# Patient Record
Sex: Male | Born: 1935 | Race: White | Hispanic: No | Smoking: Former smoker
Health system: Southern US, Community
[De-identification: ages and names within clinical notes are randomized; demographics above are authoritative.]

## PROBLEM LIST (undated history)

## (undated) DIAGNOSIS — Z9889 Other specified postprocedural states: Secondary | ICD-10-CM

## (undated) DIAGNOSIS — E7800 Pure hypercholesterolemia, unspecified: Secondary | ICD-10-CM

## (undated) DIAGNOSIS — I2510 Atherosclerotic heart disease of native coronary artery without angina pectoris: Secondary | ICD-10-CM

## (undated) DIAGNOSIS — G8929 Other chronic pain: Secondary | ICD-10-CM

## (undated) DIAGNOSIS — E039 Hypothyroidism, unspecified: Secondary | ICD-10-CM

## (undated) DIAGNOSIS — R0609 Other forms of dyspnea: Secondary | ICD-10-CM

## (undated) DIAGNOSIS — C61 Malignant neoplasm of prostate: Secondary | ICD-10-CM

## (undated) DIAGNOSIS — R296 Repeated falls: Secondary | ICD-10-CM

## (undated) DIAGNOSIS — M1990 Unspecified osteoarthritis, unspecified site: Secondary | ICD-10-CM

## (undated) DIAGNOSIS — M545 Low back pain, unspecified: Secondary | ICD-10-CM

## (undated) DIAGNOSIS — G4733 Obstructive sleep apnea (adult) (pediatric): Secondary | ICD-10-CM

## (undated) DIAGNOSIS — I10 Essential (primary) hypertension: Secondary | ICD-10-CM

## (undated) DIAGNOSIS — J449 Chronic obstructive pulmonary disease, unspecified: Secondary | ICD-10-CM

## (undated) DIAGNOSIS — F419 Anxiety disorder, unspecified: Secondary | ICD-10-CM

## (undated) DIAGNOSIS — M109 Gout, unspecified: Secondary | ICD-10-CM

## (undated) DIAGNOSIS — Z9289 Personal history of other medical treatment: Secondary | ICD-10-CM

## (undated) DIAGNOSIS — R32 Unspecified urinary incontinence: Secondary | ICD-10-CM

## (undated) DIAGNOSIS — F99 Mental disorder, not otherwise specified: Secondary | ICD-10-CM

## (undated) HISTORY — PX: PROSTATECTOMY: SHX69

## (undated) HISTORY — PX: APPENDECTOMY: SHX54

## (undated) HISTORY — PX: CATARACT EXTRACTION W/ INTRAOCULAR LENS  IMPLANT, BILATERAL: SHX1307

## (undated) HISTORY — PX: HERNIA REPAIR: SHX51

## (undated) HISTORY — PX: CORONARY ARTERY BYPASS GRAFT: SHX141

---

## 1986-10-29 DIAGNOSIS — C61 Malignant neoplasm of prostate: Secondary | ICD-10-CM

## 1986-10-29 HISTORY — DX: Malignant neoplasm of prostate: C61

## 1992-10-29 DIAGNOSIS — Z9889 Other specified postprocedural states: Secondary | ICD-10-CM

## 1992-10-29 HISTORY — DX: Other specified postprocedural states: Z98.890

## 1998-09-14 ENCOUNTER — Encounter: Payer: Self-pay | Admitting: Surgery

## 1998-09-16 ENCOUNTER — Ambulatory Visit (HOSPITAL_COMMUNITY): Admission: RE | Admit: 1998-09-16 | Discharge: 1998-09-17 | Payer: Self-pay | Admitting: Surgery

## 1999-01-06 ENCOUNTER — Ambulatory Visit (HOSPITAL_COMMUNITY): Admission: RE | Admit: 1999-01-06 | Discharge: 1999-01-06 | Payer: Self-pay | Admitting: Internal Medicine

## 1999-10-30 HISTORY — PX: ORCHIECTOMY: SHX2116

## 1999-10-30 HISTORY — PX: CIRCUMCISION: SUR203

## 1999-10-30 HISTORY — PX: PENILE PROSTHESIS  REMOVAL: SHX2202

## 1999-12-14 ENCOUNTER — Encounter: Admission: RE | Admit: 1999-12-14 | Discharge: 1999-12-14 | Payer: Self-pay | Admitting: Urology

## 1999-12-14 ENCOUNTER — Encounter: Payer: Self-pay | Admitting: Urology

## 2000-07-29 ENCOUNTER — Encounter (INDEPENDENT_AMBULATORY_CARE_PROVIDER_SITE_OTHER): Payer: Self-pay | Admitting: Specialist

## 2000-07-29 ENCOUNTER — Observation Stay (HOSPITAL_COMMUNITY): Admission: RE | Admit: 2000-07-29 | Discharge: 2000-07-30 | Payer: Self-pay | Admitting: Urology

## 2001-08-25 ENCOUNTER — Encounter (INDEPENDENT_AMBULATORY_CARE_PROVIDER_SITE_OTHER): Payer: Self-pay

## 2001-08-25 ENCOUNTER — Ambulatory Visit (HOSPITAL_COMMUNITY): Admission: RE | Admit: 2001-08-25 | Discharge: 2001-08-25 | Payer: Self-pay | Admitting: Gastroenterology

## 2002-10-19 ENCOUNTER — Encounter: Payer: Self-pay | Admitting: Urology

## 2002-10-19 ENCOUNTER — Encounter: Admission: RE | Admit: 2002-10-19 | Discharge: 2002-10-19 | Payer: Self-pay | Admitting: Urology

## 2002-10-29 HISTORY — PX: CARDIAC VALVE REPLACEMENT: SHX585

## 2003-05-01 ENCOUNTER — Inpatient Hospital Stay (HOSPITAL_COMMUNITY): Admission: AD | Admit: 2003-05-01 | Discharge: 2003-05-16 | Payer: Self-pay | Admitting: Interventional Cardiology

## 2003-05-05 ENCOUNTER — Encounter: Payer: Self-pay | Admitting: Surgery

## 2003-05-06 ENCOUNTER — Encounter: Payer: Self-pay | Admitting: Surgery

## 2003-05-06 ENCOUNTER — Encounter (INDEPENDENT_AMBULATORY_CARE_PROVIDER_SITE_OTHER): Payer: Self-pay | Admitting: Specialist

## 2003-05-07 ENCOUNTER — Encounter: Payer: Self-pay | Admitting: Surgery

## 2003-05-08 ENCOUNTER — Encounter: Payer: Self-pay | Admitting: Surgery

## 2003-05-25 ENCOUNTER — Encounter: Admission: RE | Admit: 2003-05-25 | Discharge: 2003-05-25 | Payer: Self-pay | Admitting: Surgery

## 2003-05-25 ENCOUNTER — Encounter: Payer: Self-pay | Admitting: Surgery

## 2003-06-07 ENCOUNTER — Encounter (HOSPITAL_COMMUNITY): Admission: RE | Admit: 2003-06-07 | Discharge: 2003-09-05 | Payer: Self-pay | Admitting: Interventional Cardiology

## 2003-09-06 ENCOUNTER — Encounter (HOSPITAL_COMMUNITY): Admission: RE | Admit: 2003-09-06 | Discharge: 2003-12-05 | Payer: Self-pay | Admitting: Interventional Cardiology

## 2003-09-14 ENCOUNTER — Emergency Department (HOSPITAL_COMMUNITY): Admission: EM | Admit: 2003-09-14 | Discharge: 2003-09-14 | Payer: Self-pay | Admitting: Emergency Medicine

## 2003-09-24 ENCOUNTER — Emergency Department (HOSPITAL_COMMUNITY): Admission: EM | Admit: 2003-09-24 | Discharge: 2003-09-24 | Payer: Self-pay | Admitting: Emergency Medicine

## 2003-09-25 ENCOUNTER — Ambulatory Visit (HOSPITAL_COMMUNITY): Admission: RE | Admit: 2003-09-25 | Discharge: 2003-09-25 | Payer: Self-pay | Admitting: Neurology

## 2003-11-23 ENCOUNTER — Ambulatory Visit (HOSPITAL_COMMUNITY): Admission: RE | Admit: 2003-11-23 | Discharge: 2003-11-23 | Payer: Self-pay | Admitting: Urology

## 2003-11-26 ENCOUNTER — Encounter: Admission: RE | Admit: 2003-11-26 | Discharge: 2003-11-26 | Payer: Self-pay | Admitting: Internal Medicine

## 2004-05-15 ENCOUNTER — Ambulatory Visit: Admission: RE | Admit: 2004-05-15 | Discharge: 2004-07-13 | Payer: Self-pay

## 2004-08-23 ENCOUNTER — Ambulatory Visit (HOSPITAL_BASED_OUTPATIENT_CLINIC_OR_DEPARTMENT_OTHER): Admission: RE | Admit: 2004-08-23 | Discharge: 2004-08-23 | Payer: Self-pay | Admitting: Neurology

## 2004-12-01 ENCOUNTER — Ambulatory Visit (HOSPITAL_COMMUNITY): Admission: RE | Admit: 2004-12-01 | Discharge: 2004-12-01 | Payer: Self-pay | Admitting: Urology

## 2005-04-08 ENCOUNTER — Encounter: Admission: RE | Admit: 2005-04-08 | Discharge: 2005-04-08 | Payer: Self-pay | Admitting: Internal Medicine

## 2005-06-26 ENCOUNTER — Inpatient Hospital Stay (HOSPITAL_COMMUNITY): Admission: EM | Admit: 2005-06-26 | Discharge: 2005-07-03 | Payer: Self-pay | Admitting: Emergency Medicine

## 2005-06-27 ENCOUNTER — Encounter (INDEPENDENT_AMBULATORY_CARE_PROVIDER_SITE_OTHER): Payer: Self-pay | Admitting: Cardiology

## 2005-07-11 ENCOUNTER — Encounter: Admission: RE | Admit: 2005-07-11 | Discharge: 2005-07-24 | Payer: Self-pay | Admitting: Emergency Medicine

## 2006-10-04 ENCOUNTER — Encounter: Admission: RE | Admit: 2006-10-04 | Discharge: 2006-10-04 | Payer: Self-pay | Admitting: Neurology

## 2006-11-28 ENCOUNTER — Encounter: Admission: RE | Admit: 2006-11-28 | Discharge: 2006-12-25 | Payer: Self-pay | Admitting: Neurology

## 2006-12-26 ENCOUNTER — Encounter: Admission: RE | Admit: 2006-12-26 | Discharge: 2007-01-22 | Payer: Self-pay | Admitting: Neurology

## 2007-01-23 ENCOUNTER — Encounter: Admission: RE | Admit: 2007-01-23 | Discharge: 2007-02-13 | Payer: Self-pay | Admitting: Neurology

## 2007-06-03 ENCOUNTER — Encounter: Admission: RE | Admit: 2007-06-03 | Discharge: 2007-09-01 | Payer: Self-pay | Admitting: Neurology

## 2008-01-13 ENCOUNTER — Encounter: Admission: RE | Admit: 2008-01-13 | Discharge: 2008-04-12 | Payer: Self-pay | Admitting: Internal Medicine

## 2008-02-20 ENCOUNTER — Encounter: Payer: Self-pay | Admitting: Internal Medicine

## 2008-02-24 ENCOUNTER — Encounter: Payer: Self-pay | Admitting: Internal Medicine

## 2008-02-24 ENCOUNTER — Ambulatory Visit (HOSPITAL_COMMUNITY): Admission: RE | Admit: 2008-02-24 | Discharge: 2008-02-24 | Payer: Self-pay | Admitting: Interventional Cardiology

## 2008-03-19 ENCOUNTER — Ambulatory Visit: Payer: Self-pay | Admitting: Internal Medicine

## 2008-03-19 DIAGNOSIS — I10 Essential (primary) hypertension: Secondary | ICD-10-CM

## 2008-03-19 DIAGNOSIS — I209 Angina pectoris, unspecified: Secondary | ICD-10-CM

## 2008-03-19 DIAGNOSIS — R0609 Other forms of dyspnea: Secondary | ICD-10-CM

## 2008-03-19 DIAGNOSIS — E785 Hyperlipidemia, unspecified: Secondary | ICD-10-CM | POA: Insufficient documentation

## 2008-03-19 DIAGNOSIS — R0989 Other specified symptoms and signs involving the circulatory and respiratory systems: Secondary | ICD-10-CM

## 2008-03-19 DIAGNOSIS — Z8679 Personal history of other diseases of the circulatory system: Secondary | ICD-10-CM | POA: Insufficient documentation

## 2008-03-26 ENCOUNTER — Ambulatory Visit: Payer: Self-pay | Admitting: Internal Medicine

## 2008-03-29 LAB — CONVERTED CEMR LAB
BUN: 18 mg/dL (ref 6–23)
Basophils Relative: 2 % — ABNORMAL HIGH (ref 0.0–1.0)
Calcium: 9 mg/dL (ref 8.4–10.5)
Creatinine, Ser: 1.1 mg/dL (ref 0.4–1.5)
Eosinophils Relative: 11 % — ABNORMAL HIGH (ref 0.0–5.0)
GFR calc Af Amer: 82 mL/min
Glucose, Bld: 97 mg/dL (ref 70–99)
Lymphocytes Relative: 21 % (ref 12.0–46.0)
Monocytes Relative: 6 % (ref 3.0–12.0)
Neutrophils Relative %: 60 % (ref 43.0–77.0)
Potassium: 5 meq/L (ref 3.5–5.1)
RBC: 4.02 M/uL — ABNORMAL LOW (ref 4.22–5.81)
WBC: 11 10*3/uL — ABNORMAL HIGH (ref 4.5–10.5)

## 2008-04-12 ENCOUNTER — Ambulatory Visit: Payer: Self-pay | Admitting: Internal Medicine

## 2008-04-12 DIAGNOSIS — R05 Cough: Secondary | ICD-10-CM

## 2008-04-15 ENCOUNTER — Ambulatory Visit: Payer: Self-pay | Admitting: Cardiology

## 2008-04-26 ENCOUNTER — Ambulatory Visit: Payer: Self-pay | Admitting: Internal Medicine

## 2008-05-19 ENCOUNTER — Encounter: Admission: RE | Admit: 2008-05-19 | Discharge: 2008-05-19 | Payer: Self-pay | Admitting: Geriatric Medicine

## 2008-08-18 ENCOUNTER — Encounter: Admission: RE | Admit: 2008-08-18 | Discharge: 2008-08-18 | Payer: Self-pay | Admitting: Interventional Cardiology

## 2008-08-26 ENCOUNTER — Ambulatory Visit: Payer: Self-pay | Admitting: Surgery

## 2008-09-21 ENCOUNTER — Ambulatory Visit: Payer: Self-pay | Admitting: Surgery

## 2008-09-21 ENCOUNTER — Ambulatory Visit (HOSPITAL_COMMUNITY): Admission: RE | Admit: 2008-09-21 | Discharge: 2008-09-21 | Payer: Self-pay | Admitting: Surgery

## 2008-09-29 ENCOUNTER — Inpatient Hospital Stay (HOSPITAL_COMMUNITY): Admission: RE | Admit: 2008-09-29 | Discharge: 2008-10-07 | Payer: Self-pay | Admitting: Surgery

## 2008-10-01 ENCOUNTER — Ambulatory Visit: Payer: Self-pay | Admitting: Physical Medicine & Rehabilitation

## 2008-11-01 ENCOUNTER — Ambulatory Visit: Payer: Self-pay | Admitting: Surgery

## 2008-11-01 ENCOUNTER — Encounter: Admission: RE | Admit: 2008-11-01 | Discharge: 2008-11-01 | Payer: Self-pay | Admitting: Surgery

## 2008-11-11 ENCOUNTER — Emergency Department (HOSPITAL_COMMUNITY): Admission: EM | Admit: 2008-11-11 | Discharge: 2008-11-11 | Payer: Self-pay | Admitting: Emergency Medicine

## 2009-02-07 ENCOUNTER — Encounter: Admission: RE | Admit: 2009-02-07 | Discharge: 2009-02-07 | Payer: Self-pay | Admitting: Surgery

## 2009-02-07 ENCOUNTER — Ambulatory Visit: Payer: Self-pay | Admitting: Surgery

## 2009-03-03 ENCOUNTER — Encounter: Admission: RE | Admit: 2009-03-03 | Discharge: 2009-03-03 | Payer: Self-pay | Admitting: Interventional Cardiology

## 2009-03-07 ENCOUNTER — Inpatient Hospital Stay (HOSPITAL_BASED_OUTPATIENT_CLINIC_OR_DEPARTMENT_OTHER): Admission: RE | Admit: 2009-03-07 | Discharge: 2009-03-07 | Payer: Self-pay | Admitting: Interventional Cardiology

## 2009-03-10 ENCOUNTER — Encounter: Admission: RE | Admit: 2009-03-10 | Discharge: 2009-03-10 | Payer: Self-pay | Admitting: Interventional Cardiology

## 2009-03-24 ENCOUNTER — Ambulatory Visit (HOSPITAL_COMMUNITY): Admission: RE | Admit: 2009-03-24 | Discharge: 2009-03-24 | Payer: Self-pay | Admitting: Interventional Cardiology

## 2009-05-05 ENCOUNTER — Inpatient Hospital Stay (HOSPITAL_COMMUNITY): Admission: AD | Admit: 2009-05-05 | Discharge: 2009-05-06 | Payer: Self-pay | Admitting: Internal Medicine

## 2009-05-05 ENCOUNTER — Encounter (INDEPENDENT_AMBULATORY_CARE_PROVIDER_SITE_OTHER): Payer: Self-pay | Admitting: Internal Medicine

## 2009-05-05 ENCOUNTER — Ambulatory Visit: Payer: Self-pay | Admitting: Surgery

## 2009-05-09 ENCOUNTER — Ambulatory Visit: Payer: Self-pay | Admitting: Surgery

## 2009-08-12 ENCOUNTER — Encounter: Admission: RE | Admit: 2009-08-12 | Discharge: 2009-08-12 | Payer: Self-pay | Admitting: Surgery

## 2009-08-15 ENCOUNTER — Ambulatory Visit: Payer: Self-pay | Admitting: Surgery

## 2009-12-23 ENCOUNTER — Inpatient Hospital Stay (HOSPITAL_COMMUNITY): Admission: AD | Admit: 2009-12-23 | Discharge: 2009-12-27 | Payer: Self-pay | Admitting: Internal Medicine

## 2010-05-17 ENCOUNTER — Ambulatory Visit: Payer: Self-pay | Admitting: Internal Medicine

## 2010-06-19 ENCOUNTER — Telehealth: Payer: Self-pay | Admitting: Internal Medicine

## 2010-06-27 ENCOUNTER — Ambulatory Visit: Payer: Self-pay | Admitting: Cardiovascular Disease

## 2010-06-27 ENCOUNTER — Inpatient Hospital Stay (HOSPITAL_COMMUNITY)
Admission: EM | Admit: 2010-06-27 | Discharge: 2010-07-07 | Disposition: A | Discharge status: Rehabilitation Facility | Payer: Self-pay | Source: Home / Self Care | Admitting: Emergency Medicine

## 2010-06-28 ENCOUNTER — Encounter (INDEPENDENT_AMBULATORY_CARE_PROVIDER_SITE_OTHER): Payer: Self-pay | Admitting: Internal Medicine

## 2010-07-06 ENCOUNTER — Ambulatory Visit: Payer: Self-pay | Admitting: Physical Medicine & Rehabilitation

## 2010-07-07 ENCOUNTER — Ambulatory Visit: Payer: Self-pay | Admitting: Physical Medicine & Rehabilitation

## 2010-07-07 ENCOUNTER — Inpatient Hospital Stay (HOSPITAL_COMMUNITY)
Admission: RE | Admit: 2010-07-07 | Discharge: 2010-07-17 | Payer: Self-pay | Admitting: Physical Medicine & Rehabilitation

## 2010-07-17 ENCOUNTER — Ambulatory Visit: Payer: Self-pay | Admitting: Critical Care Medicine

## 2010-07-17 ENCOUNTER — Inpatient Hospital Stay (HOSPITAL_COMMUNITY): Admission: AD | Admit: 2010-07-17 | Discharge: 2010-07-26 | Payer: Self-pay | Admitting: Critical Care Medicine

## 2010-07-18 ENCOUNTER — Encounter: Payer: Self-pay | Admitting: Critical Care Medicine

## 2010-11-01 ENCOUNTER — Ambulatory Visit
Admission: RE | Admit: 2010-11-01 | Discharge: 2010-11-01 | Payer: Self-pay | Source: Home / Self Care | Attending: Internal Medicine | Admitting: Internal Medicine

## 2010-11-17 ENCOUNTER — Ambulatory Visit
Admission: RE | Admit: 2010-11-17 | Discharge: 2010-11-17 | Payer: Self-pay | Source: Home / Self Care | Attending: Internal Medicine | Admitting: Internal Medicine

## 2010-11-18 ENCOUNTER — Encounter: Payer: Self-pay | Admitting: Surgery

## 2010-11-19 ENCOUNTER — Encounter: Payer: Self-pay | Admitting: Surgery

## 2010-11-28 NOTE — Assessment & Plan Note (Signed)
Summary: Pulmonary/ ext ov f/u sob/cough   Primary Provider/Referring Provider:  Kevan Ny  CC:  Acute visit.  Pt c/o cough x 2-3 wks- worse at bedtime when lies down.  Cough is mainly dry- sometimes prod with minimal clear to white sputum.  He also c/o SOB with exertion such as walking approx 50 ft.  He sleeps all day and has no energy.  Marland Kitchen  History of Present Illness: 85  yowm quit smoking 1990  history of HTN. Seen 03/19/08   for increased DOE over last 6 months. Pt complains that he has to  stops half way to mailbox to catch breath x years especially last 6 month plus dry cough ? better with advair sleeping ok except for leg cramps which also occur durng walk, = bilaterally. No activity desaturations last visit. PFT  wnl  - no evidence of airflow obst . Advair was stopped. and trial of Kapidex for possible occult LPR/Gerd for hoarseness and cough was rec   03/26/08  ov for med review. Confused last visit with meds. continues to have DOE, leg cramps and leg give out on him with activity.  rec short course of prednisone and as needed tramadol for cough    04/12/08  given short course of prednisone complete 6/23 plus  tramadol and it stopped and hasn't come back and no longer needing any cough suppressants on ppi daily. rec f/u as needed.  May 17, 2010 ov Acute visit.  Pt c/o cough x 2-3 wks- worse after supper at bedtime when lies down.  Cough is mainly dry- sometimes prod with minimal clear to white sputum.  He also c/o SOB with exertion going from house to MB which is about 100 ft and no different x sev years.   Eval by Dr Kevan Ny with cxr "ok" .  previously better transiently on prednisone     Current Medications (verified): 1)  Oscal 500/200 D-3 500-200 Mg-Unit  Tabs (Calcium-Vitamin D) .... Two Times A Day 2)  Allopurinol 100 Mg  Tabs (Allopurinol) .... Once Daily 3)  Adult Aspirin Low Strength 81 Mg  Tbdp (Aspirin) .... Once Daily 4)  Cymbalta 30 Mg  Cpep (Duloxetine Hcl) .... Two Times A  Day 5)  Namenda 10 Mg  Tabs (Memantine Hcl) .... Two Times A Day 6)  Zetia 10 Mg  Tabs (Ezetimibe) .... Take 1 Tablet By Mouth Once A Day 7)  Glycopyrrolate 2 Mg  Tabs (Glycopyrrolate) .... Take 1 Tab By Mouth At Bedtime 8)  Megestrol Acetate 40 Mg  Tabs (Megestrol Acetate) .... Take 1 Tablet By Mouth Once A Day As Needed 9)  Aricept 10 Mg  Tabs (Donepezil Hcl) .... Once Daily 10)  Qualaquin 324 Mg  Caps (Quinine Sulfate) .... Take 1 Tablet By Mouth Once A Day As Needed 11)  Valium 10 Mg Tabs (Diazepam) .Marland Kitchen.. 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 wt  189 > 211 May 17, 2010  HEENT: nl dentition, turbinates, and orophanx. Nl external ear canals without cough reflex Neck without JVD/Nodes/TM Lungs clear to A and P bilaterally without cough on insp or exp maneuvers RRR no s3 or murmur or increase in P2 Abd soft and benign with nl excursion in the supine position. No bruits or organomegaly Ext warm without calf tenderness, cyanosis clubbing or edema Skin warm and dry without lesions     Impression & Recommendations:  Problem # 1:  COUGH (ICD-786.2)  The most common causes of chronic cough in immunocompetent adults include: upper airway cough syndrome (UACS), previously referred to as postnasal drip syndrome,  caused by variety of  rhinosinus conditions; (2) asthma; (3) GERD; (4) chronic bronchitis from cigarette smoking or other inhaled environmental irritants; (5) nonasthmatic eosinophilic bronchitis; and (6) bronchiectasis. These conditions, singly or in combination, have accounted for up to 94% of the causes of chronic cough in prospective studies.   Based on previous eval this is likely  Classic Upper airway cough syndrome, so named because it's frequently impossible to sort out how much is  CR/sinusitis with freq throat clearing (which can be related to primary GERD)   vs  causing  secondary extra esophageal GERD from wide swings in gastric pressure that occur with throat clearing, promoting self use of mint and menthol lozenges that reduce the lower esophageal sphincter tone and exacerbate the problem further These are the same pts who not infrequently have failed to tolerate ace inhibitors,  dry powder inhalers or biphosphonates or report having reflux symptoms that don't respond to standard doses of PPI  Rec See instructions for specific recommendations   The standardized cough guidelines recently published in Chest are a 14 step process, not a single office visit,  and are intended  to address this problem logically,  with an alogrithm dependent on response to each progressive step  to determine a specific diagnosis with  minimal addtional testing needed. Therefore if compliance is an issue this empiric standardized approach simply won't work.   Orders: Est. Patient Level IV (14782) Prescription Created Electronically [id number])  Problem # 2:  DYSPNEA (ICD-786.09) Prev pft's nl and this was attributed to geriatric decline and note can now get to MB without stopping which is better than he was 2 years ago so work on the cough first then regroup with walking sat on next ov  Medications Added to Medication List This Visit: 1)  Megestrol Acetate 40 Mg Tabs (Megestrol acetate) .... Take 1 tablet by mouth once a day as  needed 2)  Valium 10 Mg Tabs (Diazepam) .Marland Kitchen.. 1 at bedtime 3)  Prednisone 10 Mg Tabs (Prednisone) .... 4 each am x 2days, 2x2days, 1x2days and stop  Patient Instructions: 1)  Prednisone 10 mg  4 each am x 2days, 2x2days, 1x2days and stop  2)  Add chortrimeton 4 mg at bedtime and pepcid 20 mg at bedtime 3)  Please schedule a follow-up appointment in 4 weeks, sooner if needed. 4)  GERD (REFLUX)  is a common cause of respiratory symptoms. It commonly presents without heartburn and can be treated with medication, but also with lifestyle changes including avoidance of late meals, excessive alcohol, smoking cessation, and avoid fatty  foods, chocolate, peppermint, colas, red wine, and acidic juices such as orange juice. NO MINT OR MENTHOL PRODUCTS SO NO COUGH DROPS  5)  USE SUGARLESS CANDY INSTEAD (jolley ranchers)  6)  NO OIL BASED VITAMINS  7)  Unlike when you get a prescription for eyeglasses, it's not possible to always walk out of this or any medical office with a perfect prescription that is immediately effective  based on any test that we offer here.  On the contrary, it may take several weeks for the full impact of changes recommened today - hopefully you will respond well.  If not, then we'll adjust your medication on your next visit accordingly, knowing more then than we can possibly know now.  8)  Please schedule a follow-up appointment in 4 weeks, sooner if needed    Prescriptions: PREDNISONE 10 MG  TABS (PREDNISONE) 4 each am x 2days, 2x2days, 1x2days and stop  #14 x 0   Entered and Authorized by:   Nyoka Cowden MD   Signed by:   Nyoka Cowden MD on 05/17/2010   Method used:   [facility]        [facility]  [address] [phone][facility] (retail)       [address]       [place], [place]  [id number]       Ph: [phone] or [phone]       Fax: [phone]   RxID:   [id number]

## 2010-11-28 NOTE — Progress Notes (Signed)
Summary: nos appt  Phone Note Call from Patient   Caller: Logan Allen  Call For: Logan Allen Summary of Call: In ref to nos from 8/19, pt states he will call to rsc. Initial call taken by: Darletta Moll,  June 19, 2010 9:58 AM     Appended Document: nos appt ok - chart review does not show recent cxr which I recommend be done this year so if not returning here make sure he sees Dr Kevan Ny for follow up, especially if still having symptoms  Appended Document: nos appt [name], not Shaune Pollack, is the referring physician  Appended Document: nos appt Spoke with pt.  He states that he is aware needs appt.  I advised that if not done here, needs cxr at Dr Kevan Ny office.  He states that he would like to come here for appt, but will have to call us back to get this sched for later this wk.  Pt states will call back tommorrow.

## 2010-11-30 NOTE — Assessment & Plan Note (Signed)
Summary: Pulmonary/ acute ext ov    Primary Provider/Referring Provider:  Kevan Ny  CC:  Dry cough .  History of Present Illness: 46  yowm quit smoking 1990  history of HTN. Seen 03/19/08  for increased DOE x  6 months to point where has  to  stop  half way to mailbox to catch breath x years  plus dry cough ? better with advair sleeping ok except for leg cramps which also occur durng walk, = bilaterally. No activity desaturations last visit. PFT  wnl  - no evidence of airflow obst . Advair was stopped. and trial of Kapidex for possible occult LPR/Gerd for hoarseness and cough was rec   03/26/08  ov for med review. Confused last visit with meds. continues to have DOE, leg cramps and leg give out on him with activity.  rec short course of prednisone and as needed tramadol for cough   04/12/08  given short course of prednisone complete 6/23 plus  tramadol and it stopped and hasn't come back and no longer needing any cough suppressants on ppi daily. rec f/u as needed.  May 17, 2010 ov Acute visit.  Pt c/o cough worse  x 2-3 wks- worse after supper at bedtime when lies down.  Cough is mainly dry- sometimes prod with minimal clear to white sputum.  He also c/o SOB with exertion going from house to MB which is about 100 ft and no different x sev years.   Eval by Dr Kevan Ny with cxr "ok" .  previously better transiently on prednisone. rec add h1   November 01, 2010 ov cc dry cough no better, using neb, only reprieve is around 7-9am daily, overall worse x one month, sinuses are fine and no trouble swallowing food, had G tube but now out. Pt denies any significant sore throat, dysphagia, itching, sneezing,  nasal congestion or excess secretions,  fever, chills, sweats, unintended wt loss, pleuritic or exertional cp, hempoptysis, change in activity tolerance  orthopnea pnd or leg swelling. Pt also denies any obvious fluctuation in symptoms with weather or environmental change or other alleviating or aggravating  factors.         Current Medications (verified): 1)  Allopurinol 100 Mg  Tabs (Allopurinol) .... Once Daily 2)  Adult Aspirin Low Strength 81 Mg  Tbdp (Aspirin) .... Once Daily 3)  Cymbalta 30 Mg  Cpep (Duloxetine Hcl) .... Two Times A Day 4)  Namenda 10 Mg  Tabs (Memantine Hcl) .... Two Times A Day 5)  Zetia 10 Mg  Tabs (Ezetimibe) .... Take 1 Tablet By Mouth Once A Day 6)  Aricept 10 Mg  Tabs (Donepezil Hcl) .... Once Daily 7)  Qualaquin 324 Mg  Caps (Quinine Sulfate) .... Take 1 Tablet By Mouth Once A Day As Needed 8)  Acetaminophen 325 Mg Tabs (Acetaminophen) .... 2 Every 4 Hrs As Needed 9)  Tramadol Hcl 50 Mg Tabs (Tramadol Hcl) .... 1/2 Every 6 Hrs As Needed For Pain 10)  Bicalutamide 50 Mg Tabs (Bicalutamide) .Marland Kitchen.. 1 Once Daily 11)  Calcium + D 600-200 Mg-Unit Tabs (Calcium Carbonate-Vitamin D) .Marland Kitchen.. 1 Two Times A Day 12)  Mucinex 600 Mg Xr12h-Tab (Guaifenesin) .Marland Kitchen.. 1  Two Times A Day 13)  Lumigan 0.01 % Soln (Bimatoprost) .Marland Kitchen.. 1 Drop Each Eye At Bedtime 14)  Oxybutynin Chloride 15 Mg Xr24h-Tab (Oxybutynin Chloride) .Marland Kitchen.. 1 Once Daily 15)  Klor-Con M20 20 Meq Cr-Tabs (Potassium Chloride Crys Cr) .Marland Kitchen.. 1 Once Daily 16)  Pramipexole Dihydrochloride 0.25 Mg  Tabs (Pramipexole Dihydrochloride) .Marland Kitchen.. 1 Two Times A Day 17)  Quinidine Gluconate Cr 324 Mg Cr-Tabs (Quinidine Gluconate) .Marland Kitchen.. 1 Once Daily 18)  Robitussin Dm 100-10 Mg/55ml Syrp (Dextromethorphan-Guaifenesin) .... 2 Tsp Every 6 Hrs As Needed 19)  Tamsulosin Hcl 0.4 Mg Caps (Tamsulosin Hcl) .Marland Kitchen.. 1 Once Daily 20)  Ipratropium-Albuterol 0.5-2.5 (3) Mg/66ml Soln (Ipratropium-Albuterol) .Marland Kitchen.. 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 no asthma or resp dz  Social History: Reviewed history from 03/19/2008 and no changes required. Patient states former smoker.  He smoked for 45 years and quit about 1988.  He smoked about 2 ppd. He is retired from [employer].  Vital Signs:  Patient profile:   77 year old male Weight:      194.13 pounds O2 Sat:      92 % on Room air Temp:     97.7 degrees F oral Pulse rate:   69 / minute  Vitals Entered By: Vernie Murders (November 01, 2010 11:18 AM)  O2 Flow:  Room air  Physical Exam  Additional Exam:  Elderly wm,  frail wc  bound wm with classic pseudowheeze resolves with purse lip maneuver  wt  189 > 211 May 17, 2010 > wt 194 November 01, 2010  HEENT: nl dentition, turbinates, and orophanx. Nl external ear canals without cough reflex Neck without JVD/Nodes/TM Lungs prominent pseudowheeze with dry crackles on insp RRR no s3 or murmur or increase in P2 Abd soft and benign with nl excursion in the supine position. No bruits or organomegaly Ext warm without calf tenderness, cyanosis clubbing or edema Skin warm and dry without lesions     Impression & Recommendations:  Problem # 1:  COUGH (ICD-786.2)  The most common causes of chronic cough in immunocompetent adults include: upper airway cough syndrome (UACS), previously referred to as postnasal drip syndrome,  caused by variety of rhinosinus conditions; (2) asthma; (3) GERD; (4) chronic bronchitis from cigarette smoking or other inhaled environmental irritants; (5) nonasthmatic eosinophilic bronchitis; and (6) bronchiectasis. These conditions, singly or in combination, have accounted for up to 94% of the causes of chronic cough in prospective studies.   The abn cxr suggests either ILD or bronchiectasis but both may be related to chronic GERD and Of the three most common causes of chronic cough, only one (GERD) can actually cause the other two (asthma and UACS) and perpetuate the cylce  of cough inducing airway trauma, inflammation, heightened sensitivity to reflux which is prompted by the cough itself via a cyclical mechanism.  This may partially respond to steroids and look like asthma and post nasal drainage but never erradicated completely unless the cough and the secondary reflux are eliminated, preferably both at the same time.     The standardized cough guidelines recently published in Chest are a 14 step process, not a single office visit,  and are intended  to address this problem logically,  with an alogrithm dependent on response to each progressive step  to determine a specific diagnosis with  minimal addtional testing needed. Therefore if compliance is an issue this empiric standardized approach simply won't work.   See instructions for specific recommendations  Medications Added to Medication List This Visit: 1)  Acetaminophen 325 Mg Tabs (Acetaminophen) .... 2 every 4 hrs as needed 2)  Tramadol Hcl 50 Mg Tabs (Tramadol hcl) .... 1/2 every 6 hrs as needed for pain 3)  Bicalutamide 50 Mg Tabs (Bicalutamide) .Marland Kitchen.. 1 once daily 4)  Calcium + D 600-200 Mg-unit Tabs (Calcium carbonate-vitamin d) .Marland Kitchen.. 1 two times a day 5)  Mucinex 600 Mg Xr12h-tab (Guaifenesin) .Marland Kitchen.. 1  two times a day 6)  Lumigan 0.01 % Soln (Bimatoprost) .Marland Kitchen.. 1 drop each eye at bedtime 7)  Oxybutynin Chloride 15 Mg Xr24h-tab (Oxybutynin chloride) .Marland Kitchen.. 1 once daily 8)  Klor-con M20 20 Meq Cr-tabs (Potassium chloride crys cr) .Marland Kitchen.. 1 once daily 9)  Pramipexole Dihydrochloride 0.25 Mg Tabs (Pramipexole dihydrochloride) .Marland Kitchen.. 1 two times a day 10)  Quinidine Gluconate Cr 324 Mg Cr-tabs (Quinidine gluconate) .Marland Kitchen.. 1 once daily 11)  Robitussin Dm 100-10 Mg/16ml Syrp (Dextromethorphan-guaifenesin) .... 2 tsp every 6 hrs as needed 12)  Tamsulosin Hcl 0.4 Mg Caps (Tamsulosin hcl) .Marland Kitchen.. 1 once daily 13)  Ipratropium-albuterol 0.5-2.5 (3) Mg/42ml Soln (Ipratropium-albuterol) .Marland Kitchen.. 1 vial in nebulizer every 6 hrs as  needed 14)  Pepcid 20 Mg Tabs (Famotidine) .... Take one by mouth at bedtime 15)  Prilosec Otc 20 Mg Tbec (Omeprazole magnesium) .... Take one 30-60 min before first and last meals of the day 16)  Chlor-trimeton 4 Mg Tabs (Chlorpheniramine maleate) .... One at bedtime 17)  Prednisone 10 Mg Tabs (Prednisone) .... 4 each am x 2 days,  3 x 2days, 2x2 days, and 1x2 days  Other Orders: T-2 View CXR (71020TC) Est. Patient Level IV (75643)  Patient Instructions: 1)  GERD (REFLUX)  is a common cause of respiratory symptoms. It commonly presents without heartburn and can be treated with medication, but also with lifestyle changes including avoidance of late meals, excessive alcohol, smoking cessation, and avoid fatty foods, chocolate, peppermint, colas, red wine, and acidic juices such as orange juice. NO MINT OR MENTHOL PRODUCTS SO NO COUGH DROPS  2)  USE SUGARLESS CANDY INSTEAD (jolley ranchers)  3)  NO OIL BASED VITAMINS  4)  Chlortrimeton 4mg  one at bedtime 5)  Prilosec 20 mg Take one 30-60 min before first and last meals of the day and Pepcid 20 mg one at bedtime along with chlortrimeton 4mg  at bedtime 6)  Prednisone 4 each am x 2 days,  3 x 2days, 2x2 days, and 1x2 days  7)  Please schedule a follow-up appointment in 2 weeks, sooner if needed  8)     Prescriptions: PREDNISONE 10 MG  TABS (PREDNISONE) 4 each am x 2 days,  3 x 2days, 2x2 days, and 1x2 days  #20 x 0   Entered and Authorized by:   Nyoka Cowden MD   Signed by:   Nyoka Cowden MD on 11/01/2010   Method used:   Print then Give to Patient   RxID:   [id number] PREDNISONE 10 MG  TABS (PREDNISONE) 4 each am x 2 days,  3 x 2days, 2x2 days, and 1x2 days  #20 x 0   Entered and Authorized by:   Nyoka Cowden MD   Signed by:   Nyoka Cowden MD on 11/01/2010   Method used:   [facility]        [facility]  [address] [phone][facility] (retail)       [address]       [place]  [place], [place]  [id number]        Ph: [phone] or [phone]       Fax: [phone]   RxID:   [id number]  [facility]ia.  She was informed of this and to pls disregard it.  Mia verbalized understanding. Gweneth Dimitri RN  November 01, 2010 12:04 PM

## 2010-11-30 NOTE — Assessment & Plan Note (Signed)
Summary: Pulmonary/ summary f/u ov   Primary Provider/Referring Provider:  Kevan Ny  CC:  Cough- the same.  History of Present Illness: 74  yowm quit smoking 1990  history of HTN. Seen 03/19/08  for increased DOE x  6 months to point where has  to  stop  half way to mailbox to catch breath x years  plus dry cough ? better with advair sleeping ok except for leg cramps which also occur durng walk, = bilaterally. No activity desaturations last visit. PFT  wnl  - no evidence of airflow obst . Advair was stopped. and trial of Kapidex for possible occult LPR/Gerd for hoarseness and cough was rec   03/26/08  ov for med review. Confused last visit with meds. continues to have DOE, leg cramps and leg give out on him with activity.  rec short course of prednisone and as needed tramadol for cough   04/12/08  given short course of prednisone complete 6/23 plus  tramadol and it stopped and hasn't come back and no longer needing any cough suppressants on ppi daily. rec f/u as needed.  May 17, 2010 ov Acute visit.  Pt c/o cough worse  x 2-3 wks- worse after supper at bedtime when lies down.  Cough is mainly dry- sometimes prod with minimal clear to white sputum.  He also c/o SOB with exertion going from house to [place] which is about 100 ft and no different x sev years.   Eval by Dr Kevan Ny with cxr "ok" .  previously better transiently on prednisone. rec add h1   November 01, 2010 ov cc dry cough no better, using neb, only reprieve is around 7-9am daily, overall worse x one month, sinuses are fine and no trouble swallowing food, had G tube but now out. rec GERD (REFLUX)  diet Chlortrimeton 4mg  one at bedtime Prilosec 20 mg Take one 30-60 min before first and last meals of the day and Pepcid 20 mg one at bedtime along with chlortrimeton 4mg  at bedtime Prednisone 4 each am x 2 days,  3 x 2days, 2x2 days, and 1x2 days    November 17, 2010 ov cc persistent cough rattling congested doesn't wake up at night some worse early  am/  does not feel nebulizer helps, no purulent sputum, no sob, sore throat, dysphagia, itching, sneezing,  nasal congestion or excess secretions,  fever, chills, sweats, cp, leg swelling.  Current Medications (verified): 1)  Allopurinol 100 Mg  Tabs (Allopurinol) .... Once Daily 2)  Adult Aspirin Low Strength 81 Mg  Tbdp (Aspirin) .... Once Daily 3)  Cymbalta 30 Mg  Cpep (Duloxetine Hcl) .... Two Times A Day 4)  Namenda 10 Mg  Tabs (Memantine Hcl) .... Two Times A Day 5)  Zetia 10 Mg  Tabs (Ezetimibe) .... Take 1 Tablet By Mouth Once A Day 6)  Aricept 10 Mg  Tabs (Donepezil Hcl) .... Once Daily 7)  Tylenol 8 Hour 650 Mg Cr-Tabs (Acetaminophen) .Marland Kitchen.. 1 Every 4 Hrs As Needed 8)  Tramadol Hcl 50 Mg Tabs (Tramadol Hcl) .... 1/2 Every 6 Hrs As Needed For Pain 9)  Bicalutamide 50 Mg Tabs (Bicalutamide) .Marland Kitchen.. 1 Once Daily 10)  Calcium + D 600-200 Mg-Unit Tabs (Calcium Carbonate-Vitamin D) .Marland Kitchen.. 1 Two Times A Day 11)  Mucinex Dm 30-600 Mg Xr12h-Tab (Dextromethorphan-Guaifenesin) .... Per Box Directions As Needed 12)  Lumigan 0.01 % Soln (Bimatoprost) .Marland Kitchen.. 1 Drop Each Eye At Bedtime 13)  Oxybutynin Chloride 15 Mg Xr24h-Tab (Oxybutynin Chloride) .Marland Kitchen.. 1 Once  Daily 14)  Klor-Con M20 20 Meq Cr-Tabs (Potassium Chloride Crys Cr) .Marland Kitchen.. 1 Once Daily 15)  Pramipexole Dihydrochloride 0.25 Mg Tabs (Pramipexole Dihydrochloride) .Marland Kitchen.. 1 Two Times A Day 16)  Quinidine Gluconate Cr 324 Mg Cr-Tabs (Quinidine Gluconate) .Marland Kitchen.. 1 Once Daily 17)  Tamsulosin Hcl 0.4 Mg Caps (Tamsulosin Hcl) .Marland Kitchen.. 1 Once Daily 18)  Ipratropium-Albuterol 0.5-2.5 (3) Mg/21ml Soln (Ipratropium-Albuterol) .Marland Kitchen.. 1 Vial in Nebulizer Every 6 Hrs As Needed 19)  Prilosec Otc 20 Mg Tbec (Omeprazole Magnesium) .... Take One 30-60 Min Before First and Last Meals of The Day 20)  Chlor-Trimeton 4 Mg Tabs (Chlorpheniramine Maleate) .... One At Bedtime 21)  Cipro 250 Mg Tabs (Ciprofloxacin Hcl) .Marland Kitchen.. 1 Two Times A Day X 7 Days Per Dr Kevan Ny 22)  Metoprolol Tartrate  50 Mg Tabs (Metoprolol Tartrate) .Marland Kitchen.. 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) HEART MURMUR, HX OF (ICD-V12.50) HYPERTENSION (ICD-401.9) HYPERLIPIDEMIA (ICD-272.4) CHRONIC COUGH     -  Sinus CT 04/05/08  wnl Interstitial lung dz    Vital Signs:  Patient profile:   77 year old male Weight:      194 pounds O2 Sat:      91 % on Room air Temp:     97.6 degrees F oral Pulse rate:   72 / minute BP sitting:   110 / 62  (left arm)  Vitals Entered By: Vernie Murders (November 17, 2010 12:19 PM)  O2 Flow:  Room air  Physical Exam  Additional Exam:  Elderly wm,  frail wc  bound wm with classic pseudowheeze resolves with purse lip maneuver  wt  189 > 211 May 17, 2010 > wt 194 November 01, 2010 > 194 November 17, 2010  HEENT: nl dentition, turbinates, and orophanx. Nl external ear canals without cough reflex Neck without JVD/Nodes/TM Lungs prominent pseudowheeze with dry crackles on insp RRR no s3 or murmur or increase in P2 Abd soft and benign with nl excursion in the supine position. No bruits or organomegaly Ext warm without calf tenderness, cyanosis clubbing or edema Skin warm and dry without lesions     Impression & Recommendations:  Problem # 1:  COUGH (ICD-786.2)    DDX of  difficult airways managment all start with A and  include Adherence, Ace Inhibitors, Acid Reflux, Active Sinus Disease, Alpha 1 Antitripsin deficiency, Anxiety masquerading as Airways dz,  ABPA,  allergy(esp in young), Aspiration (esp in elderly), Adverse effects of DPI,  Active smokers, plus one B  = Beta blocker use..    Continue to feel asp/ acid reflux the major issues as previous pft's are nl. See instructions for specific recommendations   Problem # 2:  HYPERTENSION (ICD-401.9) Beta blockers, esp in higher doses, problematic in airways disorders. strongly prefer Bystolic, the most beta -1  selective  Beta blocker available in sample form, with bisoprolol the most selective generic choice  on the market.   Medications Added to Medication List This Visit: 1)  Tylenol 8 Hour 650 Mg Cr-tabs (Acetaminophen) .Marland Kitchen.. 1 every 4 hrs as needed 2)  Mucinex Dm 30-600 Mg Xr12h-tab (Dextromethorphan-guaifenesin) .... Per box directions as needed 3)  Cipro 250 Mg Tabs (Ciprofloxacin hcl) .Marland Kitchen.. 1 two times a day x 7 days per dr gates 4)  Metoprolol Tartrate 50 Mg Tabs (Metoprolol tartrate) .Marland Kitchen.. 1 1/2 two times a day  Other Orders: Est. Patient Level III (87564)  Patient Instructions:  1)  Return  clinic if cough gets worse 2)  Only use the nebulizer as needed for cough / wheeze/ short of breath

## 2011-01-11 LAB — GLUCOSE, CAPILLARY
Glucose-Capillary: 100 mg/dL — ABNORMAL HIGH (ref 70–99)
Glucose-Capillary: 108 mg/dL — ABNORMAL HIGH (ref 70–99)
Glucose-Capillary: 112 mg/dL — ABNORMAL HIGH (ref 70–99)
Glucose-Capillary: 114 mg/dL — ABNORMAL HIGH (ref 70–99)
Glucose-Capillary: 123 mg/dL — ABNORMAL HIGH (ref 70–99)
Glucose-Capillary: 123 mg/dL — ABNORMAL HIGH (ref 70–99)
Glucose-Capillary: 124 mg/dL — ABNORMAL HIGH (ref 70–99)
Glucose-Capillary: 124 mg/dL — ABNORMAL HIGH (ref 70–99)
Glucose-Capillary: 127 mg/dL — ABNORMAL HIGH (ref 70–99)
Glucose-Capillary: 130 mg/dL — ABNORMAL HIGH (ref 70–99)
Glucose-Capillary: 131 mg/dL — ABNORMAL HIGH (ref 70–99)
Glucose-Capillary: 132 mg/dL — ABNORMAL HIGH (ref 70–99)
Glucose-Capillary: 143 mg/dL — ABNORMAL HIGH (ref 70–99)
Glucose-Capillary: 144 mg/dL — ABNORMAL HIGH (ref 70–99)
Glucose-Capillary: 145 mg/dL — ABNORMAL HIGH (ref 70–99)
Glucose-Capillary: 152 mg/dL — ABNORMAL HIGH (ref 70–99)
Glucose-Capillary: 86 mg/dL (ref 70–99)
Glucose-Capillary: 97 mg/dL (ref 70–99)

## 2011-01-11 LAB — URINALYSIS, ROUTINE W REFLEX MICROSCOPIC
Bilirubin Urine: NEGATIVE
Glucose, UA: NEGATIVE mg/dL
Glucose, UA: NEGATIVE mg/dL
Ketones, ur: NEGATIVE mg/dL
Nitrite: NEGATIVE
Protein, ur: NEGATIVE mg/dL
Specific Gravity, Urine: 1.015 (ref 1.005–1.030)
pH: 5 (ref 5.0–8.0)

## 2011-01-11 LAB — DIFFERENTIAL
Basophils Absolute: 0.1 10*3/uL (ref 0.0–0.1)
Basophils Relative: 0 % (ref 0–1)
Eosinophils Relative: 1 % (ref 0–5)
Lymphocytes Relative: 18 % (ref 12–46)
Lymphs Abs: 0.8 10*3/uL (ref 0.7–4.0)
Monocytes Absolute: 1.2 10*3/uL — ABNORMAL HIGH (ref 0.1–1.0)
Monocytes Absolute: 0.8 10*3/uL (ref 0.1–1.0)
Monocytes Relative: 6 % (ref 3–12)
Neutro Abs: 18.4 10*3/uL — ABNORMAL HIGH (ref 1.7–7.7)
Neutro Abs: 6.4 10*3/uL (ref 1.7–7.7)

## 2011-01-11 LAB — CBC
HCT: 24.6 % — ABNORMAL LOW (ref 39.0–52.0)
HCT: 25 % — ABNORMAL LOW (ref 39.0–52.0)
HCT: 26.8 % — ABNORMAL LOW (ref 39.0–52.0)
HCT: 27.5 % — ABNORMAL LOW (ref 39.0–52.0)
HCT: 31.7 % — ABNORMAL LOW (ref 39.0–52.0)
HCT: 31.6 % — ABNORMAL LOW (ref 39.0–52.0)
HCT: 32.4 % — ABNORMAL LOW (ref 39.0–52.0)
HCT: 36.5 % — ABNORMAL LOW (ref 39.0–52.0)
Hemoglobin: 10.2 g/dL — ABNORMAL LOW (ref 13.0–17.0)
Hemoglobin: 10.5 g/dL — ABNORMAL LOW (ref 13.0–17.0)
Hemoglobin: 8.1 g/dL — ABNORMAL LOW (ref 13.0–17.0)
Hemoglobin: 8.7 g/dL — ABNORMAL LOW (ref 13.0–17.0)
Hemoglobin: 8.8 g/dL — ABNORMAL LOW (ref 13.0–17.0)
Hemoglobin: 9.5 g/dL — ABNORMAL LOW (ref 13.0–17.0)
Hemoglobin: 9.7 g/dL — ABNORMAL LOW (ref 13.0–17.0)
Hemoglobin: 10.3 g/dL — ABNORMAL LOW (ref 13.0–17.0)
Hemoglobin: 12 g/dL — ABNORMAL LOW (ref 13.0–17.0)
Hemoglobin: 12.5 g/dL — ABNORMAL LOW (ref 13.0–17.0)
MCH: 27.4 pg (ref 26.0–34.0)
MCH: 27.8 pg (ref 26.0–34.0)
MCH: 28.5 pg (ref 26.0–34.0)
MCH: 28.8 pg (ref 26.0–34.0)
MCH: 28.1 pg (ref 26.0–34.0)
MCH: 28.3 pg (ref 26.0–34.0)
MCH: 28.6 pg (ref 26.0–34.0)
MCH: 28.9 pg (ref 26.0–34.0)
MCHC: 32 g/dL (ref 30.0–36.0)
MCHC: 32.3 g/dL (ref 30.0–36.0)
MCHC: 32.8 g/dL (ref 30.0–36.0)
MCHC: 32.8 g/dL (ref 30.0–36.0)
MCHC: 33.1 g/dL (ref 30.0–36.0)
MCHC: 33.1 g/dL (ref 30.0–36.0)
MCHC: 31.8 g/dL (ref 30.0–36.0)
MCHC: 33.2 g/dL (ref 30.0–36.0)
MCV: 85.4 fL (ref 78.0–100.0)
MCV: 87.7 fL (ref 78.0–100.0)
MCV: 86.8 fL (ref 78.0–100.0)
MCV: 86.8 fL (ref 78.0–100.0)
MCV: 88.4 fL (ref 78.0–100.0)
MCV: 89.2 fL (ref 78.0–100.0)
MCV: 90 fL (ref 78.0–100.0)
Platelets: 141 10*3/uL — ABNORMAL LOW (ref 150–400)
Platelets: 158 10*3/uL (ref 150–400)
Platelets: 250 10*3/uL (ref 150–400)
Platelets: 437 10*3/uL — ABNORMAL HIGH (ref 150–400)
Platelets: 123 10*3/uL — ABNORMAL LOW (ref 150–400)
RBC: 2.85 MIL/uL — ABNORMAL LOW (ref 4.22–5.81)
RBC: 3.05 MIL/uL — ABNORMAL LOW (ref 4.22–5.81)
RBC: 3.14 MIL/uL — ABNORMAL LOW (ref 4.22–5.81)
RBC: 3.54 MIL/uL — ABNORMAL LOW (ref 4.22–5.81)
RBC: 3.98 MIL/uL — ABNORMAL LOW (ref 4.22–5.81)
RBC: 4.33 MIL/uL (ref 4.22–5.81)
RDW: 15.4 % (ref 11.5–15.5)
RDW: 15.6 % — ABNORMAL HIGH (ref 11.5–15.5)
RDW: 15.6 % — ABNORMAL HIGH (ref 11.5–15.5)
RDW: 15.6 % — ABNORMAL HIGH (ref 11.5–15.5)
RDW: 16.4 % — ABNORMAL HIGH (ref 11.5–15.5)
RDW: 14.7 % (ref 11.5–15.5)
RDW: 14.9 % (ref 11.5–15.5)
WBC: 10.2 10*3/uL (ref 4.0–10.5)
WBC: 10.8 10*3/uL — ABNORMAL HIGH (ref 4.0–10.5)
WBC: 13.9 10*3/uL — ABNORMAL HIGH (ref 4.0–10.5)
WBC: 14.1 10*3/uL — ABNORMAL HIGH (ref 4.0–10.5)
WBC: 14.7 10*3/uL — ABNORMAL HIGH (ref 4.0–10.5)
WBC: 11.9 10*3/uL — ABNORMAL HIGH (ref 4.0–10.5)
WBC: 15.3 10*3/uL — ABNORMAL HIGH (ref 4.0–10.5)

## 2011-01-11 LAB — BLOOD GAS, ARTERIAL
Bicarbonate: 24.4 mEq/L — ABNORMAL HIGH (ref 20.0–24.0)
pCO2 arterial: 40.5 mmHg (ref 35.0–45.0)
pH, Arterial: 7.397 (ref 7.350–7.450)
pO2, Arterial: 73.5 mmHg — ABNORMAL LOW (ref 80.0–100.0)

## 2011-01-11 LAB — COMPREHENSIVE METABOLIC PANEL
ALT: 35 U/L (ref 0–53)
ALT: 49 U/L (ref 0–53)
AST: 44 U/L — ABNORMAL HIGH (ref 0–37)
Albumin: 2 g/dL — ABNORMAL LOW (ref 3.5–5.2)
Albumin: 2.4 g/dL — ABNORMAL LOW (ref 3.5–5.2)
BUN: 35 mg/dL — ABNORMAL HIGH (ref 6–23)
BUN: 14 mg/dL (ref 6–23)
CO2: 24 mEq/L (ref 19–32)
CO2: 24 mEq/L (ref 19–32)
Calcium: 7.5 mg/dL — ABNORMAL LOW (ref 8.4–10.5)
Calcium: 8.6 mg/dL (ref 8.4–10.5)
Chloride: 100 mEq/L (ref 96–112)
Creatinine, Ser: 2.07 mg/dL — ABNORMAL HIGH (ref 0.4–1.5)
Creatinine, Ser: 0.99 mg/dL (ref 0.4–1.5)
GFR calc Af Amer: 37 mL/min — ABNORMAL LOW (ref >60)
GFR calc non Af Amer: 31 mL/min — ABNORMAL LOW (ref >60)
GFR calc non Af Amer: 34 mL/min — ABNORMAL LOW (ref >60)
Glucose, Bld: 108 mg/dL — ABNORMAL HIGH (ref 70–99)
Sodium: 138 mEq/L (ref 135–145)
Sodium: 139 mEq/L (ref 135–145)
Total Bilirubin: 0.7 mg/dL (ref 0.3–1.2)
Total Protein: 5.8 g/dL — ABNORMAL LOW (ref 6.0–8.3)

## 2011-01-11 LAB — PROTIME-INR
INR: 1.24 (ref 0.00–1.49)
INR: 1.33 (ref 0.00–1.49)
Prothrombin Time: 15.8 seconds — ABNORMAL HIGH (ref 11.6–15.2)
Prothrombin Time: 16.7 seconds — ABNORMAL HIGH (ref 11.6–15.2)

## 2011-01-11 LAB — BASIC METABOLIC PANEL
BUN: 12 mg/dL (ref 6–23)
BUN: 12 mg/dL (ref 6–23)
BUN: 13 mg/dL (ref 6–23)
BUN: 15 mg/dL (ref 6–23)
BUN: 22 mg/dL (ref 6–23)
BUN: 23 mg/dL (ref 6–23)
BUN: 25 mg/dL — ABNORMAL HIGH (ref 6–23)
BUN: 26 mg/dL — ABNORMAL HIGH (ref 6–23)
BUN: 28 mg/dL — ABNORMAL HIGH (ref 6–23)
CO2: 24 mEq/L (ref 19–32)
CO2: 28 mEq/L (ref 19–32)
CO2: 30 mEq/L (ref 19–32)
CO2: 32 mEq/L (ref 19–32)
CO2: 32 mEq/L (ref 19–32)
Calcium: 7.8 mg/dL — ABNORMAL LOW (ref 8.4–10.5)
Calcium: 7.9 mg/dL — ABNORMAL LOW (ref 8.4–10.5)
Calcium: 8 mg/dL — ABNORMAL LOW (ref 8.4–10.5)
Calcium: 8.4 mg/dL (ref 8.4–10.5)
Calcium: 8.6 mg/dL (ref 8.4–10.5)
Calcium: 8.6 mg/dL (ref 8.4–10.5)
Calcium: 8.9 mg/dL (ref 8.4–10.5)
Chloride: 110 mEq/L (ref 96–112)
Chloride: 102 mEq/L (ref 96–112)
Chloride: 103 mEq/L (ref 96–112)
Chloride: 107 mEq/L (ref 96–112)
Chloride: 114 mEq/L — ABNORMAL HIGH (ref 96–112)
Creatinine, Ser: 1 mg/dL (ref 0.4–1.5)
Creatinine, Ser: 1.01 mg/dL (ref 0.4–1.5)
Creatinine, Ser: 1 mg/dL (ref 0.4–1.5)
Creatinine, Ser: 1.11 mg/dL (ref 0.4–1.5)
Creatinine, Ser: 1.12 mg/dL (ref 0.4–1.5)
Creatinine, Ser: 1.13 mg/dL (ref 0.4–1.5)
GFR calc Af Amer: >60 mL/min (ref >60)
GFR calc Af Amer: >60 mL/min (ref >60)
GFR calc Af Amer: >60 mL/min (ref >60)
GFR calc Af Amer: >60 mL/min (ref >60)
GFR calc Af Amer: 42 mL/min — ABNORMAL LOW (ref >60)
GFR calc Af Amer: >60 mL/min (ref >60)
GFR calc non Af Amer: >60 mL/min (ref >60)
GFR calc non Af Amer: >60 mL/min (ref >60)
GFR calc non Af Amer: >60 mL/min (ref >60)
GFR calc non Af Amer: >60 mL/min (ref >60)
GFR calc non Af Amer: >60 mL/min (ref >60)
GFR calc non Af Amer: 35 mL/min — ABNORMAL LOW (ref >60)
GFR calc non Af Amer: >60 mL/min (ref >60)
GFR calc non Af Amer: >60 mL/min (ref >60)
Glucose, Bld: 150 mg/dL — ABNORMAL HIGH (ref 70–99)
Glucose, Bld: 85 mg/dL (ref 70–99)
Glucose, Bld: 86 mg/dL (ref 70–99)
Glucose, Bld: 124 mg/dL — ABNORMAL HIGH (ref 70–99)
Glucose, Bld: 128 mg/dL — ABNORMAL HIGH (ref 70–99)
Glucose, Bld: 138 mg/dL — ABNORMAL HIGH (ref 70–99)
Glucose, Bld: 153 mg/dL — ABNORMAL HIGH (ref 70–99)
Potassium: 3.2 mEq/L — ABNORMAL LOW (ref 3.5–5.1)
Potassium: 3.4 mEq/L — ABNORMAL LOW (ref 3.5–5.1)
Potassium: 3.4 mEq/L — ABNORMAL LOW (ref 3.5–5.1)
Potassium: 4 mEq/L (ref 3.5–5.1)
Potassium: 4.7 mEq/L (ref 3.5–5.1)
Sodium: 139 mEq/L (ref 135–145)
Sodium: 141 mEq/L (ref 135–145)
Sodium: 142 mEq/L (ref 135–145)
Sodium: 137 mEq/L (ref 135–145)
Sodium: 141 mEq/L (ref 135–145)

## 2011-01-11 LAB — URINE MICROSCOPIC-ADD ON

## 2011-01-11 LAB — POCT I-STAT 3, ART BLOOD GAS (G3+)
O2 Saturation: 98 %
Patient temperature: 98.7
TCO2: 25 mmol/L (ref 0–100)
pCO2 arterial: 32.5 mmHg — ABNORMAL LOW (ref 35.0–45.0)
pCO2 arterial: 38 mmHg (ref 35.0–45.0)
pH, Arterial: 7.405 (ref 7.350–7.450)

## 2011-01-11 LAB — URINALYSIS, MICROSCOPIC ONLY
Glucose, UA: NEGATIVE mg/dL
Ketones, ur: NEGATIVE mg/dL
Protein, ur: 30 mg/dL — AB

## 2011-01-11 LAB — URINE CULTURE
Colony Count: >=100000
Culture  Setup Time: 201109120957
Special Requests: NEGATIVE

## 2011-01-11 LAB — CULTURE, BLOOD (ROUTINE X 2)

## 2011-01-11 LAB — TYPE AND SCREEN: ABO/RH(D): O POS

## 2011-01-11 LAB — MRSA PCR SCREENING
MRSA by PCR: NEGATIVE
MRSA by PCR: NEGATIVE

## 2011-01-11 LAB — AMYLASE: Amylase: 91 U/L (ref 0–105)

## 2011-01-11 LAB — PHOSPHORUS
Phosphorus: 1.7 mg/dL — ABNORMAL LOW (ref 2.3–4.6)
Phosphorus: 2.7 mg/dL (ref 2.3–4.6)

## 2011-01-11 LAB — CARBOXYHEMOGLOBIN: Total hemoglobin: 9.4 g/dL — ABNORMAL LOW (ref 13.5–18.0)

## 2011-01-11 LAB — PREALBUMIN
Prealbumin: 7.4 mg/dL — ABNORMAL LOW (ref 18.0–45.0)
Prealbumin: 7.8 mg/dL — ABNORMAL LOW (ref 18.0–45.0)

## 2011-01-11 LAB — CULTURE, BAL-QUANTITATIVE W GRAM STAIN: Colony Count: 50000

## 2011-01-11 LAB — MAGNESIUM: Magnesium: 2.3 mg/dL (ref 1.5–2.5)

## 2011-01-11 LAB — CARDIAC PANEL(CRET KIN+CKTOT+MB+TROPI)
CK, MB: 2.5 ng/mL (ref 0.3–4.0)
Relative Index: 2.3 (ref 0.0–2.5)
Total CK: 111 U/L (ref 7–232)

## 2011-01-11 LAB — PROCALCITONIN: Procalcitonin: 0.23 ng/mL

## 2011-01-11 LAB — BRAIN NATRIURETIC PEPTIDE: Pro B Natriuretic peptide (BNP): 171 pg/mL — ABNORMAL HIGH (ref 0.0–100.0)

## 2011-01-11 LAB — LACTIC ACID, PLASMA: Lactic Acid, Venous: 1.9 mmol/L (ref 0.5–2.2)

## 2011-01-11 LAB — APTT: aPTT: 37 seconds (ref 24–37)

## 2011-01-12 LAB — CBC
HCT: 33.7 % — ABNORMAL LOW (ref 39.0–52.0)
HCT: 37.9 % — ABNORMAL LOW (ref 39.0–52.0)
Hemoglobin: 11.1 g/dL — ABNORMAL LOW (ref 13.0–17.0)
Hemoglobin: 12.7 g/dL — ABNORMAL LOW (ref 13.0–17.0)
Hemoglobin: 12.9 g/dL — ABNORMAL LOW (ref 13.0–17.0)
MCH: 28.4 pg (ref 26.0–34.0)
MCHC: 32.9 g/dL (ref 30.0–36.0)
MCV: 87.1 fL (ref 78.0–100.0)
Platelets: 160 10*3/uL (ref 150–400)
RBC: 4.48 MIL/uL (ref 4.22–5.81)
RDW: 14.4 % (ref 11.5–15.5)
RDW: 14.8 % (ref 11.5–15.5)
WBC: 15.7 10*3/uL — ABNORMAL HIGH (ref 4.0–10.5)

## 2011-01-12 LAB — BASIC METABOLIC PANEL
BUN: 17 mg/dL (ref 6–23)
Calcium: 8.1 mg/dL — ABNORMAL LOW (ref 8.4–10.5)
GFR calc non Af Amer: 59 mL/min — ABNORMAL LOW (ref >60)
Glucose, Bld: 140 mg/dL — ABNORMAL HIGH (ref 70–99)
Potassium: 4.2 mEq/L (ref 3.5–5.1)
Sodium: 137 mEq/L (ref 135–145)

## 2011-01-12 LAB — CARDIAC PANEL(CRET KIN+CKTOT+MB+TROPI)
CK, MB: 16.4 ng/mL (ref 0.3–4.0)
CK, MB: 27.7 ng/mL (ref 0.3–4.0)
Relative Index: 1.8 (ref 0.0–2.5)
Relative Index: 2.6 — ABNORMAL HIGH (ref 0.0–2.5)
Total CK: 1061 U/L — ABNORMAL HIGH (ref 7–232)
Troponin I: 0.03 ng/mL (ref 0.00–0.06)

## 2011-01-12 LAB — URINALYSIS, ROUTINE W REFLEX MICROSCOPIC
Bilirubin Urine: NEGATIVE
Glucose, UA: NEGATIVE mg/dL
Ketones, ur: NEGATIVE mg/dL
Leukocytes, UA: NEGATIVE
Nitrite: NEGATIVE
Protein, ur: NEGATIVE mg/dL

## 2011-01-12 LAB — COMPREHENSIVE METABOLIC PANEL
ALT: 18 U/L (ref 0–53)
ALT: 25 U/L (ref 0–53)
AST: 35 U/L (ref 0–37)
AST: 62 U/L — ABNORMAL HIGH (ref 0–37)
Albumin: 3.4 g/dL — ABNORMAL LOW (ref 3.5–5.2)
Albumin: 3.5 g/dL (ref 3.5–5.2)
Alkaline Phosphatase: 95 U/L (ref 39–117)
BUN: 16 mg/dL (ref 6–23)
CO2: 28 mEq/L (ref 19–32)
Calcium: 8.7 mg/dL (ref 8.4–10.5)
Chloride: 104 mEq/L (ref 96–112)
Chloride: 105 mEq/L (ref 96–112)
GFR calc Af Amer: >60 mL/min (ref >60)
GFR calc non Af Amer: 54 mL/min — ABNORMAL LOW (ref >60)
Potassium: 4.1 mEq/L (ref 3.5–5.1)
Sodium: 137 mEq/L (ref 135–145)
Sodium: 138 mEq/L (ref 135–145)
Total Bilirubin: 0.8 mg/dL (ref 0.3–1.2)
Total Protein: 6.4 g/dL (ref 6.0–8.3)

## 2011-01-12 LAB — CK TOTAL AND CKMB (NOT AT ARMC)
CK, MB: 34.9 ng/mL (ref 0.3–4.0)
Relative Index: 4 — ABNORMAL HIGH (ref 0.0–2.5)
Total CK: 814 U/L — ABNORMAL HIGH (ref 7–232)

## 2011-01-12 LAB — CULTURE, BLOOD (ROUTINE X 2)

## 2011-01-12 LAB — TSH: TSH: 0.88 u[IU]/mL (ref 0.350–4.500)

## 2011-01-12 LAB — DIFFERENTIAL
Basophils Absolute: 0 10*3/uL (ref 0.0–0.1)
Basophils Relative: 0 % (ref 0–1)
Eosinophils Absolute: 0 10*3/uL (ref 0.0–0.7)
Eosinophils Relative: 0 % (ref 0–5)
Monocytes Absolute: 0.5 10*3/uL (ref 0.1–1.0)
Monocytes Relative: 3 % (ref 3–12)
Neutro Abs: 14 10*3/uL — ABNORMAL HIGH (ref 1.7–7.7)

## 2011-01-12 LAB — POCT I-STAT, CHEM 8
BUN: 21 mg/dL (ref 6–23)
Calcium, Ion: 1.09 mmol/L — ABNORMAL LOW (ref 1.12–1.32)
Creatinine, Ser: 1.2 mg/dL (ref 0.4–1.5)
TCO2: 28 mmol/L (ref 0–100)

## 2011-01-12 LAB — PROTIME-INR: INR: 0.98 (ref 0.00–1.49)

## 2011-01-12 LAB — APTT: aPTT: 22 seconds — ABNORMAL LOW (ref 24–37)

## 2011-01-12 LAB — TROPONIN I: Troponin I: 0.01 ng/mL (ref 0.00–0.06)

## 2011-01-17 LAB — COMPREHENSIVE METABOLIC PANEL
ALT: 16 U/L (ref 0–53)
AST: 24 U/L (ref 0–37)
Albumin: 3.4 g/dL — ABNORMAL LOW (ref 3.5–5.2)
Alkaline Phosphatase: 80 U/L (ref 39–117)
BUN: 20 mg/dL (ref 6–23)
Calcium: 9.1 mg/dL (ref 8.4–10.5)
Creatinine, Ser: 1.46 mg/dL (ref 0.4–1.5)
Creatinine, Ser: 1.52 mg/dL — ABNORMAL HIGH (ref 0.4–1.5)
GFR calc Af Amer: 53 mL/min — ABNORMAL LOW (ref >60)
Glucose, Bld: 112 mg/dL — ABNORMAL HIGH (ref 70–99)
Potassium: 3.6 mEq/L (ref 3.5–5.1)
Sodium: 134 mEq/L — ABNORMAL LOW (ref 135–145)
Total Protein: 6.1 g/dL (ref 6.0–8.3)
Total Protein: 6.1 g/dL (ref 6.0–8.3)

## 2011-01-17 LAB — BASIC METABOLIC PANEL
BUN: 17 mg/dL (ref 6–23)
CO2: 30 mEq/L (ref 19–32)
Calcium: 8.9 mg/dL (ref 8.4–10.5)
Chloride: 104 mEq/L (ref 96–112)
GFR calc Af Amer: >60 mL/min (ref >60)
GFR calc non Af Amer: 54 mL/min — ABNORMAL LOW (ref >60)
Glucose, Bld: 96 mg/dL (ref 70–99)
Sodium: 138 mEq/L (ref 135–145)
Sodium: 141 mEq/L (ref 135–145)

## 2011-01-17 LAB — CBC
HCT: 36.8 % — ABNORMAL LOW (ref 39.0–52.0)
Hemoglobin: 12.7 g/dL — ABNORMAL LOW (ref 13.0–17.0)
Hemoglobin: 12.8 g/dL — ABNORMAL LOW (ref 13.0–17.0)
MCHC: 34.5 g/dL (ref 30.0–36.0)
MCHC: 34.6 g/dL (ref 30.0–36.0)
MCHC: 34.9 g/dL (ref 30.0–36.0)
MCV: 88.3 fL (ref 78.0–100.0)
MCV: 89.3 fL (ref 78.0–100.0)
MCV: 89.7 fL (ref 78.0–100.0)
Platelets: 118 10*3/uL — ABNORMAL LOW (ref 150–400)
Platelets: DECREASED 10*3/uL (ref 150–400)
RBC: 4.1 MIL/uL — ABNORMAL LOW (ref 4.22–5.81)
RBC: 4.31 MIL/uL (ref 4.22–5.81)
RDW: 13.8 % (ref 11.5–15.5)

## 2011-01-17 LAB — T4, FREE: Free T4: 1.32 ng/dL (ref 0.80–1.80)

## 2011-01-17 LAB — GLUCOSE, CAPILLARY
Glucose-Capillary: 112 mg/dL — ABNORMAL HIGH (ref 70–99)
Glucose-Capillary: 113 mg/dL — ABNORMAL HIGH (ref 70–99)
Glucose-Capillary: 117 mg/dL — ABNORMAL HIGH (ref 70–99)
Glucose-Capillary: 129 mg/dL — ABNORMAL HIGH (ref 70–99)
Glucose-Capillary: 99 mg/dL (ref 70–99)

## 2011-01-17 LAB — PROTIME-INR
INR: 1.07 (ref 0.00–1.49)
Prothrombin Time: 13.8 seconds (ref 11.6–15.2)

## 2011-01-17 LAB — APTT: aPTT: 31 seconds (ref 24–37)

## 2011-01-21 LAB — GLUCOSE, CAPILLARY

## 2011-02-01 ENCOUNTER — Emergency Department (HOSPITAL_COMMUNITY)
Admission: EM | Admit: 2011-02-01 | Discharge: 2011-02-01 | Disposition: A | Discharge status: Home / Self Care | Payer: Medicare Other | Attending: Emergency Medicine | Admitting: Emergency Medicine

## 2011-02-01 ENCOUNTER — Emergency Department (HOSPITAL_COMMUNITY): Payer: Medicare Other

## 2011-02-01 DIAGNOSIS — Z79899 Other long term (current) drug therapy: Secondary | ICD-10-CM | POA: Insufficient documentation

## 2011-02-01 DIAGNOSIS — J4489 Other specified chronic obstructive pulmonary disease: Secondary | ICD-10-CM | POA: Insufficient documentation

## 2011-02-01 DIAGNOSIS — R4182 Altered mental status, unspecified: Secondary | ICD-10-CM | POA: Insufficient documentation

## 2011-02-01 DIAGNOSIS — J449 Chronic obstructive pulmonary disease, unspecified: Secondary | ICD-10-CM | POA: Insufficient documentation

## 2011-02-01 DIAGNOSIS — F29 Unspecified psychosis not due to a substance or known physiological condition: Secondary | ICD-10-CM | POA: Insufficient documentation

## 2011-02-01 DIAGNOSIS — N289 Disorder of kidney and ureter, unspecified: Secondary | ICD-10-CM | POA: Insufficient documentation

## 2011-02-01 DIAGNOSIS — M549 Dorsalgia, unspecified: Secondary | ICD-10-CM | POA: Insufficient documentation

## 2011-02-01 DIAGNOSIS — G8929 Other chronic pain: Secondary | ICD-10-CM | POA: Insufficient documentation

## 2011-02-01 DIAGNOSIS — I10 Essential (primary) hypertension: Secondary | ICD-10-CM | POA: Insufficient documentation

## 2011-02-01 LAB — POCT I-STAT, CHEM 8
BUN: 34 mg/dL — ABNORMAL HIGH (ref 6–23)
Calcium, Ion: 1.17 mmol/L (ref 1.12–1.32)
Chloride: 102 mEq/L (ref 96–112)

## 2011-02-01 LAB — URINALYSIS, ROUTINE W REFLEX MICROSCOPIC
Bilirubin Urine: NEGATIVE
Glucose, UA: NEGATIVE mg/dL
Hgb urine dipstick: NEGATIVE
Ketones, ur: NEGATIVE mg/dL
pH: 5.5 (ref 5.0–8.0)

## 2011-02-01 LAB — CBC
HCT: 35.5 % — ABNORMAL LOW (ref 39.0–52.0)
Hemoglobin: 11.7 g/dL — ABNORMAL LOW (ref 13.0–17.0)
MCH: 28.1 pg (ref 26.0–34.0)
MCV: 85.3 fL (ref 78.0–100.0)
RBC: 4.16 MIL/uL — ABNORMAL LOW (ref 4.22–5.81)

## 2011-02-01 LAB — DIFFERENTIAL
Eosinophils Absolute: 0.5 10*3/uL (ref 0.0–0.7)
Lymphocytes Relative: 30 % (ref 12–46)
Lymphs Abs: 2.5 10*3/uL (ref 0.7–4.0)
Monocytes Relative: 6 % (ref 3–12)
Neutrophils Relative %: 58 % (ref 43–77)

## 2011-02-04 LAB — D-DIMER, QUANTITATIVE: D-Dimer, Quant: 1.36 ug/mL-FEU — ABNORMAL HIGH (ref 0.00–0.48)

## 2011-02-04 LAB — CARDIAC PANEL(CRET KIN+CKTOT+MB+TROPI)
CK, MB: 2.6 ng/mL (ref 0.3–4.0)
Total CK: 184 U/L (ref 7–232)

## 2011-02-04 LAB — URINALYSIS, ROUTINE W REFLEX MICROSCOPIC
Glucose, UA: 100 mg/dL — AB
Ketones, ur: NEGATIVE mg/dL
Nitrite: NEGATIVE
Protein, ur: NEGATIVE mg/dL

## 2011-02-04 LAB — CBC
MCHC: 33 g/dL (ref 30.0–36.0)
MCV: 88.5 fL (ref 78.0–100.0)
RDW: 15 % (ref 11.5–15.5)

## 2011-02-04 LAB — COMPREHENSIVE METABOLIC PANEL
ALT: 14 U/L (ref 0–53)
AST: 26 U/L (ref 0–37)
Calcium: 9.1 mg/dL (ref 8.4–10.5)
Creatinine, Ser: 1.25 mg/dL (ref 0.4–1.5)
GFR calc Af Amer: >60 mL/min (ref >60)
Glucose, Bld: 91 mg/dL (ref 70–99)
Sodium: 139 mEq/L (ref 135–145)
Total Protein: 6.6 g/dL (ref 6.0–8.3)

## 2011-02-04 LAB — VITAMIN B12: Vitamin B-12: 311 pg/mL (ref 211–911)

## 2011-02-04 LAB — PROTIME-INR: INR: 1.1 (ref 0.00–1.49)

## 2011-02-04 LAB — TSH: TSH: 0.819 u[IU]/mL (ref 0.350–4.500)

## 2011-03-13 NOTE — Assessment & Plan Note (Signed)
OFFICE VISIT   Logan Allen, Logan Allen  DOB:  Feb 02, 1924                                       08/15/2009  ZHYQM#:[id number]   REASON FOR VISIT:  Is follow-up aneurysm.   HISTORY:  This is an 77 year old gentleman who is status post  endovascular repair of an abdominal aortic aneurysm.  This was done on  September 21, 2008 using a [employer] device.  Patient has done well  postoperatively and comes back in today with a CT scan.  Has not had any  abdominal pain.  His claudication is stable since his last visit.   He continues to be medically managed for hypertension, coronary artery  disease.   He continues to be nonsmoker.   PHYSICAL EXAMINATION:  Blood pressure 154/77, pulse 85, respirations 20,  he is well-appearing in no distress.  Normocephalic, atraumatic.  Cardiovascular:  Regular rate and rhythm, respirations nonlabored.  Abdomen:  Soft, nontender.  No pulsatile mass.  Extremities:  Warm, well-  perfused.  Psych:  He is alert and oriented x3.   DIAGNOSTICS:  CT angiogram done on October 15 was independently reviewed  by myself.  This shows a stent graft to be in good position.  His  aneurysm has decreased in size.   ASSESSMENT/PLAN:  Status post endovascular aneurysm repair.   PLAN:  The patient will continue on our stent graft protocol, plan on  seeing him back in a year.   Jorge Ny, MD  Electronically Signed   VWB/MEDQ  D:  08/15/2009  T:  08/16/2009  Job:  [id number]   cc:   Dr. Garnette Scheuermann  Corky Crafts, MD

## 2011-03-13 NOTE — Op Note (Signed)
NAME:  Logan Allen, ACHOR NO.:  [id number]   MEDICAL RECORD NO.:  [id number]          PATIENT TYPE:  INP   LOCATION:  3313                         FACILITY:  [facility]   PHYSICIAN:  Di Kindle. Edilia Bo, M.D.DATE OF BIRTH:  [birth year]   DATE OF PROCEDURE:  09/29/2008  DATE OF DISCHARGE:                               OPERATIVE REPORT   PREOPERATIVE DIAGNOSIS:  Abdominal aortic aneurysm.   POSTOPERATIVE DIAGNOSIS:  Abdominal aortic aneurysm.   PROCEDURE:  Exposure of right common femoral artery and primary closure  of right common femoral artery and assistance with Dr. Myra Gianotti who  performed endovascular stent graft repair which is dictated separately.   TECHNIQUE:  The patient was taken to the operating room and received a  general anesthetic.  The abdomen and groins were prepped and draped in  usual sterile fashion.  In the right groin, transverse incision was made  above the inguinal crease and through this incision, dissection was  carried down to the common femoral artery which was dissected free and  controlled with a blue vessel loop.  It was a significantly diseased  artery with significant calcific plaque throughout.  I had to dissect up  fairly high on the common femoral artery under the inguinal ligament to  find a soft spot where we could cannulate the artery.  The artery was  controlled proximally and distally.  There was a large lateral branch  which was controlled laterally.  The device was introduced  percutaneously on the right and at the end of the completion of the  procedure, the arteriotomy was closed with interrupted 5-0 Prolene  sutures.  At the completion, there was good Doppler flow in the artery.  Hemostasis was obtained in the wound.  The wound was closed with deep  layer of 2-0 Vicryl.  Subcutaneous layer with 2-0 Vicryl.  The skin  closed with subcutaneous layer of 3-0 Vicryl and the skin closed with a  4-0 subcuticular stitch.  A sterile  dressing was applied.  The patient  tolerated the procedure well was transferred to the recovery room in  satisfactory condition.  All needle and sponge counts were correct.      Di Kindle. Edilia Bo, M.D.  Electronically Signed     CSD/MEDQ  D:  09/29/2008  T:  09/29/2008  Job:  [id number]

## 2011-03-13 NOTE — Op Note (Signed)
NAME:  Logan Allen, Logan Allen                ACCOUNT NO.:  [id number]   MEDICAL RECORD NO.:  [id number]          PATIENT TYPE:  INP   LOCATION:  3313                         FACILITY:  [facility]   PHYSICIAN:  Juleen China IV, MDDATE OF BIRTH:  Oct 09, 1924   DATE OF PROCEDURE:  09/29/2008  DATE OF DISCHARGE:                               OPERATIVE REPORT   PREOPERATIVE DIAGNOSES:  1. Abdominal aortic aneurysm.  2. Left common iliac aneurysm.   POSTOPERATIVE DIAGNOSES:  1. Abdominal aortic aneurysm.  2. Left common iliac aneurysm.   PROCEDURES PERFORMED:  1. Endovascular repair of abdominal aortic aneurysm and common iliac      aneurysm on the left.  2. Bilateral common femoral artery exposure.  3. Catheter in aorta x2.  4. Abdominal aortogram.  5. Distal extension x1.   SURGEON:  1. Charlena Cross, MD   ASSISTANT:  Di Kindle. Edilia Bo, MD   ANESTHESIA:  General.   BLOOD LOSS:  500 mL.   COMPLICATIONS:  None.   FINDINGS:  Complete exclusion.   DEVICES USED:  1. Main body is a Gore Excluder 23 x 12 x 18, primary access was on      the left.  2. Contralateral leg is a Gore Excluder 16 x 13.5.  3. Distal left extension is a Biomedical scientist 10 x 7.   INDICATION:  This is an 77 year old gentleman who was being evaluated  for peripheral vascular disease and underwent a CT angiogram, which  identified a 5.3 cm infrarenal abdominal aortic aneurysm and a left  common iliac aneurysm measuring 3.9 cm.  He has been evaluated for  endovascular repair and found to be a good candidate.  He has an  occluded left hypogastric artery.  Risks and benefits were discussed  with the patient as detailed in the clinic note.  Informed consent was  signed.   PROCEDURE:  The patient was identified in the holding area and taken to  room 9.  He was placed supine on the table.  General endotracheal  anesthesia was administered.  The patient was prepped and draped in  standard sterile fashion.  A  time-out was called and antibiotics were  given.  Please see Dr. Adele Dan note for exposure of the right groin.  On the left, the inguinal ligament was identified by bony landmarks.  An  oblique incision was made below the inguinal ligament.  Cautery was used  to dissect the subcutaneous tissue.  The patient had a very deep groin.  Ultimately, the femoral artery was identified.  It was mobilized up to  the inguinal ligament and down to the profunda at superficial femoral  bifurcation.  The artery was found to be heavily calcified throughout  the extent of the exposed portion.  There was an area that was soft in  the lateral aspect of the artery, which was selected for access.  Once  we were set off for access, the patient was given systemic  heparinization.  18-gauge needles were used to access femoral artery  bilaterally.  Kumpe catheter was used on both sides to gain wire  access  into the abdominal aorta.  8-French sheaths were placed bilaterally.  A  marked Omni flush catheter was placed at the level L1 the left leg and  abdominal angiogram was obtained for sizing purposes.  Once this was  done, it was removed over an Amplatz superstiff wire.  Over the Amplatz  superstiff wire, a 2-French sheath was placed up to the level of L1.  A  marker to mark Omni flush catheter was then advanced up to the left side  and placed to the level of L1.  The device was prepped on the back  table.  The main body was a 23 x 12 x 18 device.  Again, primary access  was in the left.  The device was advanced through the sheath.  The  contralateral gate was placed on the patient's left side.  Arteriogram  was again performed, being sure that the gate was to open within the  aneurysm as the patient had a long neck.  The device was purposely  deployed below the renal arteries to give it a long neck and the desire  to have the gate open within the aneurysm.  The device was then  successfully deployed.  Next, the  contralateral gate was cannulated  using a Benson wire and a Kumpe catheter.  We were able to rotate the  Omni flush catheter within the main body of the graft confirming  successful cannulation.  An Amplatz superstiff wire was then placed to  the contralateral limb.  The image intensifier was pulled down to the  pelvis and obliqued in a left anterior oblique position.  A pelvic  angiogram was obtained delineating the location of the right hypogastric  artery.  The contralateral limb selected was a Gore Excluder 16 x 13.5.  It was successfully deployed landing short of the right hypogastric  artery.  Then, we set to perform a distal extension on the ipsilateral  side.  This was a Biomedical scientist 10 x 7.  It was successfully deployed  completely excluding the left iliac aneurysm.  Once this was done, the  top portion of the graft was molded to confirmation with the [facility]  balloon.  The junctions were also molded with a Coda balloon.  I was  unsatisfied with an area of stenosis in the right common iliac artery.  Therefore, this was angioplastied using a 10 x 4 and 12 x 4 balloon  taken to bursting pressures.  Final arteriogram was performed.  This  revealed continued patency of the renal arteries.  There was no evidence  of endoleak.  The aneurysm was successfully excluded.  The right  hypogastric artery remains patent.  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  With these findings, I was satisfied with the results  and set up to close.  Catheters and wires were removed.  The sheaths  were removed.  We had extreme difficulty getting vascular control given  the extent of calcification within the patient's femoral artery.  Multiple clamps and balloon catheters were used to obtain hemostasis.  Ultimately, we were able to visualize the artery enough to perform  primary closure in the left groin.  This was done with a running 5-0  Prolene.  There was an area on the profunda femoral  artery that was  injured from the clamp placement.  This had to be sutured close  primarily with interrupted 5-0 Prolene.  Ultimately, we were able to get  hemostasis.  Doppler was  used to evaluate the femoral artery, and there  was a good signal proximal and distal to our closure.  Heparin was  reversed with 50 mg of protamine.  Once I was satisfied with hemostasis,  we set up to close.  The femoral sheath was closed with a running 2-0  Vicryl.  An additional layer of 2-0 Vicryl was used to close deep  tissue.  Two layers of 3-0 Vicryl were used to close the subcutaneous  tissue and 4-0 Vicryl was used to close the skin.  A counterincision  which was made in the groin on the left was closed with 3-0 Vicryl.  Dermabond was placed in the incisions.  The patient was then  successfully awakened from anesthesia.  He was taken to recovery room in  stable condition.          ______________________________  V. Charlena Cross, MD  Electronically Signed    VWB/MEDQ  D:  09/29/2008  T:  09/30/2008  Job:  [id number]

## 2011-03-13 NOTE — Assessment & Plan Note (Signed)
OFFICE VISIT   Logan Allen, Logan Allen  DOB:  12-Jan-1924                                       11/01/2008  ZOXWR#:[id number]   REASON FOR VISIT:  Follow up.   HISTORY:  This is an 78 year old gentleman whom I saw at the request of  Dr. Eldridge Dace and Dr. Katrinka Blazing for evaluation of an abdominal aortic  aneurysm.  The patient was by CT scan found to have a 5.3 cm infrarenal  abdominal aortic aneurysm, as well as a left common iliac aneurysm  measuring 3.9 cm.  On September 29, 2008, the patient underwent  endovascular repair of the abdominal aortic aneurysm using a Gore  excluder device.  Left limb went into the left external iliac artery.  The patient's postoperative course was prolonged secondary to urinary  retention and deconditioning.  Ultimately, he was discharged to home and  comes back in today for his first postoperative visit.  Since he has  been discharged, he has fallen two times and suffered rib fractures.  He  reports being unsteady on his feet.  He also complains of some chest  pain and shortness of breath.   PHYSICAL EXAMINATION:  VITAL SIGNS:  Blood pressure is 184/75, pulse is  108, respirations 18.  GENERAL:  He is in no distress at this time.  ABDOMEN:  Soft.  No pulsatile mass is felt.  Bilateral inguinal  incisions are well-healed.  There is no evidence of infection.   DIAGNOSTIC STUDIES:  CT scan shows a well-situated endovascular stent  graft without evidence of endo leak.   ASSESSMENT/PLAN:  1. Abdominal aortic aneurysm:  The patient is doing well at this time.      His incisions are well healed.  His 63-month CT scan shows no      evidence of endo leak.  I am scheduling him for a repeat CT scan at      3 months.  I will see him following the CT scan.  2. Unsteadiness:  Prior to his operation, I obtained carotid Dopplers      which showed 1 to 39% stenosis bilaterally.  Would like to have his      primary care physician further evaluate this.  3. Claudication:  The patient's right leg bothers him greater than the      left at approximately 100 feet.  He is being followed for this by      Dr. Eldridge Dace and Dr. Katrinka Blazing.  I will let him make recommendations      for management of his claudication.  Certainly, the patient has      more  pressing issues including his shortness of breath, chest pain      and unsteadiness prior to even entertaining doing something for his      legs.  I plan on seeing the patient back in 3 months after a CT      scan.   Jorge Ny, MD  Electronically Signed   VWB/MEDQ  D:  11/01/2008  T:  11/02/2008  Job:  [id number]   cc:   Lynelle Smoke I. Patsi Sears, M.D.  Candyce Churn, M.D.  Lyn Records, M.D.  Corky Crafts, MD

## 2011-03-13 NOTE — Cardiovascular Report (Signed)
NAME:  [name], Logan Allen NO.:  [id number]   MEDICAL RECORD NO.:  [id number]          PATIENT TYPE:  OIB   LOCATION:  1963                         FACILITY:  [facility]   PHYSICIAN:  Lyn Records, M.D.   DATE OF BIRTH:  [birth year]   DATE OF PROCEDURE:  03/07/2009  DATE OF DISCHARGE:  03/07/2009                            CARDIAC CATHETERIZATION   INDICATIONS FOR PROCEDURE:  The patient has a history of prosthetic  aortic valve replacement along with coronary artery bypass grafting.  The initial bypass procedure was done in 1994.  Redo CABG of  bioprosthetic valve was performed in 2004.  The patient has been  complaining of dyspnea, no chest pain.  The study is being done to  assess pulmonary artery pressures and to document bypass graft patency.   PROCEDURE PERFORMED:  Right femoral arterial stick.   DESCRIPTION:  The patient had no palpable pulse in the left femoral.  We  anesthetized the right femoral region.  The initial draping was  relatively low. Though we entered the artery on four separate occasions  with good blood return, we were never able to advance the wire beyond  the needle tip for some mysterious reason.  There was always good  arterial flash of blood back through the arterial needle, but we were  unable ever to advance the J wire or a Wholey wire into the femoral and  iliac.  After approximately 45 minutes of attempts to enter the central  aorta, the case was terminated.  We prepped the left groin.  We were  unable to palpate a good pulse in the left groin and because of the  length of time we had spent trying to get up his right leg, we decided  to terminate the case.   IMPRESSION:  Failure to perform left heart catheterization due to  inability to retrogradely enter the descending aorta in this patient  with prior stent graft.  This occurred despite good arterial punctures  with adequate arterial pulsatile flow from the needle.   PLAN:  We  reviewed the patient's data.  We will speak with Dr. Myra Gianotti  IV to determine if there is some anatomic reason why we were unable to  retrogradely cath the patient.  I will consider performing this  procedure from the patient's right arm as brachial case but getting  grafts may be difficult.       Lyn Records, M.D.  Electronically Signed     HWS/MEDQ  D:  03/07/2009  T:  03/08/2009  Job:  [id number]   cc:   Candyce Churn, M.D.

## 2011-03-13 NOTE — Procedures (Signed)
ENDOVASCULAR STENT GRAFT EXAM   INDICATION:  Followup evaluation of endovascular stent.   HISTORY:                           DUPLEX EVALUATION   AAA Sac Size:                 3.77 CM AP            3.89 CM TRV  Previous Sac Size:            CM AP                 CM TRV  Evidence of an endoleak?      No__________          No   Velocity Criteria:  Proximal Aorta                89 cm/sec  Proximal Stent Graft          84 cm/sec  Main Body Stent Graft-Mid     114 cm/sec  Right Limb-Proximal           110 cm/sec  Right Limb-Distal             148 cm/sec  Left Limb-Proximal            91 cm/sec  Left Limb-Distal              124 cm/sec  Patent Renal Arteries?        yes                   yes   IMPRESSION:  Patent endovascular stent with no evidence of focal  stenosis or endoleak.   Right lower extremity ABI suggests moderate arterial disease with  biphasic and monophasic Doppler waveform.   Left lower extremity ABI suggests mild to moderate arterial disease with  biphasic Doppler waveform.   Biphasic duplex waveform noted within stent and native artery.       ___________________________________________  V. Charlena Cross, MD   AC/MEDQ  D:  05/09/2009  T:  05/10/2009  Job:  [id number]

## 2011-03-13 NOTE — Assessment & Plan Note (Signed)
OFFICE VISIT   Logan Allen, Logan Allen  DOB:  [birth year]                                       02/07/2009  ZOXWR#:[id number]   REASON FOR VISIT:  Followup.   HISTORY:  This is an 77 year old gentleman that I saw at the request Dr.  Eldridge Dace and  Dr. Katrinka Blazing for evaluation of abdominal aortic aneurysm.  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  He underwent repair on September 29, 2008.  He comes in  today for follow-up.  He has also been suffering from right-sided  claudication.  This was an issue that we decided to address after his  aneurysm repair.  The patient states that his pain in his right leg is  not lifestyle limiting as he is not very active.  Otherwise he is doing  well without complaints.   PHYSICAL EXAMINATION:  VITAL SIGNS:  Blood pressure 155/63, pulse 93.  GENERAL:  Well-appearing in no distress.  ABDOMEN:  Soft, nontender, he does have a hernia in the inferior aspect  of his left chest incision.  His abdomen is otherwise soft.  EXTREMITIES:  Warm and well-perfused.  Incisions well-healed.   DIAGNOSTIC STUDIES:  CT scan performed day.  No evidence of endoleak.   ASSESSMENT/PLAN:  1. Status post aneurysm repair, now 6 months out.  The patient be      scheduled for abdominal ultrasound in 3 months. I will see him back      in 6 months with a CT scan.  2. With regards to his right leg claudication,  I do not recommend      proceeding with repair at this time as I do not feel like his      symptoms are that severe.  I will defer to Dr. Eldridge Dace as far as      timing of right leg intervention.   Jorge Ny, MD  Electronically Signed   VWB/MEDQ  D:  02/07/2009  T:  02/08/2009  Job:  [id number]   cc:   Corky Crafts, MD  Lyn Records, M.D.  Sigmund I. Patsi Sears, M.D.  Candyce Churn, M.D.

## 2011-03-13 NOTE — Discharge Summary (Signed)
NAME:  Logan Allen, Logan Allen                ACCOUNT NO.:  [id number]   MEDICAL RECORD NO.:  [id number]          PATIENT TYPE:  INP   LOCATION:  2009                         FACILITY:  [facility]   PHYSICIAN:  Juleen China IV, MDDATE OF BIRTH:  05-Jul-1924   DATE OF ADMISSION:  09/29/2008  DATE OF DISCHARGE:  10/07/2008                               DISCHARGE SUMMARY   ADMITTING DIAGNOSIS:  Abdominal aortic aneurysm.   FINAL DISCHARGE DIAGNOSES:  1. Abdominal aortic aneurysm status post stent graft repair.  2. Postoperative tachycardia, controlled with metoprolol.  3. Postoperative urinary retention, started on Flomax.  4. Deconditioning.  5. Mild hypokalemia, supplemented.  6. Acute blood loss anemia requiring transfusion.   ADDITIONAL DISCHARGE DIAGNOSES:  1. History of hypertension.  2. Coronary artery disease.  3. Prostate cancer.  4. Depression and anxiety.  5. Mild dementia.  6. Obstructive sleep apnea.  7. Gout.  8. Chronic obstructive pulmonary disease.  9. Hypothyroidism.  10.Cerebrovascular disease.   ALLERGIES:  PENICILLIN.   PROCEDURES:  September 29, 2008, endovascular repair of the abdominal  aortic aneurysm and common iliac aneurysm on the left with bilateral  common femoral artery exposure with catheter in the aorta x2, abdominal  aortogram and distal extension x1 by Dr. Venida Jarvis (main body  is a Gore Excluder 23 x 12 x 18, primary access was on the left,  contralateral leg is a Biomedical scientist 16 x 13.5, distal left extension is  a Biomedical scientist 10 x 7).   BRIEF HISTORY:  Logan Allen is an 77 year old Caucasian male who was being  evaluated for peripheral vascular disease and underwent a CT angiogram  which identified a 5.3 cm infrarenal abdominal aortic aneurysm and a  left common iliac aneurysm, measuring 3.9 cm.  He was evaluated for  endovascular repair and felt to be a good candidate.  He has an occluded  left hypogastric artery.  Dr. Myra Gianotti discussed  risks and benefits with  the patient, and surgery was scheduled for early December.   HOSPITAL COURSE:  Logan Allen was electively admitted to [facility] on September 29, 2008.  He underwent the previously mentioned  procedure.  Postoperatively, he was transferred to the Step-Down Unit  where he remained for the first few days after surgery.  He did require  transfusion with 2 units of packed red blood cells intraoperatively.  In  addition, he was rather deconditioned postoperatively and required  Physical Therapy and Rehab Medicine consult.  Initially, it was felt he  might need short-term skilled nursing care placement but ultimately was  deemed appropriate for discharge with plans for outpatient physical  therapy.  Postoperatively, Logan Allen was also treated for postoperative  tachycardia.  Heart rate was high in the 120s.  He was started on IV  metoprolol and was transitioned to low-dose Lopressor by discharge.  Heart rate was in sinus rhythm, primarily in the 80s-90s by discharge.  He also had some difficulty with urinary retention and had a reinsertion  of his Foley catheter.  He was started on Flomax, and we were ultimately  able to discontinue the catheter although he used condom catheter for  several days due to some urinary incontinence/dribbling.  His  preoperative urinalysis was within normal limits.  By October 07, 2008,  Logan Allen had been on telemetry unit 2000 for several days.  He was  making progress with physical therapy.  He was ambulating in the hallway  with a rolling walker.  His feet were warm.  His incisions were healing  well without signs of infection.  His bowels were functioning.  He was  tolerating a regular food.  He was only requiring Tylenol for pain.  His  vitals were stable.  Most recent vitals before discharge showed a  temperature 97.3, heart rate of 82, blood pressure of 120/73, and oxygen  saturations 94% on room air.  He did still have some  scrotal edema but  this was felt to be improving.  He was ultimately deemed appropriate for  discharge on October 07, 2008, with plans for him to continue Flomax  and metoprolol at discharge and to follow up with his primary physician  and urologist within the next month or so.  Of note, the patient did  decline home health physical therapy and instead wanted outpatient  physical therapy, so he and his wife could go together.  He also  declined the need for any home health nurse.  Logan Allen was felt to be  stable and in improving condition at discharge.   DISCHARGE MEDICATIONS:  1. Aricept 10 mg p.o. daily.  2. Allopurinol 100 mg daily.  3. Cymbalta 30 mg daily.  4. Zetia 10 mg daily.  5. Robinul 1 mg daily.  6. Quinine sulfate 324 mg p.o. nightly.  7. Aspirin 81 mg daily.  8. Os-Cal 500 mg p.o. b.i.d.  9. Valium 5 mg p.o. nightly.  10.Namenda 10 mg p.o. b.i.d.  11.Pletal 50 mg p.o. b.i.d.  12.Flomax 0.4 mg p.o. nightly.  13.Metoprolol 25 mg one half tablet p.o. b.i.d.  14.Tylenol per packet instructions p.r.n. pain.   DISCHARGE INSTRUCTIONS:  He is to continue a heart-healthy diet.  Increase activity slowly.  May shower and clean incisions gently with  soap and water.  Avoid driving or heavy lifting for the next couple of  weeks.  See Dr. Myra Gianotti in 3-4 weeks with a CT angio.  He should call  sooner if he has a fever or signs of infection or increased pain.  He  was given a 29-month prescription of Flomax and instructed to follow up  with his urologist, Dr. Patsi Sears, to see if he felt this would need to  be a long-term medication.  He was also instructed to follow up with his  family physician, Dr. [name], to reevaluate his heart rate and  blood pressure to determine if it was felt he would require long-term  metoprolol.   LABORATORY DATA:  Labs at discharge showed a white count of 9.9,  hemoglobin 10.6, hematocrit 31.6, platelet count of 145.  Sodium 138,  potassium  3.3, glucose of 107, BUN of 12, creatinine of 1.02.  Preoperative liver function tests were normal.  His total protein was  decreased at 4.9, blood albumin 2.5.      Jerold Coombe, P.A.      Jorge Ny, MD  Electronically Signed    AWZ/MEDQ  D:  10/07/2008  T:  10/08/2008  Job:  [id number]   cc:   Candyce Churn, M.D.  Sigmund I. Patsi Sears, M.D.

## 2011-03-13 NOTE — Discharge Summary (Signed)
NAME:  Logan Allen, Logan Allen NO.:  [id number]   MEDICAL RECORD NO.:  [id number]          PATIENT TYPE:  INP   LOCATION:  3738                         FACILITY:  [facility]   PHYSICIAN:  Candyce Churn, M.D.DATE OF BIRTH:  01-Oct-1924   DATE OF ADMISSION:  05/05/2009  DATE OF DISCHARGE:                               DISCHARGE SUMMARY   DISCHARGE DIAGNOSES:  1. Junctional tachycardia converted to normal sinus rhythm, likely      secondary to medical noncompliance with discontinuation of beta-      blocker therapy, which has been reinstituted.  2. Bronchitis to be treated as an outpatient with Levaquin 250 mg      daily for 5 days and Mucinex  600 mg b.i.d. for 10 days.  3. Coronary artery disease status post coronary artery bypass graft in      1994 and redo in 2004 with aortic valve replacement with a      bioprosthetic valve.  4. Peripheral vascular disease with claudication.  5. Hyperlipidemia.  6. Chronic obstructive pulmonary disease.  7. History of prostate cancer.  8. Mild dementia.  9. Aortic stenosis status post Porcine aortic valve replacement.  10.Status post abdominal aortic aneurysm repair.  11.Cerebrovascular disease.  12.Hypothyroidism.  13.Anxiety/depression.  14.Gout.  15.Obstructive sleep apnea.  16.Tachycardia as above.  17.History of urinary incontinence and urgency followed by Dr. Jethro Bolus.   DISCHARGE MEDICATIONS:  1. Allopurinol 100 mg daily.  2. Aricept 10 mg at bedtime.  3. Cymbalta 30 mg b.i.d.  4. Quinine sulfate 324 mg p.r.n. leg cramping.  5. Os-Cal D 1 p.o. b.i.d.  6. Namenda 10 mg b.i.d.  7. Lopressor 50 mg b.i.d.  8. Aspirin 81 mg daily.  9. Zetia 10 mg daily.  10.Valium 10 mg daily.  11.Pletal 50 mg b.i.d.  12.Advair 250/50 one inhalation b.i.d.  13.Levaquin 250 mg daily for 5 days.  14.Mucinex 600 mg orally a.m. and p.m. x10 days.   HOSPITAL COURSE:  Logan Allen is a very pleasant 77 year old male who  presented to my office yesterday with tachycardia.  Heart rate was 130-  140 and EKG revealed a junctional tachycardia.  He later reported that  he had not been on his beta-blocker for several days, which he has been  on chronically for years.  The patient was also complaining of right  lower extremity pain and was tender in his right calf and he was also  admitted to rule out DVT.   Lower extremity Dopplers revealed no evidence of DVT, but he did have a  left lower extremity Baker cyst.   D-dimers were mildly elevated on admission and CT angio of the chest  revealed no pulmonary embolism.   The patient was started back on metoprolol with a 100 mg dose to start  then 50 mg b.i.d., is now in sinus rhythm at 70 and feels well except  for congested cough.  He is producing yellow-green phlegm and breath  sounds are coarse consistent with bronchitis, but no rales with no  symptoms of orthopnea to suggest the  CHF.   Laboratories while admitted reveal a white count of 11,700, hemoglobin  11.4.  Sodium 139, potassium 4.3, chloride 103, bicarb 26, BUN 15,  creatinine 1.25, glucose 91.  LFTs were normal.  Albumin 3.4.  Troponin  was 0.02.  CK-MB 2.6, CK 184 and 176.  D-dimers were slightly elevated  at 1.36.  B12 level was 311 and TSH normal at 0.819.  CT angiogram of  the chest revealed no evidence of pulmonary embolus, extensive  atherosclerotic changes with evidence for previous CABG.  There was a 6-  mm pleural based pulmonary nodule noted and this will be followed up as  an outpatient.   CONDITION ON DISCHARGE:  Improved with control of heart rate and  bronchitis, will be treated as an outpatient as above.      Candyce Churn, M.D.  Electronically Signed     Candyce Churn, M.D.  Electronically Signed    RNG/MEDQ  D:  05/06/2009  T:  05/06/2009  Job:  [id number]

## 2011-03-13 NOTE — Cardiovascular Report (Signed)
NAME:  Logan Allen, HAGGART NO.:  [id number]   MEDICAL RECORD NO.:  [id number]          PATIENT TYPE:  OIB   LOCATION:  2899                         FACILITY:  [facility]   PHYSICIAN:  Lyn Records, M.D.   DATE OF BIRTH:  06-Jun-1924   DATE OF PROCEDURE:  03/24/2009  DATE OF DISCHARGE:  03/24/2009                            CARDIAC CATHETERIZATION   INDICATIONS FOR THE STUDY:  Persistent exertional dyspnea present over  the past 2-3 years.  The study is being sent to document graft patency  and evaluate LV hemodynamics.   PROCEDURE PERFORMED:  1. Left heart cath.  2. Selective coronary angio.  3. Left ventriculography.  4. Bypass graft angiography.  5. LIMA graft angiography.   DESCRIPTION:  Prepping the patient higher than the inguinal crease on  the right was performed after visualization of the femoral head by  fluoroscopy.  The patient has a large panniculus that on previous  catheterizations have led to malposition of the procedure site.   Xylocaine was used, 1%, for local anesthesia.  A Smart needle was used  to identify the artery, and on 1 pass the artery was fit with the Smart  needle.  There was difficulty getting the tip of the wire to end of the  artery, but this was eventually accomplish by slightly retracting the  Smart needle.  We were then able to easily progress the J-wire above the  region of endovascular stent in the abdominal aorta.  We then used a 6-  [place] A2 multipurpose catheter for hemodynamic recordings of the aortic  pressure and LV pressure.  The catheter easily crossed the prosthetic  aortic valve (stent was tissue valve).  Left ventriculography was  performed by hand injection.  The catheter was pulled back across the  aortic valve without demonstration of a gradient.  We performed right  coronary angiography with the A2 multipurpose catheter and also bypass  graft angiography.  We then used a #4 6-French left Judkins catheter for  left  coronary angiography.  We used a 6-French internal mammary artery  catheter for internal mammary artery angiography.  The patient tolerated  the procedure without complications.   RESULTS:  1. Hemodynamic data:      a.     Aortic pressure 161/65 mmHg.      b.     Left ventricular pressure 165/11 mmHg prior to the left       ventriculography and 170/15 mmHg after contrast.  2. Left ventriculography:  LV cavity size is normal.  Heavy mitral      annular calcification is noted.  Hypercontractile LV function is      noted.  EF is greater than 65%.  No MR is noted.  3. Coronary angiography:      a.     Left main coronary:  Distal 50-60% narrowing with some       catheter wedging noted and pressure damping noted.      b.     Left anterior descending coronary artery:  This vessel was       widely patent with  an eccentric 50% ostial narrowing.  There is       competitive flow in the mid-LAD due to the patent LIMA graft.      c.     Circumflex artery:  Circumflex gives origin to a large       distal vascular bed which gives origin to 3 obtuse marginal       branches.  Proximal to these obtuse marginal branches, there is a       full long segmental 80-85% stenosis in the midvessel.  This is the       culprit vessel that was bypassed previously and has had 2       saphenous vein grafts to occlude.      d.     Right coronary:  Totally occluded in the midvessel after the       acute marginal branch.  4. Bypass graft angiography.      a.     Saphenous vein graft to the obtuse marginal:  Totally       occluded at the aorto-ostial junction.      b.     Saphenous vein graft to the right coronary:  Widely patent       with mild-to-moderate luminal irregularities noted in the       midvessel with up to 40% narrowing.  5. Left internal mammary graft angiography:  Widely patent with clear      reflux of contrast antegrade and retrograde in the LAD back to the      level of the left main.    CONCLUSION:  1. Bypass graft failure with occlusion of the graft to the circumflex.  2. Patent saphenous vein graft to the right coronary artery and patent      left internal mammary graft to the left anterior descending.  3. Severe native vessel coronary disease with total occlusion of the      right coronary artery, moderately severe distal left main, and high-      grade mid circumflex disease threatening a moderate territory in      the inferolateral wall with a segmental 75% stenosis.  The native      left anterior descending is patent and has a patent left internal      mammary artery to it.  4. Normal left ventricular function with ejection fraction greater      than 65% but with a normal left ventricular end-diastolic pressure      both pre and postcontrast administration.   COMMENTS AND PLAN:  With the patient's primary symptom being dyspnea on  exertion and in the absence of demonstrable ischemia in the circumflex  territory by a relatively recent nuclear scintigraphy, I do not feel  that further advancement to PCI in the circumflex territory is  necessarily indicated.  The patient's level of dyspnea is unchanged over  the last 3 years and is likely related to something other than his  native coronary circumflex territory that is unprotected at this time.  Should he follow up angina or an ischemic zone in this territory, then  consideration of circumflex PCI would be reasonable.      Lyn Records, M.D.  Electronically Signed     HWS/MEDQ  D:  03/24/2009  T:  03/25/2009  Job:  [id number]   cc:   Candyce Churn, M.D.

## 2011-03-13 NOTE — Op Note (Signed)
NAME:  Logan Allen, Logan Allen                ACCOUNT NO.:  [id number]   MEDICAL RECORD NO.:  [id number]          PATIENT TYPE:  AMB   LOCATION:  SDS                          FACILITY:  [facility]   PHYSICIAN:  Juleen China IV, MDDATE OF BIRTH:  [birth year]   DATE OF PROCEDURE:  09/21/2008  DATE OF DISCHARGE:                               OPERATIVE REPORT   PREOPERATIVE DIAGNOSIS:  Abdominal aortic aneurysm.   POSTOPERATIVE DIAGNOSIS:  Abdominal aortic aneurysm.   PROCEDURES PERFORMED:  1. Ultrasound access, right femoral artery.  2. Abdominal aortogram.  3. Catheter in aorta x1.   INDICATIONS:  This is an 77 year old gentleman with an infrarenal  abdominal aortic aneurysm.  By CT scan, his left iliac artery was  occluded.  He will require coverage of his left hypogastric artery with  a stent graft, therefore he comes in today to confirm left internal  iliac occlusion.  Risks and benefits were discussed.  Informed consent  was signed.   PROCEDURE:  The patient was identified in the holding and taken to room  8.  He was placed supine on the table.  Bilateral groins were prepped  and draped in standard sterile fashion.  Time-out was called.  Lidocaine  1% was used for local anesthesia.  The right femoral artery was  evaluated with ultrasound and found to be widely patent, minimally  calcified.  It was accessed under ultrasound guidance with an 18-gauge  needle.  An 0.35 Teena Dunk wire was advanced in retrograde fashion to the  abdominal aorta under fluoroscopic visualization.  A 5-French sheath was  placed.  Over the wire, a marked pigtail catheter was advanced at the  level of L1.  Abdominal aortogram was obtained.  Next, the catheter was  pushed up to T12 and a visceral angiogram was performed.  The catheter  was then pulled down to the aortic bifurcation and multiple obliques of  the pelvic vasculature were obtained.   FINDINGS:  Aortogram:  Visualized portions of suprarenal abdominal  aorta  showed minimal disease.  The penetrating ulcer visualized on CT scan is  not well seen.  Bilateral renal arteries were patent.  There is an  infrarenal abdominal aortic aneurysm.  There is aneurysmal degeneration  of the left common iliac artery.  The left external iliac artery is  patent.  The left hypogastric artery is occluded.  The right common  iliac artery is patent.  The right external iliac artery is patent.  The  right hypogastric artery is patent and cross fills to the left  hypogastric territory.   After the above images were obtained, decision made to terminate the  procedure.  Catheters and wires were removed.  The patient was taken to  holding area for sheath pull.   IMPRESSION:  1. Infrarenal abdominal aortic aneurysm.  2. Occluded left internal iliac artery.           ______________________________  V. Charlena Cross, MD  Electronically Signed     VWB/MEDQ  D:  09/21/2008  T:  09/21/2008  Job:  [id number]

## 2011-03-13 NOTE — Assessment & Plan Note (Signed)
OFFICE VISIT   Logan Allen, GRAZIANI  DOB:  [birth year]                                       08/26/2008  EAVWU#:[id number]   REASON FOR REFERRAL:  Aneurysm.   HISTORY:  This is an 77 year old gentleman I am seeing at the request of  Dr. Eldridge Dace for evaluation of abdominal aortic aneurysm.  The patient  has been seeing Dr. Eldridge Dace for peripheral vascular disease which has  been managed medically.  He recently underwent a CT angiogram which  identified his aneurysm.  His aneurysm measures 5.3 cm in infrarenal  location.  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  He has diffuse disease throughout the visualized portions of his aorta  including a possible penetrating ulcer in his suprarenal segment.  He is  asymptomatic at this time.  He does have a family history of his father  having had an abdominal aneurysm repaired.  The patient does have  significant coronary history.  He is followed by Dr. Katrinka Blazing for this.  He  does not have active angina.   REVIEW OF SYSTEMS:  GENERAL:  Negative for fevers, chills, weight gain  or weight loss.  CARDIAC:  Is positive for shortness of breath with exertion.  PULMONARY:  Positive for wheezing.  GI:  Negative.  GU:  Frequent urination and history of prostate cancer.  VASCULAR:  Pain in legs with walking.  NEURO:  Negative.  ORTHO:  Negative.  PSYCHIATRIC:  Positive for depression and nervousness.  HEENT:  Recent change in eyesight.   PAST MEDICAL HISTORY:  Hypertension, history of MI,  hypercholesterolemia, COPD, prostate cancer, peripheral vascular  disease.   FAMILY HISTORY:  Positive for coronary artery disease.   SOCIAL HISTORY:  He is married with two children.  He does not smoke.  He has a history of smoking but quit 15 years ago.  Does not drink  alcohol on a regular basis.   MEDICATIONS:  1. Include Aricept 10 mg per day.  2. Allopurinol 100 mg per day.  3. Cymbalta 30 mg per day.  4.  Namenda 10 mg b.i.d.  5. Zetia 10 mg per day.  6. Glycopyrrolate.  7. Quinine.  8. Aspirin 81 mg per day.  9. Os-Cal.  10.Valium.  11.Pletal 50 mg b.i.d.   ALLERGIES:  Are penicillin.   PHYSICAL EXAMINATION:  Vital signs:  Blood pressure is 151/79, pulse is  115.  General:  He is well-appearing and in no distress.  HEENT:  Normocephalic, atraumatic.  Pupils equal.  Sclerae anicteric.  Neck:  Is  supple.  No JVD.  Cardiovascular:  Regular rate and rhythm.  Pulmonary:  Lungs are clear.  Abdomen:  Is soft, nontender.  No pulsatile mass  palpable.  He has bilateral palpable femoral pulses.  Extremities:  Are  warm and well-perfused.  Mild edema, no ulceration.  Neurological:  Cranial nerves II-XII are grossly intact.  Psychiatric:  He is alert and  oriented x3.   DIAGNOSTIC STUDIES:  The patient's CT scan has been reviewed.  The  patient also has lower extremity duplex which reveals ankle brachial  index of 0.5 on the right and 0.6 on the left.  Cardiolite stress test  from May reveals negative for ischemia and normal LV function.   ASSESSMENT:  Abdominal aortic and common iliac aneurysm.   PLAN:  I had a long conversation today discussing this risks and  benefits of proceeding with aneurysm repair.  We discussed the risk of  death, kidney dysfunction, intestinal ischemia, worsening lower  extremity vascular disease and pulmonary complications as well as  cardiac problems.  The patient understands these risks and wishes to  proceed.   Based on CT scan the patient's left internal iliac artery is occluded.  The patient will require landing the graft into the left external iliac  artery and therefore I want to ensure that his internal iliac on the  left is occluded and for that reason I think we need to proceed with an  arteriogram to provide better imaging of his internal iliac artery on  the left.  If it does appear to be patent he will need to undergo coil  embolization.  The  patient has a cruise that has been scheduled and  therefore we will do his procedure following when he comes back.  He is  scheduled for diagnostic arteriogram with the possibility of  embolization of his left hypogastric artery on Tuesday, November 24.  He  will be scheduled for his aneurysm repair on Wednesday, December 2.   Jorge Ny, MD  Electronically Signed   VWB/MEDQ  D:  08/26/2008  T:  08/27/2008  Job:  [id number]   cc:   Vonzell Schlatter. Patsi Sears, M.D.  Candyce Churn, M.D.  Lyn Records, M.D.  Corky Crafts, MD

## 2011-03-13 NOTE — Procedures (Signed)
CAROTID DUPLEX EXAM   INDICATION:  Preop for aortic aneurysm repair and history of left common  carotid endarterectomy.   HISTORY:  Diabetes:  No.  Cardiac:  Yes.  Hypertension:  Yes.  Smoking:  Quit 50 years ago.  Previous Surgery:  Left carotid endarterectomy.  CV History:  Amaurosis Fugax Yes No, Paresthesias Yes No, Hemiparesis Yes No                                       RIGHT             LEFT  Brachial systolic pressure:  Brachial Doppler waveforms:  Vertebral direction of flow:        Antegrade         Antegrade  DUPLEX VELOCITIES (cm/sec)  CCA peak systolic                   88                71  ECA peak systolic                   90                110  ICA peak systolic                   53 (mid)          111 (mid)  ICA end diastolic                   50                33  PLAQUE MORPHOLOGY:                  Heterogenous      Heterogenous  PLAQUE AMOUNT:                      Mild              Mild  PLAQUE LOCATION:                    ICA and ECA       ICA, CCA and ECA   IMPRESSION:  1. 1-39% stenosis noted in bilateral ICAs.  2. Status post left carotid endarterectomy.  Antegrade bilateral      vertebral arteries.   ___________________________________________  V. Charlena Cross, MD   MG/MEDQ  D:  08/26/2008  T:  08/26/2008  Job:  [id number]

## 2011-03-16 NOTE — Cardiovascular Report (Signed)
NAME:  Logan Allen, Logan Allen [facility]                       ACCOUNT NO.:  [id number]   MEDICAL RECORD NO.:  [id number]                   PATIENT TYPE:  OIB   LOCATION:  2857                                 FACILITY:  [facility]   PHYSICIAN:  Lesleigh Noe, M.D.            DATE OF BIRTH:  November 10, 1923   DATE OF PROCEDURE:  04/30/2003  DATE OF DISCHARGE:                              CARDIAC CATHETERIZATION   INDICATIONS:  CHF, aortic stenosis documented by echocardiogram, history of  coronary artery disease with bypass surgery 1994.   PROCEDURE PERFORMED:  1. Left heart catheterization.  2. Right heart catheterization.  3. Selective coronary angiogram.  4. Saphenous vein graft angiography.  5. Internal mammary artery angiography.   DESCRIPTION OF PROCEDURE:  After informed consent, a 7-French sheath was  inserted into the right femoral artery and an 8-French sheath into the right  femoral vein.  A 6-French A2 multipurpose catheter was used for left heart  hemodynamic recordings including crossing the aortic valve.  A 300 cm long  straight wire was used to cross the aortic valve.  A 7-French Swan-Ganz  catheter was used for right heart hemodynamic recordings and thermodilution  cardiac output determination.   Following the hemodynamic recordings we performed bypass graft angiography  using the multipurpose catheter.  We used a left and right Judkins catheter  for left and right coronary angiography and a mammary catheter for left  internal mammary artery angiography.  The patient tolerated the procedure  without complications.   RESULTS:   I. HEMODYNAMIC DATA:  A.  Right atrial mean pressure 20 mmHg.  B.  Biventricular pressure 33/8 mmHg.  C.  Pulmonary artery pressure 32/15.  D.  Pulmonary capillary wedge mean 17 mmHg.  E.  Aortic pressure 171/75 mmHg.  F.  Left ventricular pressure 224/19 mmHg.  G.  Peak gradient 53 mmHg, mean gradient 44 mmHg.  H.  Cardiac output by thermodilution 6.6  mmHg.  A. Aortic valve area 0.9 sq cm.   II. LEFT VENTRICULOGRAPHY:  __________ opacification of the left ventricle  is noted.  EF greater than 70%.   III. CORONARY ANGIOGRAPHY:  A.  Left main coronary artery:  Heavily  calcified with 75% distal stenosis.  B.  Left anterior descending coronary artery:  Heavily calcified and  diffusely diseased with 70% mid vessel stenosis.  C.  Circumflex artery:  90% mid vessel stenosis.  Three small obtuse  marginal branches followed by a large fourth obtuse marginal that has  competitive flow with a saphenous vein graft that supplies it.  There is  also a fifth obtuse marginal branch that is large and free of any  significant obstruction.  D.  Right coronary:  Small in caliber.  80% mid vessel narrowing.  Competitive flow distally with the graft to the right coronary.   IV. BYPASS GRAFT ANGIOGRAPHY:  A.  Saphenous vein graft to the fourth obtuse  marginal branch:  Ostial 50% narrowing, otherwise patent.  B.  Saphenous vein graft to the right coronary:  This graft is degenerated  and contains a 95%+ stenosis in the mid vessel.   V. INTERNAL MAMMARY GRAFT TO THE LAD:  This graft is widely patent.   CONCLUSION:  1. Severe aortic stenosis with a calculated aortic valve of 0.9 sq cm and a     peak aortic valve gradient of 53 mmHg.  2. Normal left ventricular function.  3. Saphenous vein graft degenerative disease with 95% stenosis in the graft     to the right and 50% stenosis in the ostium of the graft to the obtuse     marginal.  4. Widely patent left internal mammary artery to the left anterior     descending.  5. Severe native vessel coronary disease with 75% left main, 70% left     anterior descending, 90% circumflex, and 80% mid right coronary artery.                                               Lesleigh Noe, M.D.    HWS/MEDQ  D:  04/30/2003  T:  04/30/2003  Job:  [id number]  Candyce Churn, M.D.  [address] [place]  [place] [address]  Fax: [phone]   CVTS   cc:   Candyce Churn, M.D.  [address] [place]  [place] [address]  Fax: [phone]   CVTS

## 2011-03-16 NOTE — Consult Note (Signed)
NAME:  Logan Allen, Logan Allen [facility]                       ACCOUNT NO.:  [id number]   MEDICAL RECORD NO.:  [id number]                   PATIENT TYPE:  OIB   LOCATION:  3704                                 FACILITY:  [facility]   PHYSICIAN:  Gwenith Daily. Tyrone Sage, M.D.            DATE OF BIRTH:  1923/12/28   DATE OF CONSULTATION:  05/02/2003  DATE OF DISCHARGE:                                   CONSULTATION   REQUESTING PHYSICIAN:  Lyn Records, M.D.   FOLLOW-UP CARDIOLOGIST:  Lyn Records, M.D.   FAMILY CARE PHYSICIAN:  Dr. [name].   REASON FOR CONSULTATION:  Critical aortic stenosis and recurrent coronary  artery disease.   HISTORY OF PRESENT ILLNESS:  The patient is a 77 year old male who had  undergone coronary artery bypass grafting in 1995.  At that time, left  internal mammary was placed to the LAD, vein graft was placed to the distal  right, vein graft to the distal circumflex, and a concomitant left carotid  endarterectomy was performed.  The patient had done well until at least the  last six months he has had progressive symptoms of shortness of breath with  exertion and over the past several weeks, increasing shortness of breath  with very minimal exertion just walking around in his house.  He has known  aortic stenosis, documented on previous echocardiograms.  Echocardiogram  done January 2004 showed an aortic valve area of 1.1 and a mean gradient of  43.  The patient denies any pedal edema.  He denies syncope but does have  orthostatic presyncopal episodes if he stands quickly.  He denies orthopnea  or PND.  He denies any previous myocardial infarction, denies previous  angioplasty.   Cardiac risk factors include hypertension, treated hyperlipidemia.  He  denies diabetes.  He has been a smoker in the past but quit more than 10  years ago.  He has had no previous stroke but as noted above had a carotid  endarterectomy done in 1995 concomitantly with CAD.  He denies  claudication,  denies renal insufficiency.   PAST MEDICAL HISTORY:  1. Prostate cancer.  2. Hyperthyroidism but under no current treatment.   PAST SURGICAL HISTORY:  Prostatectomy in 1988 with rising PSAs to 28 to 30.  He was started on Lupron and ultimately an orchiectomy.  Two weeks ago, he  notes his PSA was 0.1.   SOCIAL HISTORY:  The patient is retired from [employer].  He is married and  has two children.   MEDICATIONS:  1. Aspirin 81 mg a day.  2. Cardizem CD 180 mg a day.  3. Fosamax 10 mg a day.  4. Zocor 40 mg a day.  5. Casodex 50 mg q.h.s.  6. Valium 10 mg q.h.s.  7. Os-Cal 500 b.i.d.   ALLERGIES:  PENICILLIN.   REVIEW OF SYSTEMS:  GENERAL:  The patient denies chest pain, denies resting  shortness of breath.  Has very marked exertional shortness of breath with  minimal exertion.  Positive for presyncope.  He denies orthopnea, syncope,  palpitations or lower extremity edema.  CONSTITUTIONAL:  The patient denies  any constitutional symptoms, denies fever or chills.  He has an overall  significant decline in his ability of physical activity.  RESPIRATORY:  As  noted above.  GASTROINTESTINAL:  He denies any blood in his stool or change  in bowel habits.  NEUROLOGIC:  He denies TIAs or amaurosis.  MUSCULOSKELETAL:  Ambulates well without joint pain.  GU:  As noted above,  history of prostate cancer.  Currently notes that he is able to void without  difficulty.  INFECTIOUS:  Denies any recent infections.  HEMATOLOGIC:  He  has had no obvious infections and denies any fever or chills.  ENDOCRINE:  The patient's chart notes a history of hyperthyroidism but he has no  symptoms of this and is not currently being treated.  PSYCHIATRIC:  History  is positive for anxiety disorder on chronic Valium therapy.  CARDIOVASCULAR:  Denies claudication.  HEENT:  Denies changes in vision.  He does have a  partial lower plate and denies any difficulty with his teeth at this time.  He  last saw a dentist approximately nine months ago.   PHYSICAL EXAMINATION:  GENERAL:  The patient appears his stated age, in no  acute distress.  VITAL SIGNS:  Blood pressure 120/70, sinus rhythm at 85, O2 sat on room air  95, weight is 214 pounds.  HEENT:  Pupils are equal, round and reactive to light.  NECK:  With bilateral right bruits from the aortic stenosis.  He has no  jugular venous distention.  CHEST:  His lungs are clear bilaterally.  CARDIAC:  Reveals harsh, 3/6 holosystolic murmur heard throughout the  precordium consistent with aortic stenosis.  ABDOMEN:  Moderately obese without palpable masses or organomegaly.  EXTREMITIES:  He has superficial varicosities in both lower extremities.  Previously, vein was harvested from the right lower extremity, none from the  left.  NEUROLOGIC:  Grossly intact.  He has no palpable lymph nodes in the  cervical, supraclavicular or axillary areas.  SKIN:  Without ischemic changes.  VASCULAR:  Lower extremity vascular exam reveals +1 DP and PT pulses  bilaterally.   LABORATORY DATA:  Include a white count of 11.8, hemoglobin 13.6, hematocrit  38.6, platelet count 246, INR of 1.05, PTT 36.  Sodium 138, potassium 4.3,  BUN 18, creatinine 1.3 on April 23, 2003.  Cardiac catheterization findings  reveal PA pressures of 32/15, wedge pressure of 17, aortic valve area  calculated at 0.9 with a peak gradient of 53 and a mean of 44, ejection  fraction of 60%.  The patient has diffuse native coronary disease with at  least 70% distal left main involving the LAD, 90% mid circumflex lesion, a  small right coronary artery with 80% lesion and competitive flow from vein  graft.  He has a patent mammary to the LAD, a patent vein graft to the right  coronary artery with a mid 95% lesion.  There is a patent vein graft to the  circumflex system with ostial narrowing.  Preoperative Doppler studies show no significant stenosis in the carotid arteries.  He has  decreased ABIs in  both lower extremities, 0.82.   IMPRESSION AND RECOMMENDATIONS:  A 77 year old male with stable prostate  cancer who presents with very limiting symptoms of dyspnea on exertion with  critical aortic stenosis and progressive  coronary disease in vein grafts.  With the patient's symptoms, degree of stenosis, and vein graft disease, to  continue with active lifestyle, redo coronary artery bypass grafting and  aortic valve replacement probably with a tissue valve would be recommended.  I have discussed this with the patient, including the risks and options  involved.  He is aware of the increased risk of surgery related to his age  and redo status, but because of his progressive symptoms, is willing to  proceed with  operative intervention.  While in the hospital, the patient has had some  nonsustained VT and with his critical stenosis would recommend staying in  the hospital until arrangements with one of my partners to do to surgery  could be done, later in the coming week.  In the meantime, also we will  check the patient's thyroid status.                                               Gwenith Daily Tyrone Sage, M.D.    Tyson Babinski  D:  05/02/2003  T:  05/03/2003  Job:  [id number]

## 2011-03-16 NOTE — Procedures (Signed)
[facility]  Patient:    Logan Allen, Logan Allen Visit Number: [id number] MRN: [id number]          Service Type: Attending:  Verlin Grills, M.D. Proc. Date: 08/25/01   CC:         Pearla Dubonnet, M.D.                           Procedure Report  PROCEDURE PERFORMED:  Colonoscopy and polypectomy.  ENDOSCOPIST:  Verlin Grills, M.D.  INDICATIONS:  [name] (date of birth 06/26/1924) is a 77 year old male who is due for his first surveillance colonoscopy with polypectomy to prevent colon cancer.  I discussed with Logan Allen the complications associated with colonoscopy and polypectomy including a 15:1000 risk of bleeding and 4:1000 risk of colon perforation requiring emergency surgery.  Logan Allen has signed the operative permit.  Logan Allen has undergone surgery and radiation therapy for prostate cancer.  PREMEDICATIONS:  Demerol 50 mg and Versed 15 mg.  ENDOSCOPE:  Olympus pediatric colonoscope.  DESCRIPTION OF PROCEDURE:  After obtaining informed consent, Logan Allen was placed in the left lateral decubitus position.  I administered intravenous Versed and intravenous Demerol to achieve conscious sedation for the procedure.  The patients blood pressure, oxygen saturation, and cardiac rhythm were monitored throughout the procedure and documented in the medical record.  Anal inspection was normal.  Digital rectal exam revealed an absent prostate.  The Olympus pediatric video colonoscope was introduced into the rectum and advanced to the cecum.  Colonic preparation for the exam today was excellent.  Rectum normal.  Sigmoid colon normal.  Descending colon, splenic flexure, and transverse colon from the mid descending colon two 2 mm sessile polyps were removed with the electrocautery snare and submitted for pathologic interpretation.  From the mid transverse colon, a 2 mm sessile polyp was removed with the electrocautery and submitted for  pathological interpretation. All three of these small polyps were submitted in one bottle for evaluation.  Hepatic flexure normal.  Ascending colon normal.  Cecum and ileocecal valve normal.  ASSESSMENT: 1. Extensive left colonic diverticulosis. 2. From the descending colon two 2 mm sessile polyps removed with    the electrocautery snare. 3. From the mid-transverse colon, a 2 mm sessile polyp was    removed with the electrocautery snare. Attending:  Verlin Grills, M.D. DD:  08/25/01 TD:  08/26/01 Job: [id number]

## 2011-03-16 NOTE — Op Note (Signed)
NAME:  Logan Allen, Logan Allen                          ACCOUNT NO.:  [id number]   MEDICAL RECORD NO.:  [id number]                   PATIENT TYPE:  INP   LOCATION:  2304                                 FACILITY:  [facility]   PHYSICIAN:  Evelene Croon, M.D.                  DATE OF BIRTH:  04/17/1924   DATE OF PROCEDURE:  05/06/2003  DATE OF DISCHARGE:                                 OPERATIVE REPORT   PREOPERATIVE DIAGNOSIS:  Critical aortic stenosis and severe saphenous vein  graft disease, status post coronary artery bypass graft surgery in 1994.   POSTOPERATIVE DIAGNOSIS:  Critical aortic stenosis and severe saphenous vein  graft disease, status post coronary artery bypass graft surgery in 1994.   PROCEDURE:  Redo median sternotomy, extracorporeal circulation, redo  coronary artery bypass graft surgery x2 using saphenous vein grafts to the  obtuse marginal branch of the left circumflex coronary artery and the  posterior descending branch of the right coronary artery.  Aortic valve  replacement using a 21 mm [place] stent with porcine valve.  Endoscopic vein  harvesting from the left leg.   SURGEON:  Evelene Croon, M.D.   ASSISTANT:  Toribio Harbour, N.P.   ANESTHESIA:  General endotracheal.   INDICATIONS FOR PROCEDURE:  This patient is a 77 year old gentleman with a  known history of aortic stenosis and coronary artery disease who underwent  coronary artery bypass graft surgery by Ines Bloomer, M.D. in 1994.  He  had a left internal mammary artery graft to the LAD and saphenous vein  grafts to the distal left circumflex and the right coronary artery.  The  patient also had a left carotid endarterectomy at the time of his coronary  artery bypass graft surgery.  He recently presented with increasing symptoms  of congestive heart failure with exertional dyspnea with minimal levels of  exertion.  He underwent repeat cardiac catheterization which showed a patent  left internal mammary  artery graft to the LAD.  The saphenous vein graft to  the right coronary artery had a 99% new graft stenosis and was diffusely  diseased.  The saphenous vein graft to the obtuse marginal was patent with  about a 50% ostial stenosis.  His native left main had about 75% ostial  stenosis and was calcified.  The LAD had about 70% midstenosis.  There was a  first diagonal that had about 70% stenosis.  The left circumflex had 85%  midstenosis and the right coronary artery had 70% midstenosis.  The gradient  across the aortic valve was about 60 mmHg.  The aortic valve area was  calculated at 0.9 cmsq.  Cardiac output was 6.6.  The left ventricular  ejection fraction was greater than 70%.  After review of the angiogram and  examination of the patient it was felt that the best treatment would be redo  coronary artery bypass graft surgery and  aortic valve replacement.  I  discussed the operative procedure with the patient including alternatives,  benefits, and risks including bleeding, blood transfusion, infection,  stroke, myocardial infarction, and death.  I discussed the valve options  available for him and felt that a tissue valve would be the best choice  given his age of 77 years.  He was in agreement with that and agreed to  proceed.   DESCRIPTION OF PROCEDURE:  The patient was taken to the operating room and  placed on the table in the supine position. After induction of general  endotracheal anesthesia, a Foley catheter was placed in the bladder using  sterile technique.  Then the chest, abdomen, and both lower extremities were  prepped and draped in the usual sterile fashion.  The chest was entered  through the previous median sternotomy incision.  The sternum was opened  using the oscillating saw without difficulty.  The sternal edges were  retracted and the heart and mediastinal structures dissected from the back  of the sternum.   A transesophageal echocardiogram was performed and  showed critical aortic  stenosis.  Left ventricular function was well preserved.  There was trivial  mitral regurgitation.   Then the greater saphenous vein was harvested from the left thigh using  endoscopic vein harvest technique.  This was medium to a large caliber vein  and good quality.   Then the right atrium and ascending aorta were exposed.  The patient was  then heparinized and when an adequate activated clotting time was achieved,  the distal ascending aorta was cannulated using a 20 French aortic cannula  for arterial inflow. Venous outflow was achieved using a two-stage venous  cannula through the right atrium. An antegrade cardioplegia and vent cannula  was inserted into the aortic root.   The patient was placed on cardiopulmonary bypass and the remainder of the  heart dissected from the pericardial adhesions.  Then a left ventricular  vent was placed through the right superior pulmonary vein and the retrograde  cardioplegia cannula through the right atrium into the coronary sinus.  The  left internal mammary artery graft was identified as it entered the  pericardium and was encircled and carefully preserved.   Then an atraumatic vascular clamp was placed across the left internal  mammary artery pedicle. The aorta was then crossclamped and 800 mL of cold  blood antegrade cardioplegia was administered into the aortic root with  quick arrest of the heart.  Systemic hypothermia to 28 degrees cGy and  topical hypothermia with iced saline was used.  A temperature probe was  placed in the septum and insulating pad in the pericardium.  Additional  doses of retrograde cardioplegia were given in about 30 minute intervals  maintaining myocardial temperature around 10 degrees cGy.   The first distal anastomosis was performed to the posterior descending  coronary artery. The internal diameter was 1.6 mm.  The conduit used was a segment of greater saphenous vein and the anastomosis  performed in an end-to-  side manner using continuous 7-0 Prolene suture.  Flow was measured through  the graft and was excellent.   A second distal anastomosis was performed to the obtuse marginal branch. The  internal diameter was about 2 mm.  The conduit used was a second segment of  greater saphenous vein and the anastomosis performed in an end-to-side  manner using a continuous 7-0 Prolene suture.  The flow was measured through  the graft and was excellent.  Then attention was turned to aortic valve replacement.  The aortic root did  have significant calcified plaque particularly anteriorly.  The aortic root  was open at the level of the sinotubular junction.  Examination of the valve  showed it was a three-leaflet valve that was markedly calcified.  The  leaflets were excised.  The annulus was decalcified with rongeurs.  Care was  taken to remove all particulate debris.  The left ventricle was irrigated  with iced saline.  The right and left coronary ostia were identified.  Both  appeared significantly narrowed due to calcified plaque.  The annulus was  sized and a 21 mm pericardial valve sizer would not fit.  Therefore, I felt  that a stentless valve would be the best choice for this patient.  A 21 mm  stentless porcine valve was then chosen.  Then a series of 4-0 Ethibond  sutures were placed around the aortic annulus.  The sutures were placed  through the edge of the sew-in cuff on the valve.  The valve was then  lowered into place and the sutures were tied sequentially.  Then the three  commissural struts were then sutured to the aortic wall using 5-0 Prolene  sutures with pledgets on the outside of the aorta.  Then the distal suture  line was completed by running the 5-0 Prolene sutures from one commissural  post to the other.  The suture lines appeared intact.  The coronary ostia  were not narrowed by the sutures.  The valve appeared to seat nicely. There  were no injuries  to the leaflet.  Then the patient was rewarmed to 37  degrees cGy. The aorta was then closed in two layers using continuous 4-0  Prolene suture with felt strips.  Closure was reinforced with BioGlue since  the aorta was of poor quality and calcified.  Then the two proximal vein  graft anastomoses were performed.  The proximal anastomosis of the obtuse  marginal vein graft was performed to the old hood of the obtuse marginal  vein graft.  This appeared to be the least diseased area of the aortic root.  The proximal anastomosis of the right coronary artery vein graft was  performed to the hood of the old right coronary artery vein graft which was  really not diseased much at all.  Graft markers were placed around these  proximal anastomoses.  Then the left side of the heart was deaired.  The  patient was placed in Trendelenburg position.  The clamp was removed from  the mammary pedicle.  There was rapid warming of the ventricular septum and return of spontaneous ventricular fibrillation.  The crossclamp was then  removed with a time of 207 minutes. There was spontaneous return of sinus  rhythm.   The proximal and distal anastomoses appeared hemostatic and the line of the  grafts were satisfactory.  The aortic suture line was hemostatic.  Then two  temporary right ventricular and right atrial pacing wires were placed and  brought out through the skin. When the patient had rewarmed to 37 degrees  cGy, he was weaned from cardiopulmonary bypass on low dose Dopamine.  Total  bypass time was 253 minutes.  Cardiac function appeared excellent with a  normal functioning aortic valve prosthesis.  There was no significant mitral  regurgitation and left ventricular function appeared well preserved.  There  was no evidence of perivalvular leaflet regurgitation through the aortic  valve prosthesis.   Then Protamine was given  and hemostasis achieved.  The patient was given  Aprotinin for this case.   Three chest tubes were placed with two in the  posterior pericardium, one in the right pleural space and one in the  anterior mediastinum.  The sternum was then closed with #6 stainless steel  wires. The fascia was closed with a continuous #1 Vicryl suture.  The  subcutaneous tissue was closed with continuous 3-0 Vicryl and the skin with  3-0 Vicryl subcuticular closure. The lower extremity vein harvest site was  closed in layers in a similar manner.  The needle, sponge, and instrument  count correct according to the scrub nurse.  Dry sterile dressings were  applied over the incisions, around the chest tube which were hooked to  Pleurovac suction. The patient remained hemodynamically stable and was  transported to the SICU in guarded, but stable condition.                                               Evelene Croon, M.D.    BB/MEDQ  D:  05/06/2003  T:  05/07/2003  Job:  [id number]

## 2011-03-16 NOTE — Discharge Summary (Signed)
NAME:  Logan Allen, Logan Allen                          ACCOUNT NO.:  [id number]   MEDICAL RECORD NO.:  [id number]                   PATIENT TYPE:  INP   LOCATION:  2004                                 FACILITY:  [facility]   PHYSICIAN:  Evelene Croon, M.D.                  DATE OF BIRTH:  [birth year]   DATE OF ADMISSION:  04/30/2003  DATE OF DISCHARGE:  05/16/2003                                 DISCHARGE SUMMARY   ANTICIPATED DATE OF DISCHARGE:  May 16, 2003.   ADMISSION DIAGNOSIS:  Severe aortic valve stenosis and coronary artery  disease.   PAST MEDICAL HISTORY:  1. Coronary artery disease, status post CABG 1994.  A left carotid     endarterectomy was also performed at that time.  He is status post     exercise treadmill test January 2004, which was negative for ischemia and     positive for limited exertional tolerance, secondary to claudication and     exertional fatigue.  No induced arrhythmias.  2-D echocardiogram in     January 2004 revealed severe aortic stenosis with mean gradient of 44     mmHg, calculated valve area of 1 centimeter-squared.  Normal left     ventricular size.  Moderate left atrial enlargement, no MR.  2. COPD.  3. Hyperlipidemia.  4. History of prostate cancer, followed by Dr. Patsi Sears.  Status post     multiple procedures, stable at this point.  5. Hypertension.  6. Anxiety.  7. History of hypothyroidism, no current medications.   ALLERGIES:  PENICILLIN.   DISCHARGE DIAGNOSIS:  1. Severe aortic valve stenosis and recurrent and progressive coronary     artery disease, status post redo coronary artery bypass grafting and     aortic valve replacement.  2. Postoperative atrial fibrillation, resolved.   BRIEF HISTORY:  Logan Allen is a 77 year old Caucasian man.  He was doing well  in his usual state of health to approximately the last 6 months when he  began having progressive symptoms of shortness of breath with exertion.  He  presented to his cardiologist's  office, Dr. Katrinka Blazing, on April 23, 2003 with  complaints that he had shortness of breath with minimal exertion.  He was  not having any chest pain with this.  He also complained of orthostatic  light-headedness.  Dr. Katrinka Blazing recommended proceeding with a cardiac  catheterization.  Logan Allen was agreeable to this, and he was scheduled for  admission to the hospital on April 30, 2003.   HOSPITAL COURSE:  On April 30, 2003, Logan Allen underwent cardiac  catheterization by Dr. Katrinka Blazing.  The results of the catheterization included:  1. Severe aortic stenosis with calculated aortic valve of 0.9 centimeters-     squared.  Peak aortic valve gradient 53 mmHg.  2. Normal LA function.  3. Saphenous venous graft degenerative disease with 95% stenosis to the  right graft and 50% stenosis to the ostium of the OM graft.  The left     mammary artery graft was widely patent.  4. He seems to have severe 3-vessel native coronary disease.   He was referred to [facility] for consideration of surgical intervention.  He was  evaluated on May 02, 2003 by Dr. Sheliah Plane.  After examination and  review of the patient's records, Dr. Tyrone Sage agreed with redo coronary  artery bypass grafting in addition to aortic valve replacement was the  preferred treatment choice of this gentleman.  Dr. Dennie Maizes recommendation  was to keep Logan Allen until the hospital until surgery could be scheduled  (he experienced severe episodes of nonsustained ventricular tachycardia  while hospitalized).   Surgery was scheduled for May 06, 2003 with Dr. Evelene Croon.  On May 05, 2003, Dr. Laneta Simmers spoke with Logan Allen and again reviewed the procedure,  risks and benefits, answered all of his questions, and Logan Allen agreed to  proceed with surgery on the morning of May 06, 2003.   On May 06, 2003, Logan Allen underwent the following surgical procedures with  Dr. Evelene Croon.  1. Redo coronary artery bypass grafting x 2.  Grafts placed at the time  of     procedure:  Saphenous venous graft to the circumflex artery, saphenous     venous graft to the posterior descending coronary artery.  The vein was     harvested from the left side with EndoView harvesting technique.  2. Aortic valve replacement with a 22 mm stentless porcine prosthesis.   He tolerated these procedures well and was transferred in stable condition  to the SICU.  He remained hemodynamically stable in the immediate  postoperative period.  He was extubated several hours after arrival in the  unit.  He awoke from anesthesia neurologically intact.  Logan Allen underwent  routine care in the Intensive Care Unit and was transferred to the Unit 2000  on postoperative day 1.  Dr. Laneta Simmers did make a note that Coumadin therapy  will not be required because of use of the stentless porcine valve.   In the early morning hours of postoperative day 4, Logan Allen developed  atrial fibrillation with rapid ventricular rate (110-140).  Amiodarone  therapy was initiated by Cardiology.  He converted to normal sinus rhythm  later in the day and has remained in sinus rhythm ever since that time.  His  amiodarone dosage has been decreased, and he will go home on 200 mg q.d.   Also, on postoperative day 4, Logan Allen spiked a temp of 103 degrees  Fahrenheit.  The source was thought to be his central line.  This was  removed.  By the morning of postoperative day 5, his temp was 99 degrees  Fahrenheit.  He did, however, develop some erythema at his left thigh  incision.  He also had a significant hematoma in his thigh.  His white blood  cell count on May 12, 2003, was 18.6.  Antibiotic therapy was initiated  initially with Avelox, but that was changed to Zithromax because his being  on amiodarone.  Over the next severe days his leukocytosis resolved as did  his left leg erythema and hematoma.  Overall, Logan Allen is making good progress and recovering from his surgery.  The morning of May 15, 2003, postoperative day 9 his vital signs are  stable.  Blood pressure 100/56.  He is afebrile.  His room air saturation is  95%.  He is maintaining normal sinus rhythm at approximately 80 beats per  minute.  His lungs are clear.  His bowel or bladder function within normal  limits for him.  His chest incision is healing very well.  His left thigh  hematoma is improving.  There is much less erythema at the incision site.  He is ambulating independently in his room and with minimal assistance in  the hallway.  His pain is well-controlled.   If Logan Allen remains stable, it is anticipated he will be ready for  discharge home tomorrow, May 16, 2003.   He will go home on oral antibiotics and will be seen early in followup with  Dr. Laneta Simmers.   LABORATORY STUDIES:  May 15, 2003:  1. CBC:  White blood cell 16.6.  Hemoglobin 9.1.  Hematocrit 26.4.     Platelets 131,000.  CBC will be repeated the morning of July 18.  2. Chemistries:  Sodium 139.  Potassium 4.2.  BUN 13.  Creatinine 1.1.     Glucose 103.   CONDITION ON DISCHARGE:  Improved.   INSTRUCTIONS ON DISCHARGE:  Include:   MEDICATIONS:  1. Zithromax 250 mg p.o. q.d. x 10 days.  2. Amiodarone 200 mg p.o. q.d.  3. Lasix 20 mg p.o. q.d. next 5 days.  4. Potassium chloride 10 mEq p.o. q.d. for 5 days.  5. Lopressor 50 mg p.o. b.i.d.   RESUME FOLLOWING HOME MEDICATIONS:  1. Aspirin 325 mg daily, this is a new dose for him.  2. Zocor 40 mg daily.  3. Fosamax 10 mg daily.  4. Calcium 500 plus D daily.   PAIN MANAGEMENT:  He may have Ultram 50 mg 1-2 p.o. q.4-6h. for marked  severe pain or Tylenol 325 mg 1-2 p.o. q.4-6h. for mild pain.   ACTIVITY:  He is asked to refrain from any driving or any heaving lifting,  pushing, or pulling.  He is also instructed to continue his breathing  exercises and daily walking.   DIET:  Heart healthy diet.   WOUND CARE:  He may shower daily with mild soap and water.  If his incisions  show any  signs of infection or if he has a fever greater than 101 degrees  Fahrenheit, he is to call Dr. Sharee Pimple office.   FOLLOW UP:  1. Dr. Laneta Simmers at the [facility] Office on Tuesday, May 25, 2003, at 1:45.  He     will have a chest x-ray at 12:45 that day.  2. Dr. Katrinka Blazing would like to see the patient in his office in approximately 2     weeks.  He has been asked to call to arrange that appointment.      Toribio Harbour, N.P.                  Evelene Croon, M.D.    CTK/MEDQ  D:  05/15/2003  T:  05/17/2003  Job:  [id number]   cc:   Evelene Croon, M.D.  [address]  [place]  [place] [address]  Fax: [phone]   Lyn Records III, M.D.  [address]  [place]  [place] [address]  Fax: [phone]   Candyce Churn, M.D.  [address] [place]  [place] [address]  Fax: [phone]    cc:   Evelene Croon, M.D.  [address]  [place]  [place] [address] Fax: [phone]   Lyn Records III, M.D.  [address]  [address]  [place]  [place] [address]  Fax: [phone]   Candyce Churn, M.D.  [address] [place]  [place] [address]  Fax: [phone]

## 2011-03-16 NOTE — Discharge Summary (Signed)
NAME:  Logan Allen, NOMURA NO.:  [id number]   MEDICAL RECORD NO.:  [id number]          PATIENT TYPE:  INP   LOCATION:  4742                         FACILITY:  [facility]   PHYSICIAN:  Candyce Churn, M.D.DATE OF BIRTH:  [birth year]   DATE OF ADMISSION:  06/26/2005  DATE OF DISCHARGE:  07/03/2005                                 DISCHARGE SUMMARY   DISCHARGE DIAGNOSES:  1.  Positional vertigo, improved but persistent.  2.  Gait disorder secondary to #1.  3.  Hypertension.  4.  History of coronary artery disease.  5.  Prostate cancer.  6.  Depression/anxiety.  7.  Mild dementia.  8.  History of obstructive sleep apnea.  9.  Gout.  10. Chronic obstructive pulmonary disease.  11. Hypothyroidism.  12. Cerebrovascular disease.   DISCHARGE MEDICATIONS:  1.  Cymbalta 20 mg daily - decreasing from 60 mg daily.  2.  Casodex 50 mg daily.  3.  Allopurinol 100 mg daily.  4.  Aricept 10 mg at bedtime.  5.  Lopressor 25 mg one-half tablet p.o. b.i.d.  6.  Namenda 10 mg p.o. b.i.d.  7.  Aspirin 81 mg daily.  8.  Ditropan XL 10 mg at bedtime.  9.  Prednisone 30 mg daily on July 04, 2005, 20 mg on July 05, 2005      and 10 mg on July 06, 2005 then discontinue.   Patient is not to take Detrol LA.   DISCHARGE INSTRUCTIONS:  No driving until seen in the office.  Ambulate with  a quad cane at all times.   PROCEDURES:  1.  MRI/MRA of the brain revealed stable advanced age atrophy and chronic      white matter ischemic change.  No other acute intracranial abnormality.      Normal MRA of the circle of Willis.  No acute stroke.  2.  2-dimensional echocardiogram performed June 27, 2005:  Revealed      ejection fraction of 55 to 65%.  There is a bioprosthetic aortic valve.      Moderate mitral annular calcification.  Mild mitral valvular      regurgitation.  Left atrium moderately dilated.   LABORATORY DATA:  On July 02, 2005 the sodium revealed 137,  potassium  3.9, chloride 105, bicarb 25, glucose 128, BUN 20, creatinine 1.1, calcium  8.4.  White count 14,100 - elevated after prednisone taper started,  hemoglobin 11.5, platelets are clumped but counts appear adequate.  Liver  function tests on June 26, 2005 were within normal limits.  Homocysteine  level on June 27, 2005 was 13.73.  CK on June 27, 2005 was 39. CK-MB was  0.7, troponin 0.01.  TSH normal at 1.566 on June 27, 2005.  Iron studies  are within normal limits on June 27, 2005.  Folate greater than 20,  Ferritin 156, B12 level was 507- within normal limits and methylmalonic acid  was 0.15 micromoles per liter, normal being 0.00 to 0.40.   HOSPITAL COURSE:  Mr. Domanick Cuccia is a very pleasant 77 year old male with  history of coronary artery disease, aortic  valve replacement, prostate  cancer who presented around 2:10 P.M. with dizziness, after some falls.  He  had reached up to get a tool in his tool shed, felt quite dizzy and light  headed and collapsed to the floor and suffered a skin tear on his right  wrist.  He rolled over and attempted to get up on his knees to stand up  again and fell back again striking his posterior head on the cement floor.  No loss of consciousness particularly.  He complained of vertigo in the  emergency room.   Vertigo throughout the hospital tended to be positional.  It did improve  some with steroid taper and Meclizine but not entirely by the time of  discharge.   While admitted he had no further near syncopal events and had no tachy or  bradyarrhythmia's.   Carotid Doppler's were also performed on June 27, 2005 and though he had  bilateral mild to moderate  heterogeneous plaque in the common carotid  arteries, there was no significant internal carotid artery stenosis and  vertebral artery flow was antegrade.  He did have a right external carotid  artery stenosis.   Medication reviews were performed and it was decided that  perhaps Cymbalta  could be contributing to his dizziness and this has been reduced in dose at  the time of discharge to 20 mg daily and will continue to follow as an  outpatient.  Will also refer him for vestibular dysfunction physical therapy  as an outpatient through [facility].   He was seen by physical therapy on July 02, 2005 and felt like he could  ambulate safely with a quad cane, especially if he carefully moved from  position to position and concentrated on focal point with moving position to  position.   He should not drive until further instructed by me.      Candyce Churn, M.D.  Electronically Signed     RNG/MEDQ  D:  07/03/2005  T:  07/03/2005  Job:  [id number]

## 2011-03-16 NOTE — Procedures (Signed)
NAME:  Logan Allen, Logan Allen NO.:  [id number]   MEDICAL RECORD NO.:  [id number]          PATIENT TYPE:  OUT   LOCATION:  SLEEP CENTER                 FACILITY:  [facility]   PHYSICIAN:  Clinton D. Maple Hudson, M.D. DATE OF BIRTH:  1924/09/13   DATE OF STUDY:  08/23/2004                              NOCTURNAL POLYSOMNOGRAM   INDICATION FOR THE STUDY:  This is a 77 year old patient of Dr. Avie Echevaria  who has undergone a polysomnogram at the [facility].  The  referring physician is Dr. Avie Echevaria.  Mr. Grow is described as a  gentleman with a weight of 195 pounds at a height of 6 feet 1 inch.  The  study was dated August 23, 2004.   [name] polysomnogram showed appropriate sleep architecture for age and  gender.  The absence of delta sleep is expected in a male patient his age.  Sleep latency was slightly prolonged at 33.5 minutes.  REM latency was  slightly decreased in comparison to peer group average at 80 minutes.  The  patient had a sleep efficiency of 79% of the recorded time and showed  evidence of mild obstructive sleep apnea.  No central apneas were noticed.  Lowest oxygen desaturation during the night was 77%.  The longest  respiratory event was almost scored at 1 minute duration at 59.6 seconds.  This occurred during non-REM sleep.   CONCLUSION:  1.  Mild obstructive sleep apnea and hypopnea syndrome with associated      desaturation of oxygen to 77%.  2.  Infrequent periodic leg movement arousals; 5.9 was the arousal index.  3.  Apnea/hypopnea index was 11.6.   RECOMMENDATIONS:  CPAP titration is recommended.  Please schedule with Carlena Sax  at [facility] for Dr. Avie Echevaria.                                      Porfirio Mylar Dohmeier, M.D.Clinton D. Maple Hudson, M.D.  Dip   CD/MEDQ  D:  10/04/2004 15:15:38  T:  10/04/2004 16:49:47  Job:  [id number]

## 2011-03-16 NOTE — H&P (Signed)
NAME:  Logan Allen, Logan Allen NO.:  [id number]   MEDICAL RECORD NO.:  [id number]          PATIENT TYPE:  EMS   LOCATION:  MAJO                         FACILITY:  [facility]   PHYSICIAN:  Marcene Duos, M.D.DATE OF BIRTH:  June 08, 1924   DATE OF ADMISSION:  06/26/2005  DATE OF DISCHARGE:                                HISTORY & PHYSICAL   CHIEF COMPLAINT:  Dizziness with falls.   HISTORY OF PRESENT ILLNESS:  This is a 77 year old male with history of  coronary artery disease, aortic valve replacement, prostate cancer, and  hypertension who presents with an episode around 2:10 p.m. of dizziness  followed by some falls. The patient states that he had gone out to his shed,  was reaching up with his right hand and looking up to get a drill and became  quite dizzy or lightheaded and collapsed to the floor. He tore his skin on  the right wrist with this initial fall. He rolled over and attempted to get  up on his knees and stand again and fell back again striking his posterior  head on the cement floor. The patient states that he had no loss of  consciousness; however, he did eventually have some nausea. He denied any  associated chest pain, shortness of breath, focal numbness or weakness, or  palpitations. The patient does state that he has had tachypalpitations in  the past for which he underwent a 24-hour Holter monitor that was negative.  It sounds like that was in the past year.   The patient did have a fair amount of bleeding from his scalp contusion and  the abrasion on his arm according to his wife. He required actually being  lifted from a lying position to the gurney as he was too dizzy to get up  himself. Sugar in the field was 90s. He was placed on IV fluids and given  oxygen in the ED and states he started to feel better at that point. He was  noted to have a systolic blood pressure drop of almost of 20 mm with sitting  from a lying position. In the emergency  department, his rectal exam was heme  negative.   The patient gives a history of repeated stumbling and falls with dizziness  in the past six months. He apparently has been to neurology and cardiology  with this complaint. He has had MRI and MRA June 2006 with no acute changes.  He is uncertain of any other workup, though. Apparently he saw Dr. Lyn Records for his tachypalpitations and echocardiogram apparently was okay as  was the 24-hour Holter monitor. He also has been very sleepy and fatigue in  the past three months. During the history taking, it was not clear when his  symptomatology started versus when medications had been started with regards  to his symptomatology.   PAST MEDICAL HISTORY:  1.  Hypertension.  2.  Coronary artery disease.  3.  Prostate cancer.  4.  Depression/anxiety.  5.  Obstructive sleep apnea.  6.  Gout.  7.  COPD.  8.  History  of hypothyroidism.  9.  Postoperative atrial fibrillation.  10. Dementia.  11. Low back pain in the past week. No injury.   PAST SURGICAL HISTORY:  1.  CABG.  2.  Right carotid endarterectomy in 1994.  3.  CABG/AVR 2004.  4.  Radical prostatectomy with external radiation treatment in 1988.  5.  Bilateral orchectomy and removal of penile prosthesis with circumcision      October of 2001.  6.  Umbilical hernia repair.  7.  Leg cramps.   MEDICATIONS:  1.  Calcium with D 500 mg b.i.d.  2.  Cymbalta 60 mg daily. This has been started in the last several months.  3.  Casodex 50 mg daily.  4.  Valium 10 mg q.h.s., which he stopped four days ago.  5.  Allopurinol 100 mg daily.  6.  Quinine 325 mg q.h.s.  7.  Aricept 10 mg daily.  8.  Lopressor 50 mg 1/2 tablet b.i.d.  9.  Ditropan XL 10 mg daily.  10. Detrol LA 4 mg daily.  11. Namenda 10 mg b.i.d.  12. Aspirin 81 mg daily.  13. Tamoxifen 20 mg daily.  14. He is also on C-PAP at night.   ALLERGIES:  PENICILLIN causes swelling.   HABITS:  Tobacco use:  Smoked up to  2+ packs per day for 45 years. He quit  in 1994. Alcohol: No use.   FAMILY HISTORY:  Mother died at age 56 uremic poisoning. She was always  underweight and had nerves. Father died at age 38 with CHF. Also sounds  like he had jaundice and either liver cancer or liver metastases. Four  sisters-one sister with dementia arthritis, two sisters had breast cancer.  Two brothers with no specific health problems. One brother died at age 36 of  unknown cause. Two stepchildren-one of whom is my patient, Milas Kocher.   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 Married 19 years to his second wife.   LABORATORY DATA:  White count 9.4, hemoglobin 11.6, platelets appeared  adequate. Electrolytes were normal save for creatinine of 2.1 with a BUN of  22. TIAs negative for injury. EKG with no ischemic changes. Chest x-ray, I  have not been able to evaluate at this point.   PHYSICAL EXAMINATION:  VITAL SIGNS:  Temperature 97.6, blood pressure 132/61  lying. Blood pressure 119/46 sitting, pulse 71, respirations 16. SAO2 99% on  room air.  HEENT:  Pupils are equal, round, and reactive to light. Extraocular  movements intact. Contusion 5 cm posterior head without crepitation. Throat  without injection. Neck is supple without adenopathy.  CHEST:  Clear save for some crackles right lower lung field, base sound dry  and resonant to percussion over this same area.  CARDIOVASCULAR:  Regular rate and rhythm with a 2/6 systolic ejection murmur  that radiates to the right second interspace. Decreased peripheral pulses of  the feet and they are cool to touch. Radial pulses are equal and  normodynamic. He does have a right carotid bruit.  ABDOMEN:  Soft and nontender. No organomegaly or masses are appreciated.  RECTAL:  Per ER was heme negative. No mass. NEUROLOGICAL:  Alert and oriented x3. Cranial nerves II through XII grossly  intact. No focal findings.    ASSESSMENT/PLAN:  1.  Dizziness with fall, basically presyncope with some orthostasis on exam:      Uncertain etiology, possibly dehydration and it may be multifactorial      with  medication interactions or side-effects. Consider Aricept, Namenda,      Ditropan, Detrol, as possible factors. Because he is on both Ditropan      and Detrol, I am going to hold the Detrol for now. We will also hold his      quinine and Valium as he has not taken the Valium in the last four days      anyway and is having significant fatigue and sleepiness. We will place      him on the telemetry unit. Cardiac enzymes and EKG in the morning.      Intravenous fluids with a BMET in the morning for rehydration purposes.      Echocardiogram and carotid Doppler's with his history of looking up      prior to the onset of this. His symptoms were not vertiginous in nature      as well.  2.  Anemia: A factor in problem #1 as well. Uncertain what his baseline is.      Check B12 studies, iron studies.  3.  Recent fatigue and sleepiness:  Again, question whether this could be      from medication related to his obstructive sleep apnea. We will hold      Valium, follow his hemoglobin, check TSH, titrate him with C-PAP.  4.  Obstructive sleep apnea as above.  5.  Hypertension, controlled.  6.  Low back pain:  The patient brings this up during the exam. We will      check an x-ray.  7.  Normal chest exam:  Check chest x-ray. We will add a PA and lateral for      the morning as well to get a better look.           ______________________________  Marcene Duos, M.D.     EMM/MEDQ  D:  06/26/2005  T:  06/26/2005  Job:  [id number]

## 2011-05-31 ENCOUNTER — Emergency Department (HOSPITAL_COMMUNITY): Payer: Medicare Other

## 2011-05-31 ENCOUNTER — Emergency Department (HOSPITAL_COMMUNITY)
Admission: EM | Admit: 2011-05-31 | Discharge: 2011-05-31 | Disposition: A | Discharge status: Home / Self Care | Payer: Medicare Other | Attending: Emergency Medicine | Admitting: Emergency Medicine

## 2011-05-31 DIAGNOSIS — M549 Dorsalgia, unspecified: Secondary | ICD-10-CM | POA: Insufficient documentation

## 2011-05-31 DIAGNOSIS — Y92009 Unspecified place in unspecified non-institutional (private) residence as the place of occurrence of the external cause: Secondary | ICD-10-CM | POA: Insufficient documentation

## 2011-05-31 DIAGNOSIS — S060X0A Concussion without loss of consciousness, initial encounter: Secondary | ICD-10-CM | POA: Insufficient documentation

## 2011-05-31 DIAGNOSIS — J4489 Other specified chronic obstructive pulmonary disease: Secondary | ICD-10-CM | POA: Insufficient documentation

## 2011-05-31 DIAGNOSIS — S51009A Unspecified open wound of unspecified elbow, initial encounter: Secondary | ICD-10-CM | POA: Insufficient documentation

## 2011-05-31 DIAGNOSIS — I10 Essential (primary) hypertension: Secondary | ICD-10-CM | POA: Insufficient documentation

## 2011-05-31 DIAGNOSIS — I2510 Atherosclerotic heart disease of native coronary artery without angina pectoris: Secondary | ICD-10-CM | POA: Insufficient documentation

## 2011-05-31 DIAGNOSIS — Z7982 Long term (current) use of aspirin: Secondary | ICD-10-CM | POA: Insufficient documentation

## 2011-05-31 DIAGNOSIS — G8929 Other chronic pain: Secondary | ICD-10-CM | POA: Insufficient documentation

## 2011-05-31 DIAGNOSIS — IMO0002 Reserved for concepts with insufficient information to code with codable children: Secondary | ICD-10-CM | POA: Insufficient documentation

## 2011-05-31 DIAGNOSIS — S0100XA Unspecified open wound of scalp, initial encounter: Secondary | ICD-10-CM | POA: Insufficient documentation

## 2011-05-31 DIAGNOSIS — W010XXA Fall on same level from slipping, tripping and stumbling without subsequent striking against object, initial encounter: Secondary | ICD-10-CM | POA: Insufficient documentation

## 2011-05-31 DIAGNOSIS — J449 Chronic obstructive pulmonary disease, unspecified: Secondary | ICD-10-CM | POA: Insufficient documentation

## 2011-05-31 DIAGNOSIS — Z79899 Other long term (current) drug therapy: Secondary | ICD-10-CM | POA: Insufficient documentation

## 2011-07-31 LAB — POCT I-STAT, CHEM 8
BUN: 18
Calcium, Ion: 1.21
HCT: 39
Hemoglobin: 13.3
Sodium: 140
TCO2: 27

## 2011-08-03 LAB — COMPREHENSIVE METABOLIC PANEL
ALT: 13 U/L (ref 0–53)
ALT: 16 U/L (ref 0–53)
Albumin: 3 g/dL — ABNORMAL LOW (ref 3.5–5.2)
Alkaline Phosphatase: 69 U/L (ref 39–117)
BUN: 9 mg/dL (ref 6–23)
CO2: 27 mEq/L (ref 19–32)
Calcium: 7.6 mg/dL — ABNORMAL LOW (ref 8.4–10.5)
Calcium: 8.8 mg/dL (ref 8.4–10.5)
Creatinine, Ser: 1.14 mg/dL (ref 0.4–1.5)
GFR calc non Af Amer: >60 mL/min (ref >60)
Glucose, Bld: 120 mg/dL — ABNORMAL HIGH (ref 70–99)
Glucose, Bld: 124 mg/dL — ABNORMAL HIGH (ref 70–99)
Potassium: 3.5 mEq/L (ref 3.5–5.1)
Sodium: 134 mEq/L — ABNORMAL LOW (ref 135–145)
Sodium: 140 mEq/L (ref 135–145)
Total Protein: 5.4 g/dL — ABNORMAL LOW (ref 6.0–8.3)

## 2011-08-03 LAB — PROTIME-INR
INR: 1 (ref 0.00–1.49)
Prothrombin Time: 13.6 seconds (ref 11.6–15.2)

## 2011-08-03 LAB — BASIC METABOLIC PANEL
CO2: 27 mEq/L (ref 19–32)
Chloride: 103 mEq/L (ref 96–112)
Chloride: 107 mEq/L (ref 96–112)
Creatinine, Ser: 1.02 mg/dL (ref 0.4–1.5)
GFR calc Af Amer: >60 mL/min (ref >60)
GFR calc Af Amer: >60 mL/min (ref >60)
Glucose, Bld: 105 mg/dL — ABNORMAL HIGH (ref 70–99)
Sodium: 138 mEq/L (ref 135–145)
Sodium: 141 mEq/L (ref 135–145)

## 2011-08-03 LAB — POCT I-STAT 7, (LYTES, BLD GAS, ICA,H+H)
Calcium, Ion: 1.12 mmol/L (ref 1.12–1.32)
Hemoglobin: 9.2 g/dL — ABNORMAL LOW (ref 13.0–17.0)
O2 Saturation: 100 %
Patient temperature: 36.1
Potassium: 4 mEq/L (ref 3.5–5.1)
TCO2: 30 mmol/L (ref 0–100)
pCO2 arterial: 37.4 mmHg (ref 35.0–45.0)
pH, Arterial: 7.493 — ABNORMAL HIGH (ref 7.350–7.450)
pO2, Arterial: 411 mmHg — ABNORMAL HIGH (ref 80.0–100.0)

## 2011-08-03 LAB — CBC
HCT: 34.6 % — ABNORMAL LOW (ref 39.0–52.0)
HCT: 35.7 % — ABNORMAL LOW (ref 39.0–52.0)
Hemoglobin: 10.6 g/dL — ABNORMAL LOW (ref 13.0–17.0)
Hemoglobin: 11.7 g/dL — ABNORMAL LOW (ref 13.0–17.0)
Hemoglobin: 12 g/dL — ABNORMAL LOW (ref 13.0–17.0)
Hemoglobin: 12.1 g/dL — ABNORMAL LOW (ref 13.0–17.0)
MCHC: 33.6 g/dL (ref 30.0–36.0)
MCHC: 33.8 g/dL (ref 30.0–36.0)
MCHC: 34.6 g/dL (ref 30.0–36.0)
MCV: 88.8 fL (ref 78.0–100.0)
MCV: 89.6 fL (ref 78.0–100.0)
MCV: 89.7 fL (ref 78.0–100.0)
Platelets: 135 10*3/uL — ABNORMAL LOW (ref 150–400)
RBC: 3.56 MIL/uL — ABNORMAL LOW (ref 4.22–5.81)
RBC: 3.86 MIL/uL — ABNORMAL LOW (ref 4.22–5.81)
RBC: 3.99 MIL/uL — ABNORMAL LOW (ref 4.22–5.81)
RDW: 13.1 % (ref 11.5–15.5)
RDW: 13.6 % (ref 11.5–15.5)
RDW: 14.3 % (ref 11.5–15.5)
WBC: 9.9 10*3/uL (ref 4.0–10.5)

## 2011-08-03 LAB — BLOOD GAS, ARTERIAL
Bicarbonate: 27.1 mEq/L — ABNORMAL HIGH (ref 20.0–24.0)
Drawn by: 181601
FIO2: 0.21 %
O2 Saturation: 92.4 %
Patient temperature: 98.6
pH, Arterial: 7.435 (ref 7.350–7.450)

## 2011-08-03 LAB — URINALYSIS, ROUTINE W REFLEX MICROSCOPIC
Glucose, UA: NEGATIVE mg/dL
Protein, ur: NEGATIVE mg/dL
Specific Gravity, Urine: 1.017 (ref 1.005–1.030)
Urobilinogen, UA: 1 mg/dL (ref 0.0–1.0)

## 2011-08-03 LAB — POCT I-STAT 4, (NA,K, GLUC, HGB,HCT)
Glucose, Bld: 103 mg/dL — ABNORMAL HIGH (ref 70–99)
Potassium: 4.1 mEq/L (ref 3.5–5.1)

## 2011-08-03 LAB — TYPE AND SCREEN

## 2011-09-27 ENCOUNTER — Ambulatory Visit: Payer: Medicare Other | Admitting: Physical Therapy

## 2011-10-01 ENCOUNTER — Ambulatory Visit: Payer: Medicare Other | Admitting: Physical Therapy

## 2011-10-03 ENCOUNTER — Ambulatory Visit: Payer: Medicare Other | Attending: Internal Medicine | Admitting: Physical Therapy

## 2011-10-03 DIAGNOSIS — IMO0001 Reserved for inherently not codable concepts without codable children: Secondary | ICD-10-CM | POA: Insufficient documentation

## 2011-10-03 DIAGNOSIS — R269 Unspecified abnormalities of gait and mobility: Secondary | ICD-10-CM | POA: Insufficient documentation

## 2011-10-08 ENCOUNTER — Encounter: Payer: Medicare Other | Admitting: Physical Therapy

## 2011-10-10 ENCOUNTER — Ambulatory Visit: Payer: Medicare Other | Admitting: Physical Therapy

## 2011-10-15 ENCOUNTER — Ambulatory Visit: Payer: Medicare Other | Admitting: Rehabilitative and Restorative Service Providers"

## 2011-10-17 ENCOUNTER — Ambulatory Visit: Payer: Medicare Other | Admitting: Rehabilitative and Restorative Service Providers"

## 2011-10-19 ENCOUNTER — Ambulatory Visit: Payer: Medicare Other | Admitting: Rehabilitative and Restorative Service Providers"

## 2011-10-24 ENCOUNTER — Ambulatory Visit: Payer: Medicare Other | Admitting: Physical Therapy

## 2011-10-26 ENCOUNTER — Ambulatory Visit: Payer: Medicare Other | Admitting: Physical Therapy

## 2011-10-29 ENCOUNTER — Encounter: Payer: Medicare Other | Admitting: Physical Therapy

## 2011-10-31 ENCOUNTER — Ambulatory Visit: Payer: Medicare Other | Attending: Internal Medicine | Admitting: Rehabilitative and Restorative Service Providers"

## 2011-10-31 DIAGNOSIS — R269 Unspecified abnormalities of gait and mobility: Secondary | ICD-10-CM | POA: Insufficient documentation

## 2011-10-31 DIAGNOSIS — IMO0001 Reserved for inherently not codable concepts without codable children: Secondary | ICD-10-CM | POA: Insufficient documentation

## 2011-11-02 ENCOUNTER — Ambulatory Visit: Payer: Medicare Other | Admitting: Rehabilitative and Restorative Service Providers"

## 2011-11-05 ENCOUNTER — Ambulatory Visit: Payer: Medicare Other | Admitting: Physical Therapy

## 2011-11-05 ENCOUNTER — Encounter: Payer: Medicare Other | Admitting: Rehabilitative and Restorative Service Providers"

## 2011-11-06 ENCOUNTER — Ambulatory Visit: Payer: Medicare Other | Admitting: Physical Therapy

## 2011-11-07 ENCOUNTER — Encounter: Payer: Medicare Other | Admitting: Rehabilitative and Restorative Service Providers"

## 2011-11-09 ENCOUNTER — Encounter: Payer: Medicare Other | Admitting: Rehabilitative and Restorative Service Providers"

## 2011-11-12 ENCOUNTER — Observation Stay (HOSPITAL_COMMUNITY): Payer: Medicare Other

## 2011-11-12 ENCOUNTER — Encounter (HOSPITAL_COMMUNITY): Payer: Self-pay | Admitting: Emergency Medicine

## 2011-11-12 ENCOUNTER — Emergency Department (HOSPITAL_COMMUNITY): Payer: Medicare Other

## 2011-11-12 ENCOUNTER — Inpatient Hospital Stay (HOSPITAL_COMMUNITY)
Admission: EM | Admit: 2011-11-12 | Discharge: 2011-11-15 | DRG: 184 | Disposition: A | Discharge status: Skilled Nursing Facility | Payer: Medicare Other | Source: Ambulatory Visit | Attending: Internal Medicine | Admitting: Internal Medicine

## 2011-11-12 DIAGNOSIS — W010XXA Fall on same level from slipping, tripping and stumbling without subsequent striking against object, initial encounter: Secondary | ICD-10-CM | POA: Diagnosis present

## 2011-11-12 DIAGNOSIS — S2239XA Fracture of one rib, unspecified side, initial encounter for closed fracture: Secondary | ICD-10-CM

## 2011-11-12 DIAGNOSIS — Z8546 Personal history of malignant neoplasm of prostate: Secondary | ICD-10-CM

## 2011-11-12 DIAGNOSIS — N390 Urinary tract infection, site not specified: Secondary | ICD-10-CM | POA: Diagnosis present

## 2011-11-12 DIAGNOSIS — K5900 Constipation, unspecified: Secondary | ICD-10-CM | POA: Diagnosis present

## 2011-11-12 DIAGNOSIS — I10 Essential (primary) hypertension: Secondary | ICD-10-CM | POA: Diagnosis present

## 2011-11-12 DIAGNOSIS — Z66 Do not resuscitate: Secondary | ICD-10-CM | POA: Diagnosis present

## 2011-11-12 DIAGNOSIS — E785 Hyperlipidemia, unspecified: Secondary | ICD-10-CM | POA: Diagnosis present

## 2011-11-12 DIAGNOSIS — F0390 Unspecified dementia without behavioral disturbance: Secondary | ICD-10-CM | POA: Diagnosis present

## 2011-11-12 DIAGNOSIS — F32A Depression, unspecified: Secondary | ICD-10-CM | POA: Diagnosis present

## 2011-11-12 DIAGNOSIS — J4489 Other specified chronic obstructive pulmonary disease: Secondary | ICD-10-CM | POA: Diagnosis present

## 2011-11-12 DIAGNOSIS — I2510 Atherosclerotic heart disease of native coronary artery without angina pectoris: Secondary | ICD-10-CM | POA: Diagnosis present

## 2011-11-12 DIAGNOSIS — Z880 Allergy status to penicillin: Secondary | ICD-10-CM

## 2011-11-12 DIAGNOSIS — M79609 Pain in unspecified limb: Secondary | ICD-10-CM | POA: Diagnosis present

## 2011-11-12 DIAGNOSIS — E860 Dehydration: Secondary | ICD-10-CM | POA: Diagnosis present

## 2011-11-12 DIAGNOSIS — N179 Acute kidney failure, unspecified: Secondary | ICD-10-CM | POA: Diagnosis present

## 2011-11-12 DIAGNOSIS — Z79899 Other long term (current) drug therapy: Secondary | ICD-10-CM

## 2011-11-12 DIAGNOSIS — F3289 Other specified depressive episodes: Secondary | ICD-10-CM | POA: Diagnosis present

## 2011-11-12 DIAGNOSIS — M8448XA Pathological fracture, other site, initial encounter for fracture: Secondary | ICD-10-CM | POA: Diagnosis present

## 2011-11-12 DIAGNOSIS — F329 Major depressive disorder, single episode, unspecified: Secondary | ICD-10-CM | POA: Diagnosis present

## 2011-11-12 DIAGNOSIS — E538 Deficiency of other specified B group vitamins: Secondary | ICD-10-CM

## 2011-11-12 DIAGNOSIS — Z885 Allergy status to narcotic agent status: Secondary | ICD-10-CM

## 2011-11-12 DIAGNOSIS — J449 Chronic obstructive pulmonary disease, unspecified: Secondary | ICD-10-CM | POA: Diagnosis present

## 2011-11-12 DIAGNOSIS — S2249XA Multiple fractures of ribs, unspecified side, initial encounter for closed fracture: Principal | ICD-10-CM | POA: Diagnosis present

## 2011-11-12 DIAGNOSIS — M109 Gout, unspecified: Secondary | ICD-10-CM

## 2011-11-12 HISTORY — DX: Atherosclerotic heart disease of native coronary artery without angina pectoris: I25.10

## 2011-11-12 LAB — CBC
HCT: 36.6 % — ABNORMAL LOW (ref 39.0–52.0)
MCV: 86.5 fL (ref 78.0–100.0)
RBC: 4.23 MIL/uL (ref 4.22–5.81)
RDW: 14 % (ref 11.5–15.5)
WBC: 9.5 10*3/uL (ref 4.0–10.5)

## 2011-11-12 LAB — URINALYSIS, ROUTINE W REFLEX MICROSCOPIC
Specific Gravity, Urine: 1.009 (ref 1.005–1.030)
Urobilinogen, UA: 1 mg/dL (ref 0.0–1.0)
pH: 6 (ref 5.0–8.0)

## 2011-11-12 LAB — BASIC METABOLIC PANEL
BUN: 25 mg/dL — ABNORMAL HIGH (ref 6–23)
Chloride: 103 mEq/L (ref 96–112)
Glucose, Bld: 102 mg/dL — ABNORMAL HIGH (ref 70–99)
Potassium: 4.7 mEq/L (ref 3.5–5.1)

## 2011-11-12 LAB — DIFFERENTIAL
Eosinophils Relative: 5 % (ref 0–5)
Lymphs Abs: 2.1 10*3/uL (ref 0.7–4.0)
Monocytes Relative: 8 % (ref 3–12)
Neutro Abs: 6.1 10*3/uL (ref 1.7–7.7)

## 2011-11-12 LAB — URINE MICROSCOPIC-ADD ON

## 2011-11-12 MED ORDER — MEMANTINE HCL 10 MG PO TABS
10.0000 mg | ORAL_TABLET | Freq: Two times a day (BID) | ORAL | Status: DC
  Administered 2011-11-12 – 2011-11-15 (×7): 10 mg via ORAL
  Filled 2011-11-12 (×8): qty 1

## 2011-11-12 MED ORDER — ACETAMINOPHEN 650 MG RE SUPP: 650.0000 mg | Freq: Four times a day (QID) | RECTAL | Status: DC | PRN

## 2011-11-12 MED ORDER — METOPROLOL TARTRATE 25 MG PO TABS: 25.0000 mg | ORAL_TABLET | Freq: Two times a day (BID) | ORAL | Status: DC

## 2011-11-12 MED ORDER — DOCUSATE SODIUM 100 MG PO CAPS
100.0000 mg | ORAL_CAPSULE | Freq: Two times a day (BID) | ORAL | Status: DC
  Administered 2011-11-12: 100 mg via ORAL
  Filled 2011-11-12 (×3): qty 1

## 2011-11-12 MED ORDER — ALBUTEROL SULFATE (5 MG/ML) 0.5% IN NEBU
2.5000 mg | INHALATION_SOLUTION | Freq: Four times a day (QID) | RESPIRATORY_TRACT | Status: DC
  Administered 2011-11-13 – 2011-11-15 (×10): 2.5 mg via RESPIRATORY_TRACT
  Filled 2011-11-12 (×11): qty 0.5

## 2011-11-12 MED ORDER — ONDANSETRON HCL 4 MG PO TABS: 4.0000 mg | ORAL_TABLET | Freq: Four times a day (QID) | ORAL | Status: DC | PRN

## 2011-11-12 MED ORDER — SODIUM CHLORIDE 0.9 % IV SOLN: INTRAVENOUS | Status: DC

## 2011-11-12 MED ORDER — DONEPEZIL HCL 10 MG PO TABS
10.0000 mg | ORAL_TABLET | Freq: Every day | ORAL | Status: DC
  Administered 2011-11-12 – 2011-11-14 (×3): 10 mg via ORAL
  Filled 2011-11-12 (×4): qty 1

## 2011-11-12 MED ORDER — ONDANSETRON HCL 4 MG/2ML IJ SOLN: 4.0000 mg | Freq: Four times a day (QID) | INTRAMUSCULAR | Status: DC | PRN

## 2011-11-12 MED ORDER — MORPHINE SULFATE 2 MG/ML IJ SOLN: 2.0000 mg | INTRAMUSCULAR | Status: DC | PRN

## 2011-11-12 MED ORDER — SODIUM CHLORIDE 0.9 % IV SOLN
INTRAVENOUS | Status: DC
  Administered 2011-11-13: 05:00:00 via INTRAVENOUS

## 2011-11-12 MED ORDER — TAMSULOSIN HCL 0.4 MG PO CAPS
0.4000 mg | ORAL_CAPSULE | Freq: Every day | ORAL | Status: DC
  Administered 2011-11-12 – 2011-11-14 (×3): 0.4 mg via ORAL
  Filled 2011-11-12 (×4): qty 1

## 2011-11-12 MED ORDER — ALBUTEROL SULFATE (5 MG/ML) 0.5% IN NEBU: 2.5000 mg | INHALATION_SOLUTION | RESPIRATORY_TRACT | Status: DC | PRN

## 2011-11-12 MED ORDER — CITALOPRAM HYDROBROMIDE 20 MG PO TABS
20.0000 mg | ORAL_TABLET | Freq: Every day | ORAL | Status: DC
  Administered 2011-11-12 – 2011-11-15 (×4): 20 mg via ORAL
  Filled 2011-11-12 (×4): qty 1

## 2011-11-12 MED ORDER — METOPROLOL TARTRATE 25 MG PO TABS
25.0000 mg | ORAL_TABLET | Freq: Two times a day (BID) | ORAL | Status: DC
  Administered 2011-11-12 – 2011-11-15 (×6): 25 mg via ORAL
  Filled 2011-11-12 (×7): qty 1

## 2011-11-12 MED ORDER — OXYCODONE HCL 5 MG PO TABS
5.0000 mg | ORAL_TABLET | ORAL | Status: DC | PRN
  Administered 2011-11-13 (×2): 5 mg via ORAL
  Filled 2011-11-12 (×2): qty 1

## 2011-11-12 MED ORDER — POLYETHYLENE GLYCOL 3350 17 G PO PACK
17.0000 g | PACK | Freq: Every day | ORAL | Status: DC | PRN
  Filled 2011-11-12: qty 1

## 2011-11-12 MED ORDER — MORPHINE SULFATE 2 MG/ML IJ SOLN
2.0000 mg | INTRAMUSCULAR | Status: DC | PRN
  Administered 2011-11-12 – 2011-11-13 (×2): 2 mg via INTRAVENOUS
  Filled 2011-11-12 (×2): qty 1

## 2011-11-12 MED ORDER — MORPHINE SULFATE 2 MG/ML IJ SOLN
2.0000 mg | Freq: Once | INTRAMUSCULAR | Status: AC
  Administered 2011-11-12: 2 mg via INTRAVENOUS
  Filled 2011-11-12: qty 1

## 2011-11-12 MED ORDER — OXYCODONE HCL 10 MG PO TB12
10.0000 mg | ORAL_TABLET | Freq: Two times a day (BID) | ORAL | Status: DC
  Administered 2011-11-12 – 2011-11-15 (×6): 10 mg via ORAL
  Filled 2011-11-12 (×6): qty 1

## 2011-11-12 MED ORDER — ACETAMINOPHEN 325 MG PO TABS: 650.0000 mg | ORAL_TABLET | Freq: Four times a day (QID) | ORAL | Status: DC | PRN

## 2011-11-12 NOTE — ED Notes (Signed)
Per daughter pt has been in er x 3 in last 2 weeks for falling has help 8 hours durning the day but pt falls at night in br.  Daughter states that pt is hard headed

## 2011-11-12 NOTE — Progress Notes (Signed)
Clinical Social Work is aware of consult and need for ? Placement.  Patient lives alone in three bedroom house, alert and oriented to place, self, time and agreeable to placement.  [facility] and agreeable to return.  Patient at the current time has a Engineer, site with him from [facility] 11-7 and participates in outpatient therapy through [facility] 3x a week.    Patient reports he was doing well at home, sitter provides meals, and has adequate transportation.  Patient reports he recently had his floors (tile) redone in his home which has made it increasingly slippery.  Reports he was not falling prior to floors.  Reports he was getting ready for bed, slipped, hit back on sink area, took some time to get up but managed to put himself to bed.  Reports he called EMS this am to come to the hospital to get checked out.  Reports he called daughter who is also at bedside.  Both agreeable for skilled nursing home.    Reviewed with MD with regards to plan of care and placement options.  Reports will be admitting patient and CSW will work patient up for nursing home placement.  MD please order CSW consult and also PT/OT to treat and evaluate  Will continue to follow patient and update service line CSW. Please call with questions or concerns.   Ashley Jacobs, MSW LCSW [phone]

## 2011-11-12 NOTE — Progress Notes (Signed)
Met with patient and his daughter, together with SW Dahlia Client. Discussed current living situation (independent, 1-story home, private caregiver Mon-Sat 11a-7p, outpatient PT). Patient states he has fallen recently because his floor were redone and they are very slippery. Patient does have a life-alert system in the home. However, fall occurred last night just prior to bedtime, he got himself up and went to bed; then called 911 this morning, not wanting to disturb his daughter last night. Patient is agreeable to intensified home services (RN, PT, OT, etc.) as well as short term placement for rehabilitative purposes. Patient will probably be admitted due to rib and lumbar injuries. Will need follow-up throughout inpatient stay.

## 2011-11-12 NOTE — ED Notes (Addendum)
Pt undressed and in gown. Bp cuff and pulse ox on. 

## 2011-11-12 NOTE — H&P (Signed)
PCP:  Pearla Dubonnet, MD  Chief Complaint:  Right sided chest pain  HPI: Patient is an 78 year old white male past medical history of hypertension, very mild dementia and COPD who had had his floors recently done and fell when he walked on a slippery area landing on his right side very hard. He noted on his right arm and chest. Initially he was sore and that something fell asleep and we will as morning he was even more pain on his right side it hard for him to take a deep breath or even move without causing severe pain. He came to the emergency room, he was evaluated and x-rays noted multiple rib fractures on the right side as well as a L1 compression fracture, although it was unclear if this is acute. Patient is having no lower back pain. He also to be minimally dehydrated but everything is ultimately stable. Patient is actually quite alert and oriented and it was felt best given the fact that he lives alone, but he come in for further treatment of his pain and see if he needed home health versus short-term skilled nursing to be able to cannulate better. [facility] hospitals were called for admission.  Review of Systems:  When I saw the patient, he was complaining of again right-sided pain mostly slightly lower than the axillary area. Worse when takes deep inspiration and worse with palpation. He denies any left-sided chest pain. He denies any shortness of breath. Does complain of a cough which has take a deep breath which irritates him and causes more pain. Denies any headaches or vision changes or dysphagia. No palpitations or wheezing. Abdominal pain or hematuria, dysuria, constipation, diarrhea, focal extremity numbness weakness or pain other than described above. During examination I move the patient's right arm which actually caused him some increased pain he said. It was mostly in the right arm although there is some pain in the shoulder. X-rays have been ordered and have not yet been done. Review  systems otherwise negative  Past Medical History: Past Medical History  Diagnosis Date  . Coronary artery disease    No past surgical history on file.  Medications: Prior to Admission medications   Medication Sig Start Date End Date Taking? Authorizing Provider  citalopram (CELEXA) 20 MG tablet Take 20 mg by mouth daily.   Yes Historical Provider, MD  donepezil (ARICEPT) 10 MG tablet Take 10 mg by mouth at bedtime.   Yes Historical Provider, MD  memantine (NAMENDA) 10 MG tablet Take 10 mg by mouth 2 (two) times daily.   Yes Historical Provider, MD  metoprolol (LOPRESSOR) 50 MG tablet Take 25 mg by mouth 2 (two) times daily.   Yes Historical Provider, MD  Tamsulosin HCl (FLOMAX) 0.4 MG CAPS Take 0.4 mg by mouth at bedtime.   Yes Historical Provider, MD  torsemide (DEMADEX) 20 MG tablet Take 20 mg by mouth See admin instructions. Take 1 tablet on Monday, Wednesday, and Friday   Yes Historical Provider, MD    Allergies:   Allergies  Allergen Reactions  . Penicillins     REACTION: rash  . Percocet (Oxycodone-Acetaminophen) Other (See Comments)    delusional    Social History:  does not have a smoking history on file. He does not have any smokeless tobacco history on file. His alcohol and drug histories not on file. The patient is normally at baseline able to participate in full activities of daily living without any assistance. He lives at home alone and his  daughter lives nearby.  Family History: Hypertension  Physical Exam: Filed Vitals:   11/12/11 1120 11/12/11 1203 11/12/11 1231 11/12/11 1245  BP: 127/62 133/65 121/54 104/51  Pulse: 79 81 91 91  Temp:      TempSrc:      Resp: 16 18 18    SpO2: 95% 95% 95% 93%   General: Alert and oriented x3, mild distress when he tries to move or take a deep breath causing some pain on his right side mostly in his chest some in his arm. HEENT: Normocephalic, atraumatic, mucous membranes are slightly dry Cardiovascular: Regular rate  and rhythm, S1-S2, 2/6 systolic ejection murmur Lungs: Decreased breath sounds throughout, poor inspiratory effort secondary to pain Abdomen: Soft, nontender, nondistended, positive bowel sounds extremities: No clubbing or cyanosis, trace pitting edema. I attempted to raise and abduct his right arm which cause some pain more so in the right arm than the shoulder. There was no limited abduction   Labs on Admission:   [facility] 11/12/11 1101  NA 141  K 4.7  CL 103  CO2 28  GLUCOSE 102*  BUN 25*  CREATININE 1.46*  CALCIUM 9.7  MG --  PHOS --    Basename 11/12/11 1101  WBC 9.5  NEUTROABS 6.1  HGB 12.1*  HCT 36.6*  MCV 86.5  PLT 171    Radiological Exams on Admission:  Dg Ribs Unilateral W/chest Right 11/12/2011   IMPRESSION:  1.  Acute fractures of the lateral right seventh through tenth ribs.  No pneumothorax. 2.  L1 vertebral compression fracture.  This appears mildly increased when compared to the previous portable abdomen dated 07/18/2010.  However, this change may be projectional.  Dg Pelvis 1-2 Views 11/12/2011    IMPRESSION: No acute bony abnormality.   Assessment/Plan Present on Admission:  .HYPERLIPIDEMIA: Stable. Patient not on statin medication.  Marland KitchenHYPERTENSION: Stable. Restart medications, watch for low blood pressure.  .Dementia: Continue Aricept and Namenda. Stable.  Marland KitchenCOPD (chronic obstructive pulmonary disease): Patient is supposed to be on albuterol nebulizers 4 times a day, but according to his daughter he is not been taking this. We'll restart here. Watch his oxygen saturations.  .Fall from slipping on slippery surface: Looks to be a mechanical fall. See below.  .ARF (acute renal failure): 1-1/2 years ago ago, renal function normal. 6 months ago, creatinine slightly increased at 1.3. We'll gently hydrate and recheck labs in the morning.  .Depression: Stable. Continue Celexa.  .Multiple rib fractures: Principal problem. Pain control with every 12 hour  OxyContin extended release plus when necessary medication for breakthrough. PT and OT to see. Inspirometer ordered. I spoken with trauma surgery to update them as patient with rib fractures need to be on the registry. We'll see if patient can go home with home health or need short-term skilled nursing.  Right arm pain: We'll check x-rays to make sure there is no occult fracture.  After discussion with the patient, he is to be a DO NOT RESUSCITATE, confirmed by his daughter.  We will respect these wishes.  I anticipate his length of stay to be 1-2 based on how he progresses and if he needs short-term skilled nursing.  Time spent on this patient including examination and decision-making process: 40 minutes.  Hollice Espy [phone] 11/12/2011, 1:18 PM

## 2011-11-12 NOTE — ED Provider Notes (Signed)
History     CSN: [id number]  Arrival date & time 11/12/11  0944   First MD Initiated Contact with Patient 11/12/11 [phone]      Chief Complaint  Patient presents with  . Fall    (Consider location/radiation/quality/duration/timing/severity/associated sxs/prior treatment) HPI  Patient presents to the emergency room after a fall last night. Patient states she was walking and slipped and fell on the corner of the sink. He scraped and injured his back. Patient has been able to walk but when he woke up this morning the pain was more severe. He denies any vomiting or diarrhea. He has not noticed any blood in his stool. He denies any shortness of breath. Patient does not have any pain in his hips. Patient denies any loss of consciousness. He has not had any confusion. Movement and walking increases the pain. The pain is sharp and severe when he moves.  Past Medical History  Diagnosis Date  . Coronary artery disease     No past surgical history on file.  No family history on file.  History  Substance Use Topics  . Smoking status: Not on file  . Smokeless tobacco: Not on file  . Alcohol Use:       Review of Systems  All other systems reviewed and are negative.    Allergies  Penicillins and Percocet  Home Medications   Current Outpatient Rx  Name Route Sig Dispense Refill  . CITALOPRAM HYDROBROMIDE 20 MG PO TABS Oral Take 20 mg by mouth daily.    . DONEPEZIL HCL 10 MG PO TABS Oral Take 10 mg by mouth at bedtime.    Marland Kitchen MEMANTINE HCL 10 MG PO TABS Oral Take 10 mg by mouth 2 (two) times daily.    Marland Kitchen METOPROLOL TARTRATE 50 MG PO TABS Oral Take 25 mg by mouth 2 (two) times daily.    Marland Kitchen TAMSULOSIN HCL 0.4 MG PO CAPS Oral Take 0.4 mg by mouth at bedtime.    . TORSEMIDE 20 MG PO TABS Oral Take 20 mg by mouth daily. Take 1 tablet on Monday, Wednesday, and Friday      BP 123/55  Pulse 73  Temp(Src) 97.6 F (36.4 C) (Oral)  Resp 16  SpO2 95%  Physical Exam  Nursing note and  vitals reviewed. Constitutional: He appears well-developed and well-nourished. No distress.  HENT:  Head: Normocephalic and atraumatic.  Right Ear: External ear normal.  Left Ear: External ear normal.  Eyes: Conjunctivae are normal. Right eye exhibits no discharge. Left eye exhibits no discharge. No scleral icterus.  Neck: Neck supple. No tracheal deviation present.  Cardiovascular: Normal rate, regular rhythm and intact distal pulses.   Pulmonary/Chest: Effort normal and breath sounds normal. No stridor. No respiratory distress. He has no wheezes. He has no rales.   He exhibits tenderness.  Abdominal: Soft. Bowel sounds are normal. He exhibits no distension. There is no tenderness. There is no rebound and no guarding.  Musculoskeletal: He exhibits no edema and no tenderness.       Right hip: He exhibits normal range of motion and no tenderness.       Left hip: He exhibits normal range of motion and no tenderness.       Cervical back: He exhibits no tenderness, no bony tenderness and no swelling.       Thoracic back: He exhibits no tenderness, no bony tenderness and no swelling.       Lumbar back: He exhibits no tenderness, no bony tenderness  and no swelling.  Neurological: He is alert. He has normal strength. No sensory deficit. Cranial nerve deficit:  no gross defecits noted. He exhibits normal muscle tone. He displays no seizure activity. Coordination normal.  Skin: Skin is warm and dry. No rash noted.  Psychiatric: He has a normal mood and affect.    ED Course  Procedures (including critical care time)  Labs Reviewed  CBC - Abnormal; Notable for the following:    Hemoglobin 12.1 (*)    HCT 36.6 (*)    All other components within normal limits  BASIC METABOLIC PANEL - Abnormal; Notable for the following:    Glucose, Bld 102 (*)    BUN 25 (*)    Creatinine, Ser 1.46 (*)    GFR calc non Af Amer 42 (*)    GFR calc Af Amer 49 (*)    All other components within normal limits    DIFFERENTIAL  URINALYSIS, ROUTINE W REFLEX MICROSCOPIC   Dg Ribs Unilateral W/chest Right  11/12/2011  *RADIOLOGY REPORT*  Clinical Data: Recent fall.  Right rib pain.  RIGHT RIBS AND CHEST - 3+ VIEW  Comparison: 01/04/2012chest x-ray and 09/20/2011portable abdomen.  Findings: There are fractures of the lateral right seventh through tenth ribs.  There is no pneumothorax or hemothorax. There is a compression fracture of the L1 vertebral body. This appears mildly increased when compared to a 07/18/2010 portable abdomen view. There are mildly accentuated bronchial markings.  There has been a previous median sternotomy.  The heart is normal in size.  There is an aorto - iliac stent present.  IMPRESSION:  1.  Acute fractures of the lateral right seventh through tenth ribs.  No pneumothorax. 2.  L1 vertebral compression fracture.  This appears mildly increased when compared to the previous portable abdomen dated 07/18/2010.  However, this change may be projectional.  Original Report Authenticated By: Rolla Plate, M.D.   Dg Pelvis 1-2 Views  11/12/2011  *RADIOLOGY REPORT*  Clinical Data: Fall, right pelvic pain.  PELVIS - 1-2 VIEW  Comparison: None  Findings: Changes of prior aortic stent graft repair.  Surgical clips throughout the pelvis.  Mild degenerative changes in the hip joints bilaterally. No acute bony abnormality.  Specifically, no fracture, subluxation, or dislocation.  Soft tissues are intact.  IMPRESSION: No acute bony abnormality.  Original Report Authenticated By: Cyndie Chime, M.D.     1. Rib fractures       MDM  Patient appears to have had a mechanical fall. There does not appear to be significant injury. X-rays have been ordered.   10:59 AM the patient's daughter arrived to the emergency room. She is concerned because he has had several falls over the last couple of weeks. He has home health care 8 hours of the day but he has been falling during these other 16 hours.  Family  requests social worker consult as they would like to get him placed in some sort of a nursing type facility  Pt with rib fractures following his fall.  Will admit for pain management possible physical therapy.  Findings discussed with pt.  Social work has been here to talk to patient.        Celene Kras, MD 11/12/11 1235

## 2011-11-12 NOTE — ED Notes (Signed)
Patient ate warm food tray without incident. Ax4 family at bedside watching TV.

## 2011-11-12 NOTE — ED Notes (Signed)
Pain currently 2/10 achy at rest pain increases to 7-8/10 achy sharp with movement.

## 2011-11-12 NOTE — ED Notes (Signed)
Report given to Gildardo Cranker, RN on 5000. Patient being transferred to 5005.

## 2011-11-12 NOTE — ED Notes (Signed)
Pt eating lunch. Daughter at bedside.

## 2011-11-12 NOTE — ED Notes (Signed)
Pt aware that we need urine sample. Urinal at bedside. 

## 2011-11-12 NOTE — ED Notes (Signed)
First meeting with patient. Patient resting with NAD at this time. Family at bedside. Patient remains on monitor and oxygen with saturation at 98%.

## 2011-11-13 DIAGNOSIS — M109 Gout, unspecified: Secondary | ICD-10-CM

## 2011-11-13 DIAGNOSIS — E538 Deficiency of other specified B group vitamins: Secondary | ICD-10-CM

## 2011-11-13 LAB — BASIC METABOLIC PANEL
CO2: 28 mEq/L (ref 19–32)
Calcium: 8.9 mg/dL (ref 8.4–10.5)
GFR calc Af Amer: 67 mL/min — ABNORMAL LOW (ref >90)
GFR calc non Af Amer: 58 mL/min — ABNORMAL LOW (ref >90)
Sodium: 141 mEq/L (ref 135–145)

## 2011-11-13 LAB — CBC
MCV: 86.9 fL (ref 78.0–100.0)
Platelets: ADEQUATE 10*3/uL (ref 150–400)
RBC: 4.06 MIL/uL — ABNORMAL LOW (ref 4.22–5.81)
WBC: 8.5 10*3/uL (ref 4.0–10.5)

## 2011-11-13 MED ORDER — POLYETHYLENE GLYCOL 3350 17 G PO PACK
17.0000 g | PACK | Freq: Every day | ORAL | Status: DC
  Administered 2011-11-14 – 2011-11-15 (×2): 17 g via ORAL
  Filled 2011-11-13 (×3): qty 1

## 2011-11-13 MED ORDER — SENNOSIDES-DOCUSATE SODIUM 8.6-50 MG PO TABS
1.0000 | ORAL_TABLET | Freq: Two times a day (BID) | ORAL | Status: DC
  Administered 2011-11-13 – 2011-11-15 (×4): 1 via ORAL
  Filled 2011-11-13 (×3): qty 1

## 2011-11-13 NOTE — Progress Notes (Signed)
Attempted to see x 2.  Pt. In too much pain after PT.  Will reattempt again this pm as able.  Jeani Hawking, OTR/L [phone]

## 2011-11-13 NOTE — Progress Notes (Signed)
OT eval completed with full report to follow.  Recommend SNF level rehab at D/C.  Jeani Hawking, OTR/L [phone]

## 2011-11-13 NOTE — Progress Notes (Signed)
Physical Therapy Evaluation Patient Details Name: Logan Allen MRN: [id number] DOB: Jul 10, 1924 Today's Date: 11/13/2011  Problem List:  Patient Active Problem List  Diagnoses  . HYPERLIPIDEMIA  . HYPERTENSION  . ANGINA PECTORIS  . DYSPNEA  . COUGH  . HEART MURMUR, HX OF  . Dementia  . COPD (chronic obstructive pulmonary disease)  . Fall from slipping on slippery surface  . ARF (acute renal failure)  . Depression  . Multiple rib fractures    Past Medical History:  Past Medical History  Diagnosis Date  . Coronary artery disease    Past Surgical History: No past surgical history on file.  PT Assessment/Plan/Recommendation PT Assessment Clinical Impression Statement: 78yo male presents with lateral R rib fractures and L1 compression fx s/p fall in bathroom. PT Recommendation/Assessment: Patient will need skilled PT in the acute care venue PT Problem List: Decreased strength;Decreased activity tolerance;Decreased mobility;Decreased knowledge of use of DME;Pain Problem List Comments: Pt has generalized muscle weakness, pain with mobility, difficulty breathing, decreased RUE weight bearing and decreaesd functional mobility Barriers to Discharge: Decreased caregiver support Barriers to Discharge Comments: Lives alone, although per chart daughter lives close by. PT Therapy Diagnosis : Difficulty walking;Generalized weakness;Acute pain PT Plan PT Treatment/Interventions: DME instruction;Gait training;Functional mobility training;Therapeutic activities;Therapeutic exercise;Balance training;Patient/family education PT Recommendation Follow Up Recommendations: Home health PT;Supervision/Assistance - 24 hour Equipment Recommended: Rolling walker with 5" wheels;Other (comment) (per patient has RW for use at d/c) PT Goals  Acute Rehab PT Goals PT Goal Formulation: With patient Time For Goal Achievement: 7 days Pt will go Supine/Side to Sit: with modified independence Pt will go Sit to  Stand: with modified independence Pt will go Stand to Sit: with modified independence Pt will Transfer Bed to Chair/Chair to Bed: with modified independence Pt will Ambulate: 51 - 150 feet;with rolling walker;with supervision Pt will Perform Home Exercise Program: with supervision, verbal cues required/provided  PT Evaluation Precautions/Restrictions  Precautions Precautions: Fall Precaution Comments: Uses RW at home PTA; fell on right side, weight bearing on RUE painful Required Braces or Orthoses: No Restrictions Weight Bearing Restrictions: No Pain: No pain supine in bed; pain with increased mobility (5-6 per pt); RN notified Prior Functioning  Home Living Lives With: Alone Type of Home: House Home Layout: One level Home Access: Level entry Home Adaptive Equipment: Straight cane;Walker - rolling Prior Function Level of Independence: Independent with basic ADLs;Requires assistive device for independence;Independent with transfers Cognition Cognition Arousal/Alertness: Awake/alert Overall Cognitive Status: Appears within functional limits for tasks assessed Cognition - Other Comments: Oriented to self, place and situation.  At first, unsure of time, but corrected himself. Decreased safety awareness which may require supervision at home. Sensation/Coordination Sensation Light Touch: Appears Intact Stereognosis: Not tested Hot/Cold: Not tested Proprioception: Not tested Coordination Gross Motor Movements are Fluid and Coordinated: Yes Fine Motor Movements are Fluid and Coordinated: Yes Extremity Assessment RLE Assessment RLE Assessment: Within Functional Limits LLE Assessment LLE Assessment: Within Functional Limits Mobility (including Balance) Bed Mobility Bed Mobility: Yes Supine to Sit: 3: Mod assist Supine to Sit Details (indicate cue type and reason): (A) to LUE to help sit-up secondary to pain. Sitting - Scoot to Edge of Bed: 4: Min assist Sit to Supine: 3: Mod  assist Sit to Supine - Details (indicate cue type and reason): (A) to bilat LE secondary to pain. Transfers Transfers: Yes Sit to Stand: 3: Mod assist Stand to Sit: 4: Min assist Ambulation/Gait Ambulation/Gait: Yes Ambulation/Gait Assistance: 4: Min assist Ambulation Distance (Feet): 60 Feet  Assistive device: Rolling walker Gait Pattern: Step-through pattern;Decreased step length - right;Decreased step length - left;Decreased stride length;Trunk flexed (kyphotic; Walks behind and R lateral of walker) Gait velocity: slow Stairs: No Wheelchair Mobility Wheelchair Mobility: No  Posture/Postural Control Posture/Postural Control: No significant limitations Balance Balance Assessed: Yes Static Standing Balance Static Standing -  Initially, posterior and R lateral lean; pt able to self correct. End of Session PT - End of Session Activity Tolerance: Patient limited by pain;Patient limited by fatigue Patient left: in bed;with call bell in reach;with bed alarm set General Behavior During Session: [facility] for tasks performed Cognition: [facility] for tasks performed  Christianne Dolin 11/13/2011, 9:11 AM

## 2011-11-13 NOTE — Progress Notes (Signed)
Subjective: Patient is feeling slightly better today. He did ambulate with assistance/physical therapy. Wants to continue on IV morphine sulfate but will switch over to oral therapy only. Has not had a bowel movement today. No precordial chest throat or arm pain. Denies shortness of breath  Objective: Weight change:   Intake/Output Summary (Last 24 hours) at 11/13/11 1337 Last data filed at 11/13/11 0654  Gross per 24 hour  Intake    900 ml  Output    300 ml  Net    600 ml   Filed Vitals:   11/12/11 2159 11/12/11 2320 11/13/11 0555 11/13/11 0934  BP: 147/70 167/67 144/72   Pulse: 83 91 70   Temp: 97.7 F (36.5 C)  97.9 F (36.6 C)   TempSrc:      Resp: 20  20   SpO2: 92%  90% 94%    General: Alert and oriented x3, mild distress when he tries to move or take a deep breath causing some pain on his right side mostly in his chest some in his arm.  HEENT: Normocephalic, atraumatic, mucous membranes are slightly dry  Cardiovascular: Regular rate and rhythm, S1-S2, 2/6 systolic ejection murmur  Lungs: Decreased breath sounds throughout, poor inspiratory effort secondary to pain  Abdomen: Soft, nontender, nondistended, positive bowel sounds extremities: No clubbing or cyanosis, trace pitting edema. I attempted to raise and abduct his right arm which cause some pain more so in the right arm than the shoulder. There was no limited abduction  Lab Results:  Basename 11/13/11 0621 11/12/11 1101  NA 141 141  K 3.9 4.7  CL 104 103  CO2 28 28  GLUCOSE 114* 102*  BUN 20 25*  CREATININE 1.12 1.46*  CALCIUM 8.9 9.7  MG -- --  PHOS -- --   No results found for this basename: AST:2,ALT:2,ALKPHOS:2,BILITOT:2,PROT:2,ALBUMIN:2 in the last 72 hours No results found for this basename: LIPASE:2,AMYLASE:2 in the last 72 hours  Basename 11/13/11 0621 11/12/11 1101  WBC 8.5 9.5  NEUTROABS -- 6.1  HGB 11.6* 12.1*  HCT 35.3* 36.6*  MCV 86.9 86.5  PLT PLATELET CLUMPS NOTED ON SMEAR, COUNT  APPEARS ADEQUATE 171   No results found for this basename: CKTOTAL:3,CKMB:3,CKMBINDEX:3,TROPONINI:3 in the last 72 hours No components found with this basename: POCBNP:3 No results found for this basename: DDIMER:2 in the last 72 hours No results found for this basename: HGBA1C:2 in the last 72 hours No results found for this basename: CHOL:2,HDL:2,LDLCALC:2,TRIG:2,CHOLHDL:2,LDLDIRECT:2 in the last 72 hours No results found for this basename: TSH,T4TOTAL,FREET3,T3FREE,THYROIDAB in the last 72 hours No results found for this basename: VITAMINB12:2,FOLATE:2,FERRITIN:2,TIBC:2,IRON:2,RETICCTPCT:2 in the last 72 hours  Studies/Results: Dg Ribs Unilateral W/chest Right  11/12/2011  *RADIOLOGY REPORT*  Clinical Data: Recent fall.  Right rib pain.  RIGHT RIBS AND CHEST - 3+ VIEW  Comparison: 01/04/2012chest x-ray and 09/20/2011portable abdomen.  Findings: There are fractures of the lateral right seventh through tenth ribs.  There is no pneumothorax or hemothorax. There is a compression fracture of the L1 vertebral body. This appears mildly increased when compared to a 07/18/2010 portable abdomen view. There are mildly accentuated bronchial markings.  There has been a previous median sternotomy.  The heart is normal in size.  There is an aorto - iliac stent present.  IMPRESSION:  1.  Acute fractures of the lateral right seventh through tenth ribs.  No pneumothorax. 2.  L1 vertebral compression fracture.  This appears mildly increased when compared to the previous portable abdomen dated 07/18/2010.  However,  this change may be projectional.  Original Report Authenticated By: Rolla Plate, M.D.   Dg Pelvis 1-2 Views  11/12/2011  *RADIOLOGY REPORT*  Clinical Data: Fall, right pelvic pain.  PELVIS - 1-2 VIEW  Comparison: None  Findings: Changes of prior aortic stent graft repair.  Surgical clips throughout the pelvis.  Mild degenerative changes in the hip joints bilaterally. No acute bony abnormality.   Specifically, no fracture, subluxation, or dislocation.  Soft tissues are intact.  IMPRESSION: No acute bony abnormality.  Original Report Authenticated By: Cyndie Chime, M.D.   Dg Shoulder Right  11/12/2011  *RADIOLOGY REPORT*  Clinical Data: Right shoulder and arm pain post fall  RIGHT SHOULDER - 2+ VIEW  Comparison: 11/11/2008  Findings: Osseous demineralization. Deformity distal right clavicle post old healed fracture. AC joint alignment normal. No acute fracture, dislocation or bone destruction. Visualized right ribs intact.  IMPRESSION: Old healed distal right clavicular fracture. No acute bony abnormalities.  Original Report Authenticated By: Lollie Marrow, M.D.   Dg Humerus Right  11/12/2011  *RADIOLOGY REPORT*  Clinical Data: Right shoulder and arm pain post fall  RIGHT HUMERUS - 2+ VIEW  Comparison: None  Findings: Deformity distal right clavicle post old healed fracture. Osseous demineralization. No glenohumeral fracture or dislocation identified.  IMPRESSION: No acute right humeral abnormalities.  Original Report Authenticated By: Lollie Marrow, M.D.   Medications: Scheduled Meds:   . albuterol  2.5 mg Nebulization QID  . citalopram  20 mg Oral Daily  . docusate sodium  100 mg Oral BID  . donepezil  10 mg Oral QHS  . memantine  10 mg Oral BID  . metoprolol  25 mg Oral BID  . oxyCODONE  10 mg Oral Q12H  . Tamsulosin HCl  0.4 mg Oral QHS   Continuous Infusions:   . sodium chloride 75 mL/hr at 11/13/11 0654  . DISCONTD: sodium chloride     PRN Meds:.acetaminophen, acetaminophen, albuterol, morphine, ondansetron (ZOFRAN) IV, ondansetron, oxyCODONE, polyethylene glycol, DISCONTD:  morphine injection  Assessment/Plan: Patient Active Problem List  Diagnoses Date Noted  . Dementia - mild to moderate  11/12/2011  . COPD (chronic obstructive pulmonary disease) - symptomatically stable  11/12/2011  . Fall from slipping on slippery surface 11/12/2011  . ARF (acute renal failure)  11/12/2011  . Depression - stable  11/12/2011  . Multiple rib fractures - switched to by mouth medications and plans for transfer to skilled nursing facility for rehabilitation as soon as possible  11/12/2011  . COUGH 04/12/2008  . HYPERLIPIDEMIA 03/19/2008  . HYPERTENSION - controlled  03/19/2008  . ANGINA PECTORIS - asymptomatic  03/19/2008  . DYSPNEA 03/19/2008  . HEART MURMUR, HX OF Prostate cancer Disposition - hopefully to rehabilitation tomorrow  Constipation - MiraLAX and Senokot therapy  03/19/2008     LOS: 1 day   Alayha Babineaux NEVILL 11/13/2011, 1:37 PM

## 2011-11-14 ENCOUNTER — Encounter: Payer: Medicare Other | Admitting: Rehabilitative and Restorative Service Providers"

## 2011-11-14 DIAGNOSIS — N390 Urinary tract infection, site not specified: Secondary | ICD-10-CM | POA: Diagnosis present

## 2011-11-14 LAB — URINE CULTURE

## 2011-11-14 LAB — URIC ACID: Uric Acid, Serum: 3.6 mg/dL — ABNORMAL LOW (ref 4.0–7.8)

## 2011-11-14 LAB — VITAMIN B12: Vitamin B-12: 725 pg/mL (ref 211–911)

## 2011-11-14 MED ORDER — POLYETHYLENE GLYCOL 3350 17 G PO PACK: 17.0000 g | PACK | Freq: Every day | ORAL | Status: AC | PRN

## 2011-11-14 MED ORDER — SENNOSIDES-DOCUSATE SODIUM 8.6-50 MG PO TABS: 1.0000 | ORAL_TABLET | Freq: Two times a day (BID) | ORAL | Status: DC

## 2011-11-14 MED ORDER — ACETAMINOPHEN 325 MG PO TABS: 650.0000 mg | ORAL_TABLET | Freq: Four times a day (QID) | ORAL | Status: DC | PRN

## 2011-11-14 MED ORDER — SULFAMETHOXAZOLE-TMP DS 800-160 MG PO TABS: 1.0000 | ORAL_TABLET | Freq: Two times a day (BID) | ORAL | Status: AC

## 2011-11-14 NOTE — Progress Notes (Signed)
Clinical Social Work-CSW received word From SNF they will not have bed available until tomorrow. CSW has left msg for MD and will contact pt dtr. Jodean Lima, [phone]

## 2011-11-14 NOTE — Discharge Summary (Signed)
Physician Discharge Summary  NAME:Logan Allen  ZOX:[id number]  DOB: April 04, 1924   Admit date: 11/12/2011 Discharge date: 11/14/2011  Discharge Diagnoses:  Principal Problem:  *Multiple rib fractures - right ribs 7 through 10 fracture secondary to fall. No syncope. Patient slipped in the bathroom Active Problems: UTI - urinalysis consistent with UTI. Will send culture. Currently asymptomatic. Will need to be followed up as an outpatient. Will start Bactrim double strength twice a day for 10 days.  HYPERLIPIDEMIA  HYPERTENSION  Dementia  COPD (chronic obstructive pulmonary disease)  Fall from slipping on slippery surface  ARF (acute renal failure)  Depression  B12 deficiency  Gout   Discharge Condition: Improved but still having right chest pain  Hospital Course: Logan Allen is a pleasant 78 year old male who lives alone at home. He has been having more trouble as of late remaining independent at home. He was walking to the bathroom on the day of admission and slipped and fell and hit his right chest apparently on the sink and has suffered for right rib fractures. He is not stable on his feet as of yet. We'll certainly need skilled nursing facility physical therapy and occupational therapy but may be able to transfer to assisted living ultimately. He does have a urinalysis consistent with infection. We'll send urine culture and start Bactrim double strength twice a day for 10 days  Consults: Physical therapy and occupational therapy  Disposition: To skilled nursing facility for physical therapy and occupational therapy  Discharge Orders    Future Appointments: Provider: Department: Dept Phone: Center:   11/14/2011 1:45 PM Margretta Ditty, PT [facility] [phone] [facility]   11/16/2011 1:00 PM Margretta Ditty, PT [facility] [phone] [facility]   11/21/2011 1:00 PM Kerry Fort, PT [facility] [phone] [facility]   11/26/2011 1:45 PM Kerry Fort, PT [facility] [phone]  [facility]   11/29/2011 2:30 PM Kerry Fort, PT [facility] [phone] [facility]     Future Orders Please Complete By Expires   Diet - low sodium heart healthy      Increase activity slowly      Discharge instructions      Comments:   Continue incentive spirometry to help prevent pneumonia with rib fractures. Patient will need PT OT     Current Discharge Medication List    START taking these medications   Details  acetaminophen (TYLENOL) 325 MG tablet Take 2 tablets (650 mg total) by mouth every 6 (six) hours as needed (or Fever >/= 101). Qty: 60 tablet, Refills: 1    polyethylene glycol (MIRALAX / GLYCOLAX) packet Take 17 g by mouth daily as needed. Qty: 14 each, Refills: 1    senna-docusate (SENOKOT-S) 8.6-50 MG per tablet Take 1 tablet by mouth 2 (two) times daily. Qty: 60 tablet, Refills: 1      CONTINUE these medications which have NOT CHANGED   Details  albuterol (PROVENTIL) (2.5 MG/3ML) 0.083% nebulizer solution Take 2.5 mg by nebulization every 4 (four) hours as needed. For shortness of breath    allopurinol (ZYLOPRIM) 100 MG tablet Take 100 mg by mouth 2 (two) times daily.    bicalutamide (CASODEX) 50 MG tablet Take 50 mg by mouth daily.    cilostazol (PLETAL) 50 MG tablet Take 50 mg by mouth 2 (two) times daily.    citalopram (CELEXA) 20 MG tablet Take 20 mg by mouth daily.    donepezil (ARICEPT) 10 MG tablet Take 10 mg by mouth at bedtime.    DULoxetine (CYMBALTA) 30 MG capsule  Take 30 mg by mouth 2 (two) times daily.    ezetimibe (ZETIA) 10 MG tablet Take 10 mg by mouth daily.    memantine (NAMENDA) 10 MG tablet Take 10 mg by mouth 2 (two) times daily.    metoprolol (LOPRESSOR) 50 MG tablet Take 25 mg by mouth 2 (two) times daily.    potassium chloride SA (K-DUR,KLOR-CON) 20 MEQ tablet Take 20 mEq by mouth daily.    Tamsulosin HCl (FLOMAX) 0.4 MG CAPS Take 0.4 mg by mouth at bedtime.    torsemide (DEMADEX) 20 MG tablet Take 20 mg by mouth See admin  instructions. Take 1 tablet on Monday, Wednesday, and Friday    traMADol-acetaminophen (ULTRACET) 37.5-325 MG per tablet Take 1 tablet by mouth every 6 (six) hours as needed. For pain      STOP taking these medications     naproxen sodium (ANAPROX) 220 MG tablet        Follow-up Information    Follow up with Willetta York NEVILL, MD .         Things to follow up in the outpatient setting:  Chest pain as well as gait instability. Patient also needs to be followed for a bowel program to make sure he is not becoming constipated on pain medication  Time coordinating discharge: 35 minutes  The results of significant diagnostics from this hospitalization (including imaging, microbiology, ancillary and laboratory) are listed below for reference.    Significant Diagnostic Studies: Dg Ribs Unilateral W/chest Right  11/12/2011  *RADIOLOGY REPORT*  Clinical Data: Recent fall.  Right rib pain.  RIGHT RIBS AND CHEST - 3+ VIEW  Comparison: 01/04/2012chest x-ray and 09/20/2011portable abdomen.  Findings: There are fractures of the lateral right seventh through tenth ribs.  There is no pneumothorax or hemothorax. There is a compression fracture of the L1 vertebral body. This appears mildly increased when compared to a 07/18/2010 portable abdomen view. There are mildly accentuated bronchial markings.  There has been a previous median sternotomy.  The heart is normal in size.  There is an aorto - iliac stent present.  IMPRESSION:  1.  Acute fractures of the lateral right seventh through tenth ribs.  No pneumothorax. 2.  L1 vertebral compression fracture.  This appears mildly increased when compared to the previous portable abdomen dated 07/18/2010.  However, this change may be projectional.  Original Report Authenticated By: Rolla Plate, M.D.   Dg Pelvis 1-2 Views  11/12/2011  *RADIOLOGY REPORT*  Clinical Data: Fall, right pelvic pain.  PELVIS - 1-2 VIEW  Comparison: None  Findings: Changes of prior  aortic stent graft repair.  Surgical clips throughout the pelvis.  Mild degenerative changes in the hip joints bilaterally. No acute bony abnormality.  Specifically, no fracture, subluxation, or dislocation.  Soft tissues are intact.  IMPRESSION: No acute bony abnormality.  Original Report Authenticated By: Cyndie Chime, M.D.   Dg Shoulder Right  11/12/2011  *RADIOLOGY REPORT*  Clinical Data: Right shoulder and arm pain post fall  RIGHT SHOULDER - 2+ VIEW  Comparison: 11/11/2008  Findings: Osseous demineralization. Deformity distal right clavicle post old healed fracture. AC joint alignment normal. No acute fracture, dislocation or bone destruction. Visualized right ribs intact.  IMPRESSION: Old healed distal right clavicular fracture. No acute bony abnormalities.  Original Report Authenticated By: Lollie Marrow, M.D.   Dg Humerus Right  11/12/2011  *RADIOLOGY REPORT*  Clinical Data: Right shoulder and arm pain post fall  RIGHT HUMERUS - 2+ VIEW  Comparison: None  Findings:  Deformity distal right clavicle post old healed fracture. Osseous demineralization. No glenohumeral fracture or dislocation identified.  IMPRESSION: No acute right humeral abnormalities.  Original Report Authenticated By: Lollie Marrow, M.D.    Microbiology: No results found for this or any previous visit (from the past 240 hour(s)).   Labs: Results for orders placed during the hospital encounter of 11/12/11  CBC      Component Value Range   WBC 9.5  4.0 - 10.5 (K/uL)   RBC 4.23  4.22 - 5.81 (MIL/uL)   Hemoglobin 12.1 (*) 13.0 - 17.0 (g/dL)   HCT 16.1 (*) 09.6 - 52.0 (%)   MCV 86.5  78.0 - 100.0 (fL)   MCH 28.6  26.0 - 34.0 (pg)   MCHC 33.1  30.0 - 36.0 (g/dL)   RDW 04.5  40.9 - 81.1 (%)   Platelets 171  150 - 400 (K/uL)  DIFFERENTIAL      Component Value Range   Neutrophils Relative 65  43 - 77 (%)   Lymphocytes Relative 22  12 - 46 (%)   Monocytes Relative 8  3 - 12 (%)   Eosinophils Relative 5  0 - 5 (%)    Basophils Relative 0  0 - 1 (%)   Neutro Abs 6.1  1.7 - 7.7 (K/uL)   Lymphs Abs 2.1  0.7 - 4.0 (K/uL)   Monocytes Absolute 0.8  0.1 - 1.0 (K/uL)   Eosinophils Absolute 0.5  0.0 - 0.7 (K/uL)   Basophils Absolute 0.0  0.0 - 0.1 (K/uL)   WBC Morphology ATYPICAL LYMPHOCYTES     Smear Review PLATELET CLUMPS NOTED ON SMEAR    BASIC METABOLIC PANEL      Component Value Range   Sodium 141  135 - 145 (mEq/L)   Potassium 4.7  3.5 - 5.1 (mEq/L)   Chloride 103  96 - 112 (mEq/L)   CO2 28  19 - 32 (mEq/L)   Glucose, Bld 102 (*) 70 - 99 (mg/dL)   BUN 25 (*) 6 - 23 (mg/dL)   Creatinine, Ser 9.14 (*) 0.50 - 1.35 (mg/dL)   Calcium 9.7  8.4 - 78.2 (mg/dL)   GFR calc non Af Amer 42 (*) >90 (mL/min)   GFR calc Af Amer 49 (*) >90 (mL/min)  BASIC METABOLIC PANEL      Component Value Range   Sodium 141  135 - 145 (mEq/L)   Potassium 3.9  3.5 - 5.1 (mEq/L)   Chloride 104  96 - 112 (mEq/L)   CO2 28  19 - 32 (mEq/L)   Glucose, Bld 114 (*) 70 - 99 (mg/dL)   BUN 20  6 - 23 (mg/dL)   Creatinine, Ser 9.56  0.50 - 1.35 (mg/dL)   Calcium 8.9  8.4 - 21.3 (mg/dL)   GFR calc non Af Amer 58 (*) >90 (mL/min)   GFR calc Af Amer 67 (*) >90 (mL/min)  CBC      Component Value Range   WBC 8.5  4.0 - 10.5 (K/uL)   RBC 4.06 (*) 4.22 - 5.81 (MIL/uL)   Hemoglobin 11.6 (*) 13.0 - 17.0 (g/dL)   HCT 08.6 (*) 57.8 - 52.0 (%)   MCV 86.9  78.0 - 100.0 (fL)   MCH 28.6  26.0 - 34.0 (pg)   MCHC 32.9  30.0 - 36.0 (g/dL)   RDW 46.9  62.9 - 52.8 (%)   Platelets    150 - 400 (K/uL)   Value: PLATELET CLUMPS NOTED ON SMEAR, COUNT APPEARS  ADEQUATE  URINALYSIS, ROUTINE W REFLEX MICROSCOPIC      Component Value Range   Color, Urine YELLOW  YELLOW    APPearance CLOUDY (*) CLEAR    Specific Gravity, Urine 1.009  1.005 - 1.030    pH 6.0  5.0 - 8.0    Glucose, UA NEGATIVE  NEGATIVE (mg/dL)   Hgb urine dipstick MODERATE (*) NEGATIVE    Bilirubin Urine NEGATIVE  NEGATIVE    Ketones, ur NEGATIVE  NEGATIVE (mg/dL)   Protein, ur  NEGATIVE  NEGATIVE (mg/dL)   Urobilinogen, UA 1.0  0.0 - 1.0 (mg/dL)   Nitrite POSITIVE (*) NEGATIVE    Leukocytes, UA MODERATE (*) NEGATIVE   URINE MICROSCOPIC-ADD ON      Component Value Range   Squamous Epithelial / LPF RARE  RARE    WBC, UA 21-50  <3 (WBC/hpf)   RBC / HPF 3-6  <3 (RBC/hpf)   Bacteria, UA MANY (*) RARE      Signed: Briston Lax NEVILL 11/14/2011, 8:08 AM

## 2011-11-14 NOTE — Progress Notes (Signed)
Clinical Social Work-CSW contacted dtr per pt request, discussed d/c and she will be completing paperwork at noon.  SNF will have room ready at 1400 and CSW will facilitate transfer-Jacquelene Kopecky-MSW, [phone]

## 2011-11-14 NOTE — Progress Notes (Signed)
Physical Therapy Treatment Patient Details Name: Logan Allen MRN: [id number] DOB: Oct 08, 1924 Today's Date: 11/14/2011  PT Assessment/Plan  PT - Assessment/Plan Comments on Treatment Session: Spoke with pt Re: d/c plans & pt states that plans are for pt to d/c to [facility] prior to home.  D/C plans updated.  PT Frequency: Min 3X/week Follow Up Recommendations: Skilled nursing facility Equipment Recommended: Defer to next venue PT Goals  Acute Rehab PT Goals PT Goal: Supine/Side to Sit - Progress: Progressing toward goal PT Goal: Sit to Stand - Progress: Progressing toward goal PT Goal: Stand to Sit - Progress: Progressing toward goal PT Goal: Ambulate - Progress: Progressing toward goal PT Goal: Perform Home Exercise Program - Progress: Progressing toward goal  PT Treatment Precautions/Restrictions  Precautions Precautions: Fall Precaution Comments: Uses RW at home PTA; fell on right side, weight bearing on RUE painful Required Braces or Orthoses: No Restrictions Weight Bearing Restrictions: No Mobility (including Balance) Bed Mobility Bed Mobility: Yes Supine to Sit: 4: Min assist Supine to Sit Details (indicate cue type and reason): (A) to lift shoulders/trunk to sitting upright.  Cues for technique to increase ease of transition & decrease discomfort due to back & rib pain.   Sitting - Scoot to Edge of Bed: 5: Supervision Transfers Sit to Stand: Other (comment);From bed;With upper extremity assist;From chair/3-in-1 (Min Guard (A)) Sit to Stand Details (indicate cue type and reason): Guarding for safety; increased time to stand.  Cues for hand placement & technique.  Performed 4x's.   Stand to Sit: Other (comment);To chair/3-in-1;With armrests;With upper extremity assist (Min Guard (A)) Stand to Sit Details: Cues for safety, body positioning before sitting, hand placement.   Ambulation/Gait Ambulation/Gait Assistance: Other (comment) (Min Guard (A)) Ambulation/Gait Assistance  Details (indicate cue type and reason): Cues for safe management of RW, stay inside RW especially with turns, look up, increase erect posture.  Pt with very small steps (shuffle-like), slow gait speed.   Ambulation Distance (Feet): 85 Feet Assistive device: Rolling walker Gait Pattern: Decreased step length - right;Decreased step length - left;Decreased stride length;Trunk flexed;Shuffle Stairs: No Wheelchair Mobility Wheelchair Mobility: No    Exercise  General Exercises - Lower Extremity Ankle Circles/Pumps: AROM;Both;10 reps;Supine Gluteal Sets: AROM;Both;10 reps;Supine Hip ABduction/ADduction: Strengthening;AROM;Both;5 reps;Supine Straight Leg Raises: Strengthening;AROM;Both;10 reps;Supine End of Session PT - End of Session Activity Tolerance: Patient limited by pain;Patient limited by fatigue (ambulation distance limited by pain/fatigue per pt. ) Patient left: in chair;with call bell in reach Nurse Communication: Mobility status for ambulation;Mobility status for transfers General Behavior During Session: [facility] for tasks performed Cognition: [facility] for tasks performed  Lara Mulch 11/14/2011, 12:06 PM [phone]

## 2011-11-14 NOTE — Progress Notes (Addendum)
Occupational Therapy Evaluation Patient Details Name: Logan Allen MRN: [id number] DOB: May 31, 1924 Today's Date: 11/14/2011  Problem List:  Patient Active Problem List  Diagnoses  . HYPERLIPIDEMIA  . HYPERTENSION  . ANGINA PECTORIS  . DYSPNEA  . COUGH  . HEART MURMUR, HX OF  . Dementia  . COPD (chronic obstructive pulmonary disease)  . Fall from slipping on slippery surface  . ARF (acute renal failure)  . Depression  . Multiple rib fractures  . B12 deficiency  . Gout  . UTI (lower urinary tract infection)    Past Medical History:  Past Medical History  Diagnosis Date  . Coronary artery disease    Past Surgical History: No past surgical history on file.  OT Assessment/Plan/Recommendation OT Assessment Clinical Impression Statement: This 78 y.o. male presents to OT s/p fall and subsequent rib fractures.  Pt. presents with the below listed problems, and would benefit from OT to maximize safety and independence with BADLs to allow him to return to modified independent level after SNF level rehab OT Recommendation/Assessment: Patient will need skilled OT in the acute care venue OT Problem List: Decreased strength;Decreased activity tolerance;Impaired balance (sitting and/or standing);Decreased cognition;Decreased safety awareness;Decreased knowledge of use of DME or AE;Pain Barriers to Discharge: Decreased caregiver support OT Therapy Diagnosis : Generalized weakness;Cognitive deficits OT Plan OT Frequency: Min 1X/week OT Treatment/Interventions: Self-care/ADL training;DME and/or AE instruction;Therapeutic activities;Balance training;Patient/family education OT Recommendation Follow Up Recommendations: Skilled nursing facility Equipment Recommended: Defer to next venue Individuals Consulted Consulted and Agree with Results and Recommendations: Patient;Family member/caregiver Family Member Consulted: son in law OT Goals Acute Rehab OT Goals OT Goal Formulation: With  patient/family Time For Goal Achievement: 2 weeks ADL Goals Pt Will Perform Grooming: with min assist;Standing at sink (min guard assist) ADL Goal: Grooming - Progress: Goal set today Pt Will Perform Upper Body Bathing: with set-up;Unsupported;Sitting, chair ADL Goal: Upper Body Bathing - Progress: Goal set today Pt Will Perform Lower Body Bathing: with min assist;Sit to stand from bed;Sit to stand from chair ADL Goal: Lower Body Bathing - Progress: Goal set today Pt Will Perform Upper Body Dressing: with set-up;Unsupported;Sitting, bed;Sitting, chair ADL Goal: Upper Body Dressing - Progress: Goal set today Pt Will Perform Lower Body Dressing: with min assist;Sit to stand from chair;Sit to stand from bed ADL Goal: Lower Body Dressing - Progress: Goal set today Pt Will Transfer to Toilet: with min assist;Ambulation;Comfort height toilet;Grab bars (min guard assist) ADL Goal: Toilet Transfer - Progress: Goal set today Pt Will Perform Toileting - Clothing Manipulation: with supervision;Standing ADL Goal: Toileting - Clothing Manipulation - Progress: Goal set today Pt Will Perform Toileting - Hygiene: Independently;Sitting on 3-in-1 or toilet ADL Goal: Toileting - Hygiene - Progress: Goal set today  OT Evaluation Precautions/Restrictions  Precautions Precautions: Fall Precaution Comments: Uses RW at home PTA; fell on right side, weight bearing on RUE painful Required Braces or Orthoses: No Restrictions Weight Bearing Restrictions: No Prior Functioning Home Living Lives With: Alone Receives Help From: Family Type of Home: House Home Layout: One level Home Access: Level entry Home Adaptive Equipment: Straight cane;Walker - rolling Additional Comments: Pt. for SNF placement Prior Function Level of Independence: Independent with basic ADLs;Requires assistive device for independence;Independent with transfers Able to Take Stairs?: Yes Driving: Yes ADL ADL Eating/Feeding:  Simulated;Independent Where Assessed - Eating/Feeding: Bed level Grooming: Performed;Wash/dry hands;Brushing hair;Minimal assistance Where Assessed - Grooming: Standing at sink Upper Body Bathing: Simulated;Moderate assistance Where Assessed - Upper Body Bathing: Sitting, chair Lower Body Bathing: Simulated;Maximal  assistance Where Assessed - Lower Body Bathing: Sit to stand from chair;Sit to stand from bed Upper Body Dressing: Simulated;Moderate assistance (due to pain) Where Assessed - Upper Body Dressing: Unsupported;Sitting, bed Lower Body Dressing: Performed;Maximal assistance (due to pain) Where Assessed - Lower Body Dressing: Sit to stand from bed Toilet Transfer: Simulated;Minimal assistance Toilet Transfer Method: Ambulating Toilet Transfer Equipment: Comfort height toilet;Grab bars Toileting - Clothing Manipulation: Simulated;Minimal assistance Where Assessed - Glass blower/designer Manipulation: Standing Toileting - Hygiene: Simulated;Minimal assistance Where Assessed - Toileting Hygiene: Sit to stand from 3-in-1 or toilet Equipment Used: Rolling walker Ambulation Related to ADLs: Pt. requires min A for ambulation.  Requires min verbal cues for walker safety ADL Comments: Pt. limited by pain.  Pt. instructed in deep/pursed lip breathing exercises Vision/Perception  Vision - History Baseline Vision: Bifocals Vision - Assessment Vision Assessment: Vision not tested Cognition Cognition Arousal/Alertness: Awake/alert Overall Cognitive Status: Impaired Memory: Appears impaired Memory Deficits: Unable to recall events of day accurately - may be due to medications Orientation Level: Oriented to person;Oriented to place;Oriented to situation Safety/Judgement: Decreased safety judgement for tasks assessed Sensation/Coordination Sensation Light Touch: Appears Intact Coordination Gross Motor Movements are Fluid and Coordinated: Yes Fine Motor Movements are Fluid and Coordinated:  Yes Extremity Assessment RUE Assessment RUE Assessment: Within Functional Limits LUE Assessment LUE Assessment: Within Functional Limits Mobility  Bed Mobility Bed Mobility: Yes Supine to Sit: 3: Mod assist Sitting - Scoot to Edge of Bed: 4: Min assist Transfers Transfers: Yes Sit to Stand: 4: Min assist Stand to Sit: 4: Min assist Exercises   End of Session OT - End of Session Activity Tolerance: Patient limited by fatigue;Patient limited by pain Patient left: in chair;with call bell in reach;with family/visitor present Nurse Communication: Mobility status for transfers;Mobility status for ambulation General Behavior During Session: Restless   Prabhleen Montemayor, Ursula Alert M 11/14/2011, 10:13 AM   Late Entry for eval completed 11/13/11

## 2011-11-14 NOTE — Progress Notes (Signed)
Agree with updated d/c plan of SNF.    11/14/2011 Cephus Shelling, PT, DPT [phone]

## 2011-11-15 LAB — PSA: PSA: <0.01 ng/mL — ABNORMAL LOW (ref <=4.00)

## 2011-11-15 NOTE — Progress Notes (Signed)
Patient discharged to [facility] living transported by [facility]. Alert and oriented X2, not in any distress.

## 2011-11-15 NOTE — Progress Notes (Signed)
Subjective: Slowly feeling better. For tx to [facility] for Rehab today  Objective: Weight change:   Intake/Output Summary (Last 24 hours) at 11/15/11 0623 Last data filed at 11/14/11 1130  Gross per 24 hour  Intake    600 ml  Output      0 ml  Net    600 ml   Filed Vitals:   11/14/11 1606 11/14/11 2049 11/14/11 2200 11/14/11 2208  BP:  122/41 128/67 120/56  Pulse:  90 90 86  Temp:  98.7 F (37.1 C)    TempSrc:  Oral    Resp:  20    SpO2: 88% 93%     General: Alert and oriented x3, mild distress when he tries to move or take a deep breath causing some pain on his right side mostly in his chest some in his arm.  HEENT: Normocephalic, atraumatic, mucous membranes are slightly dry  Cardiovascular: Regular rate and rhythm, S1-S2, 2/6 systolic ejection murmur  Lungs: Decreased breath sounds throughout, poor inspiratory effort secondary to pain  Abdomen: Soft, nontender, nondistended, positive bowel sounds extremities: No clubbing or cyanosis, trace pitting edema. I attempted to raise and abduct his right arm which cause some pain more so in the right arm than the shoulder. There was no limited abd    Lab Results:  Basename 11/13/11 0621 11/12/11 1101  NA 141 141  K 3.9 4.7  CL 104 103  CO2 28 28  GLUCOSE 114* 102*  BUN 20 25*  CREATININE 1.12 1.46*  CALCIUM 8.9 9.7  MG -- --  PHOS -- --   No results found for this basename: AST:2,ALT:2,ALKPHOS:2,BILITOT:2,PROT:2,ALBUMIN:2 in the last 72 hours No results found for this basename: LIPASE:2,AMYLASE:2 in the last 72 hours  Basename 11/13/11 0621 11/12/11 1101  WBC 8.5 9.5  NEUTROABS -- 6.1  HGB 11.6* 12.1*  HCT 35.3* 36.6*  MCV 86.9 86.5  PLT PLATELET CLUMPS NOTED ON SMEAR, COUNT APPEARS ADEQUATE 171   No results found for this basename: CKTOTAL:3,CKMB:3,CKMBINDEX:3,TROPONINI:3 in the last 72 hours No components found with this basename: POCBNP:3 No results found for this basename: DDIMER:2 in the last 72 hours No results  found for this basename: HGBA1C:2 in the last 72 hours No results found for this basename: CHOL:2,HDL:2,LDLCALC:2,TRIG:2,CHOLHDL:2,LDLDIRECT:2 in the last 72 hours No results found for this basename: TSH,T4TOTAL,FREET3,T3FREE,THYROIDAB in the last 72 hours  Basename 11/14/11 0546  VITAMINB12 725  FOLATE --  FERRITIN --  TIBC --  IRON --  RETICCTPCT --    Studies/Results: No results found. Medications: Scheduled Meds:   . albuterol  2.5 mg Nebulization QID  . citalopram  20 mg Oral Daily  . donepezil  10 mg Oral QHS  . memantine  10 mg Oral BID  . metoprolol  25 mg Oral BID  . oxyCODONE  10 mg Oral Q12H  . polyethylene glycol  17 g Oral Daily  . senna-docusate  1 tablet Oral BID  . Tamsulosin HCl  0.4 mg Oral QHS   Continuous Infusions:  PRN Meds:.acetaminophen, acetaminophen, albuterol, ondansetron (ZOFRAN) IV, ondansetron, oxyCODONE, polyethylene glycol  Assessment/Plan: Patient Active Problem List  Diagnoses  Date Noted  .  Dementia - mild to moderate  11/12/2011  .  COPD (chronic obstructive pulmonary disease) - symptomatically stable  11/12/2011  .  Fall from slipping on slippery surface  11/12/2011  .  ARF (acute renal failure)  11/12/2011  .  Depression - stable  11/12/2011  .  Multiple rib fractures -  plans for  transfer to skilled nursing facility for rehabilitation today 11/12/2011  .  COUGH  04/12/2008  .  HYPERLIPIDEMIA  03/19/2008  .  HYPERTENSION - controlled  03/19/2008  .  ANGINA PECTORIS - asymptomatic  03/19/2008  .  DYSPNEA  03/19/2008  . HEART MURMUR, HX OF  Prostate cancer  Disposition - to rehabilitation today Constipation - MiraLAX and Senokot therapy     LOS: 3 days   [name] 11/15/2011, 6:23 AM

## 2011-11-15 NOTE — Progress Notes (Signed)
[name] is being discharged to [facility] today for short-term rehab.  Discharge information forwarded to facility. CSW facilitated transport to facility via ambulance. Patient's daughter, Logan Allen contacted to advise that ambulance transport arranged.  Genelle Bal, MSW, LCSW [phone]

## 2011-11-16 ENCOUNTER — Encounter: Payer: Medicare Other | Admitting: Rehabilitative and Restorative Service Providers"

## 2011-11-21 ENCOUNTER — Encounter: Payer: Medicare Other | Admitting: Physical Therapy

## 2011-11-26 ENCOUNTER — Encounter: Payer: Medicare Other | Admitting: Physical Therapy

## 2011-11-29 ENCOUNTER — Encounter: Payer: Medicare Other | Admitting: Physical Therapy
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
## 2012-01-18 IMAGING — CR DG CHEST 1V PORT
1 series · 1 of 1 positions shown · non-contrast
Comparison: 07/03/2010.

CLINICAL DATA: History of rib fracture.

PORTABLE CHEST - 1 VIEW

[view not recorded]
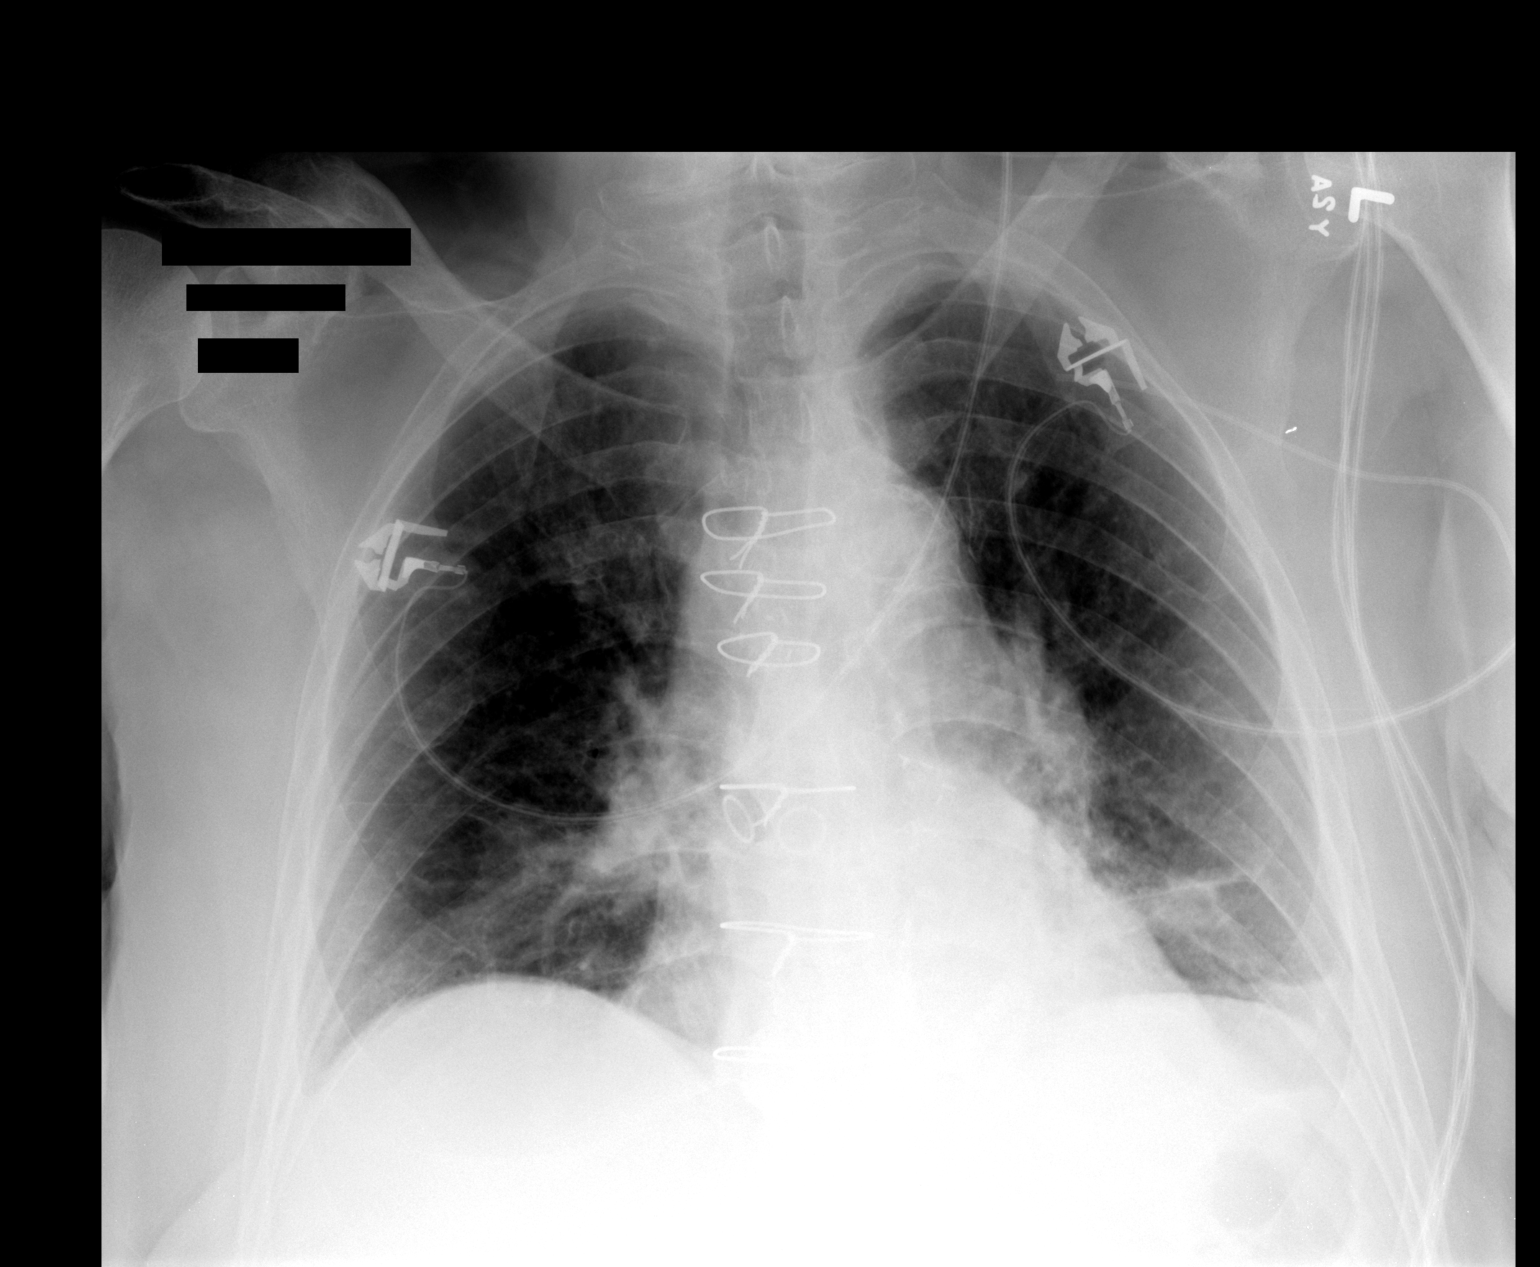

[1 of 1 positions shown; findings below may reference images not displayed]

FINDINGS: The cardiac silhouette is normal size and shape. Ectasia
and nonaneurysmal calcification of the thoracic aorta is seen. The
patient has undergone previous median sternotomy and coronary
artery bypass grafting. There is minimal atelectasis in the left
base.  No pneumothorax is evident.  There is again question of
possible fractures of posterior aspects of left seventh and eighth
ribs but not definite.  No pleural effusion is evident on the
right.  There may be a small amount of pleural fluid or thickening
on the left.
IMPRESSION: There is minimal atelectasis in the left base.  No pneumothorax is
evident.  There is again question of possible fractures of
posterior aspects of left seventh and eighth ribs but not
definite.  No pleural effusion is evident on the right.  There may
be a small amount of pleural fluid or thickening on the left.

## 2012-04-10 ENCOUNTER — Ambulatory Visit: Payer: PRIVATE HEALTH INSURANCE | Attending: Internal Medicine | Admitting: *Deleted

## 2012-04-10 DIAGNOSIS — R5381 Other malaise: Secondary | ICD-10-CM | POA: Insufficient documentation

## 2012-04-10 DIAGNOSIS — M6281 Muscle weakness (generalized): Secondary | ICD-10-CM | POA: Insufficient documentation

## 2012-04-10 DIAGNOSIS — IMO0001 Reserved for inherently not codable concepts without codable children: Secondary | ICD-10-CM | POA: Insufficient documentation

## 2012-04-10 DIAGNOSIS — R269 Unspecified abnormalities of gait and mobility: Secondary | ICD-10-CM | POA: Insufficient documentation

## 2012-04-11 ENCOUNTER — Ambulatory Visit: Payer: PRIVATE HEALTH INSURANCE | Admitting: *Deleted

## 2012-04-17 ENCOUNTER — Ambulatory Visit: Payer: Medicare Other | Admitting: Physical Therapy

## 2012-04-18 ENCOUNTER — Ambulatory Visit: Payer: Medicare Other | Admitting: Physical Therapy

## 2012-04-22 ENCOUNTER — Ambulatory Visit: Payer: PRIVATE HEALTH INSURANCE | Admitting: Physical Therapy

## 2012-04-28 ENCOUNTER — Ambulatory Visit: Payer: PRIVATE HEALTH INSURANCE | Attending: Internal Medicine | Admitting: Physical Therapy

## 2012-04-28 DIAGNOSIS — R5381 Other malaise: Secondary | ICD-10-CM | POA: Insufficient documentation

## 2012-04-28 DIAGNOSIS — IMO0001 Reserved for inherently not codable concepts without codable children: Secondary | ICD-10-CM | POA: Insufficient documentation

## 2012-04-28 DIAGNOSIS — M6281 Muscle weakness (generalized): Secondary | ICD-10-CM | POA: Insufficient documentation

## 2012-04-28 DIAGNOSIS — R269 Unspecified abnormalities of gait and mobility: Secondary | ICD-10-CM | POA: Insufficient documentation

## 2012-04-30 ENCOUNTER — Ambulatory Visit: Payer: PRIVATE HEALTH INSURANCE | Admitting: Physical Therapy

## 2012-05-05 ENCOUNTER — Encounter: Payer: Medicare Other | Admitting: Physical Therapy

## 2012-05-07 ENCOUNTER — Encounter: Payer: Medicare Other | Admitting: Physical Therapy

## 2012-05-13 ENCOUNTER — Encounter: Payer: Medicare Other | Admitting: Physical Therapy

## 2012-05-16 ENCOUNTER — Encounter: Payer: Medicare Other | Admitting: Physical Therapy

## 2012-05-19 ENCOUNTER — Ambulatory Visit: Payer: PRIVATE HEALTH INSURANCE | Admitting: Physical Therapy

## 2012-05-27 ENCOUNTER — Ambulatory Visit: Payer: PRIVATE HEALTH INSURANCE | Admitting: Physical Therapy

## 2012-06-03 ENCOUNTER — Ambulatory Visit: Payer: PRIVATE HEALTH INSURANCE | Attending: Internal Medicine | Admitting: Physical Therapy

## 2012-06-03 DIAGNOSIS — R269 Unspecified abnormalities of gait and mobility: Secondary | ICD-10-CM | POA: Insufficient documentation

## 2012-06-03 DIAGNOSIS — R5381 Other malaise: Secondary | ICD-10-CM | POA: Insufficient documentation

## 2012-06-03 DIAGNOSIS — M6281 Muscle weakness (generalized): Secondary | ICD-10-CM | POA: Insufficient documentation

## 2012-06-03 DIAGNOSIS — IMO0001 Reserved for inherently not codable concepts without codable children: Secondary | ICD-10-CM | POA: Insufficient documentation

## 2012-06-17 ENCOUNTER — Ambulatory Visit: Payer: Medicare Other | Admitting: Physical Therapy

## 2012-08-20 ENCOUNTER — Emergency Department (HOSPITAL_COMMUNITY): Payer: PRIVATE HEALTH INSURANCE

## 2012-08-20 ENCOUNTER — Inpatient Hospital Stay (HOSPITAL_COMMUNITY)
Admission: EM | Admit: 2012-08-20 | Discharge: 2012-08-27 | DRG: 478 | Disposition: A | Discharge status: Skilled Nursing Facility | Payer: PRIVATE HEALTH INSURANCE | Attending: Internal Medicine | Admitting: Internal Medicine

## 2012-08-20 ENCOUNTER — Encounter (HOSPITAL_COMMUNITY): Payer: Self-pay

## 2012-08-20 DIAGNOSIS — Z79899 Other long term (current) drug therapy: Secondary | ICD-10-CM

## 2012-08-20 DIAGNOSIS — I10 Essential (primary) hypertension: Secondary | ICD-10-CM | POA: Diagnosis present

## 2012-08-20 DIAGNOSIS — IMO0002 Reserved for concepts with insufficient information to code with codable children: Secondary | ICD-10-CM

## 2012-08-20 DIAGNOSIS — E860 Dehydration: Secondary | ICD-10-CM | POA: Diagnosis present

## 2012-08-20 DIAGNOSIS — J449 Chronic obstructive pulmonary disease, unspecified: Secondary | ICD-10-CM | POA: Diagnosis present

## 2012-08-20 DIAGNOSIS — Z87891 Personal history of nicotine dependence: Secondary | ICD-10-CM

## 2012-08-20 DIAGNOSIS — D72829 Elevated white blood cell count, unspecified: Secondary | ICD-10-CM | POA: Diagnosis present

## 2012-08-20 DIAGNOSIS — Z951 Presence of aortocoronary bypass graft: Secondary | ICD-10-CM

## 2012-08-20 DIAGNOSIS — M79604 Pain in right leg: Secondary | ICD-10-CM | POA: Diagnosis present

## 2012-08-20 DIAGNOSIS — F0390 Unspecified dementia without behavioral disturbance: Secondary | ICD-10-CM | POA: Diagnosis present

## 2012-08-20 DIAGNOSIS — R0609 Other forms of dyspnea: Secondary | ICD-10-CM

## 2012-08-20 DIAGNOSIS — M5410 Radiculopathy, site unspecified: Secondary | ICD-10-CM | POA: Diagnosis present

## 2012-08-20 DIAGNOSIS — Z954 Presence of other heart-valve replacement: Secondary | ICD-10-CM

## 2012-08-20 DIAGNOSIS — G4733 Obstructive sleep apnea (adult) (pediatric): Secondary | ICD-10-CM | POA: Diagnosis present

## 2012-08-20 DIAGNOSIS — Z66 Do not resuscitate: Secondary | ICD-10-CM | POA: Diagnosis present

## 2012-08-20 DIAGNOSIS — N179 Acute kidney failure, unspecified: Secondary | ICD-10-CM | POA: Diagnosis present

## 2012-08-20 DIAGNOSIS — E7800 Pure hypercholesterolemia, unspecified: Secondary | ICD-10-CM | POA: Diagnosis present

## 2012-08-20 DIAGNOSIS — M549 Dorsalgia, unspecified: Secondary | ICD-10-CM | POA: Diagnosis present

## 2012-08-20 DIAGNOSIS — E039 Hypothyroidism, unspecified: Secondary | ICD-10-CM | POA: Diagnosis present

## 2012-08-20 DIAGNOSIS — R0600 Dyspnea, unspecified: Secondary | ICD-10-CM

## 2012-08-20 DIAGNOSIS — Z8546 Personal history of malignant neoplasm of prostate: Secondary | ICD-10-CM

## 2012-08-20 DIAGNOSIS — J4489 Other specified chronic obstructive pulmonary disease: Secondary | ICD-10-CM | POA: Diagnosis present

## 2012-08-20 DIAGNOSIS — H109 Unspecified conjunctivitis: Secondary | ICD-10-CM | POA: Diagnosis present

## 2012-08-20 DIAGNOSIS — N390 Urinary tract infection, site not specified: Secondary | ICD-10-CM | POA: Diagnosis present

## 2012-08-20 DIAGNOSIS — Y92009 Unspecified place in unspecified non-institutional (private) residence as the place of occurrence of the external cause: Secondary | ICD-10-CM

## 2012-08-20 DIAGNOSIS — W19XXXA Unspecified fall, initial encounter: Secondary | ICD-10-CM | POA: Diagnosis present

## 2012-08-20 DIAGNOSIS — F3289 Other specified depressive episodes: Secondary | ICD-10-CM | POA: Diagnosis present

## 2012-08-20 DIAGNOSIS — F329 Major depressive disorder, single episode, unspecified: Secondary | ICD-10-CM | POA: Diagnosis present

## 2012-08-20 DIAGNOSIS — A498 Other bacterial infections of unspecified site: Secondary | ICD-10-CM | POA: Diagnosis present

## 2012-08-20 DIAGNOSIS — I2510 Atherosclerotic heart disease of native coronary artery without angina pectoris: Secondary | ICD-10-CM | POA: Diagnosis present

## 2012-08-20 DIAGNOSIS — F411 Generalized anxiety disorder: Secondary | ICD-10-CM | POA: Diagnosis present

## 2012-08-20 DIAGNOSIS — S22009A Unspecified fracture of unspecified thoracic vertebra, initial encounter for closed fracture: Principal | ICD-10-CM | POA: Diagnosis present

## 2012-08-20 DIAGNOSIS — M109 Gout, unspecified: Secondary | ICD-10-CM | POA: Diagnosis present

## 2012-08-20 DIAGNOSIS — Z7982 Long term (current) use of aspirin: Secondary | ICD-10-CM

## 2012-08-20 DIAGNOSIS — R253 Fasciculation: Secondary | ICD-10-CM | POA: Diagnosis not present

## 2012-08-20 DIAGNOSIS — Z9181 History of falling: Secondary | ICD-10-CM

## 2012-08-20 HISTORY — DX: Malignant neoplasm of prostate: C61

## 2012-08-20 HISTORY — DX: Gout, unspecified: M10.9

## 2012-08-20 HISTORY — DX: Unspecified osteoarthritis, unspecified site: M19.90

## 2012-08-20 HISTORY — DX: Hypothyroidism, unspecified: E03.9

## 2012-08-20 HISTORY — DX: Chronic obstructive pulmonary disease, unspecified: J44.9

## 2012-08-20 HISTORY — DX: Repeated falls: R29.6

## 2012-08-20 HISTORY — DX: Other forms of dyspnea: R06.09

## 2012-08-20 HISTORY — DX: Other chronic pain: G89.29

## 2012-08-20 HISTORY — DX: Low back pain: M54.5

## 2012-08-20 HISTORY — DX: Dyspnea, unspecified: R06.00

## 2012-08-20 HISTORY — DX: Other specified postprocedural states: Z98.890

## 2012-08-20 HISTORY — DX: Unspecified urinary incontinence: R32

## 2012-08-20 HISTORY — DX: Mental disorder, not otherwise specified: F99

## 2012-08-20 HISTORY — DX: Low back pain, unspecified: M54.50

## 2012-08-20 HISTORY — DX: Obstructive sleep apnea (adult) (pediatric): G47.33

## 2012-08-20 HISTORY — DX: Essential (primary) hypertension: I10

## 2012-08-20 HISTORY — DX: Anxiety disorder, unspecified: F41.9

## 2012-08-20 HISTORY — DX: Pure hypercholesterolemia, unspecified: E78.00

## 2012-08-20 HISTORY — DX: Personal history of other medical treatment: Z92.89

## 2012-08-20 LAB — CBC
Hemoglobin: 10.8 g/dL — ABNORMAL LOW (ref 13.0–17.0)
MCH: 28.1 pg (ref 26.0–34.0)
MCHC: 32.7 g/dL (ref 30.0–36.0)
Platelets: ADEQUATE 10*3/uL (ref 150–400)
RDW: 14.4 % (ref 11.5–15.5)

## 2012-08-20 LAB — CBC WITH DIFFERENTIAL/PLATELET
Basophils Absolute: 0 10*3/uL (ref 0.0–0.1)
Basophils Relative: 0 % (ref 0–1)
Eosinophils Relative: 5 % (ref 0–5)
Lymphocytes Relative: 17 % (ref 12–46)
MCHC: 33.2 g/dL (ref 30.0–36.0)
MCV: 86.3 fL (ref 78.0–100.0)
Neutro Abs: 7.4 10*3/uL (ref 1.7–7.7)
Platelets: 124 10*3/uL — ABNORMAL LOW (ref 150–400)
RDW: 14.5 % (ref 11.5–15.5)
WBC: 10.6 10*3/uL — ABNORMAL HIGH (ref 4.0–10.5)

## 2012-08-20 LAB — COMPREHENSIVE METABOLIC PANEL
ALT: 10 U/L (ref 0–53)
AST: 23 U/L (ref 0–37)
Albumin: 3.4 g/dL — ABNORMAL LOW (ref 3.5–5.2)
CO2: 23 mEq/L (ref 19–32)
Calcium: 9 mg/dL (ref 8.4–10.5)
Creatinine, Ser: 1.53 mg/dL — ABNORMAL HIGH (ref 0.50–1.35)
GFR calc non Af Amer: 39 mL/min — ABNORMAL LOW (ref >90)
Sodium: 135 mEq/L (ref 135–145)
Total Protein: 6.4 g/dL (ref 6.0–8.3)

## 2012-08-20 LAB — PROTIME-INR
INR: 1.1 (ref 0.00–1.49)
Prothrombin Time: 14.1 seconds (ref 11.6–15.2)

## 2012-08-20 LAB — APTT: aPTT: 36 seconds (ref 24–37)

## 2012-08-20 LAB — CREATININE, SERUM
Creatinine, Ser: 1.4 mg/dL — ABNORMAL HIGH (ref 0.50–1.35)
GFR calc non Af Amer: 44 mL/min — ABNORMAL LOW (ref >90)

## 2012-08-20 MED ORDER — SODIUM CHLORIDE 0.9 % IV SOLN
INTRAVENOUS | Status: DC
  Administered 2012-08-20: 23:00:00 via INTRAVENOUS

## 2012-08-20 MED ORDER — ONDANSETRON HCL 4 MG PO TABS: 4.0000 mg | ORAL_TABLET | Freq: Four times a day (QID) | ORAL | Status: DC | PRN

## 2012-08-20 MED ORDER — HYDROCODONE-ACETAMINOPHEN 10-325 MG PO TABS
1.0000 | ORAL_TABLET | Freq: Four times a day (QID) | ORAL | Status: DC
  Administered 2012-08-20 – 2012-08-21 (×2): 1 via ORAL
  Filled 2012-08-20 (×2): qty 1

## 2012-08-20 MED ORDER — MEMANTINE HCL 10 MG PO TABS
10.0000 mg | ORAL_TABLET | Freq: Two times a day (BID) | ORAL | Status: DC
  Administered 2012-08-20 – 2012-08-27 (×14): 10 mg via ORAL
  Filled 2012-08-20 (×15): qty 1

## 2012-08-20 MED ORDER — ALBUTEROL SULFATE (5 MG/ML) 0.5% IN NEBU
2.5000 mg | INHALATION_SOLUTION | Freq: Three times a day (TID) | RESPIRATORY_TRACT | Status: DC
  Administered 2012-08-20 – 2012-08-26 (×16): 2.5 mg via RESPIRATORY_TRACT
  Filled 2012-08-20 (×18): qty 0.5

## 2012-08-20 MED ORDER — METOPROLOL SUCCINATE ER 25 MG PO TB24
25.0000 mg | ORAL_TABLET | Freq: Every day | ORAL | Status: DC
  Administered 2012-08-20 – 2012-08-27 (×8): 25 mg via ORAL
  Filled 2012-08-20 (×8): qty 1

## 2012-08-20 MED ORDER — SODIUM CHLORIDE 0.9 % IV SOLN
250.0000 mL | INTRAVENOUS | Status: DC | PRN
  Administered 2012-08-26: 250 mL via INTRAVENOUS

## 2012-08-20 MED ORDER — DONEPEZIL HCL 10 MG PO TABS
10.0000 mg | ORAL_TABLET | Freq: Every day | ORAL | Status: DC
  Administered 2012-08-20 – 2012-08-26 (×7): 10 mg via ORAL
  Filled 2012-08-20 (×8): qty 1

## 2012-08-20 MED ORDER — HEPARIN SODIUM (PORCINE) 5000 UNIT/ML IJ SOLN
5000.0000 [IU] | Freq: Three times a day (TID) | INTRAMUSCULAR | Status: DC
  Administered 2012-08-20 – 2012-08-22 (×3): 5000 [IU] via SUBCUTANEOUS
  Filled 2012-08-20 (×11): qty 1

## 2012-08-20 MED ORDER — BICALUTAMIDE 50 MG PO TABS
50.0000 mg | ORAL_TABLET | Freq: Every day | ORAL | Status: DC
  Administered 2012-08-20 – 2012-08-27 (×8): 50 mg via ORAL
  Filled 2012-08-20 (×8): qty 1

## 2012-08-20 MED ORDER — HYDROMORPHONE HCL PF 1 MG/ML IJ SOLN: 0.5000 mg | INTRAMUSCULAR | Status: DC | PRN

## 2012-08-20 MED ORDER — CYCLOBENZAPRINE HCL 5 MG PO TABS
5.0000 mg | ORAL_TABLET | Freq: Three times a day (TID) | ORAL | Status: DC
  Administered 2012-08-20 – 2012-08-23 (×8): 5 mg via ORAL
  Filled 2012-08-20 (×10): qty 1

## 2012-08-20 MED ORDER — DIAZEPAM 5 MG PO TABS
10.0000 mg | ORAL_TABLET | Freq: Every day | ORAL | Status: DC
  Administered 2012-08-20 – 2012-08-22 (×3): 10 mg via ORAL
  Filled 2012-08-20: qty 1
  Filled 2012-08-20 (×2): qty 2
  Filled 2012-08-20: qty 1

## 2012-08-20 MED ORDER — TAMSULOSIN HCL 0.4 MG PO CAPS
0.4000 mg | ORAL_CAPSULE | Freq: Every day | ORAL | Status: DC
  Administered 2012-08-20 – 2012-08-26 (×7): 0.4 mg via ORAL
  Filled 2012-08-20 (×9): qty 1

## 2012-08-20 MED ORDER — TIOTROPIUM BROMIDE MONOHYDRATE 18 MCG IN CAPS
18.0000 ug | ORAL_CAPSULE | Freq: Every day | RESPIRATORY_TRACT | Status: DC
Start: 2012-08-20 — End: 2012-08-27
  Administered 2012-08-22 – 2012-08-27 (×5): 18 ug via RESPIRATORY_TRACT
  Filled 2012-08-20 (×2): qty 5

## 2012-08-20 MED ORDER — SODIUM CHLORIDE 0.9 % IJ SOLN
3.0000 mL | Freq: Two times a day (BID) | INTRAMUSCULAR | Status: DC
  Administered 2012-08-20 – 2012-08-27 (×4): 3 mL via INTRAVENOUS

## 2012-08-20 MED ORDER — DULOXETINE HCL 30 MG PO CPEP
30.0000 mg | ORAL_CAPSULE | Freq: Two times a day (BID) | ORAL | Status: DC
  Administered 2012-08-20 – 2012-08-27 (×14): 30 mg via ORAL
  Filled 2012-08-20 (×16): qty 1

## 2012-08-20 MED ORDER — INFLUENZA VIRUS VACC SPLIT PF IM SUSP
0.5000 mL | INTRAMUSCULAR | Status: AC
  Administered 2012-08-21: 0.5 mL via INTRAMUSCULAR
  Filled 2012-08-20: qty 0.5

## 2012-08-20 MED ORDER — HYDROCODONE-ACETAMINOPHEN 5-325 MG PO TABS
1.0000 | ORAL_TABLET | Freq: Once | ORAL | Status: AC
  Administered 2012-08-20: 1 via ORAL
  Filled 2012-08-20: qty 1

## 2012-08-20 MED ORDER — CILOSTAZOL 50 MG PO TABS
50.0000 mg | ORAL_TABLET | Freq: Two times a day (BID) | ORAL | Status: DC
  Administered 2012-08-20 – 2012-08-27 (×14): 50 mg via ORAL
  Filled 2012-08-20 (×17): qty 1

## 2012-08-20 MED ORDER — ALLOPURINOL 100 MG PO TABS
100.0000 mg | ORAL_TABLET | Freq: Two times a day (BID) | ORAL | Status: DC
  Administered 2012-08-20 – 2012-08-27 (×14): 100 mg via ORAL
  Filled 2012-08-20 (×15): qty 1

## 2012-08-20 MED ORDER — ONDANSETRON HCL 4 MG/2ML IJ SOLN: 4.0000 mg | Freq: Four times a day (QID) | INTRAMUSCULAR | Status: DC | PRN

## 2012-08-20 MED ORDER — ALBUTEROL SULFATE (5 MG/ML) 0.5% IN NEBU: 2.5000 mg | INHALATION_SOLUTION | RESPIRATORY_TRACT | Status: DC | PRN

## 2012-08-20 MED ORDER — BIMATOPROST 0.01 % OP SOLN
1.0000 [drp] | Freq: Every day | OPHTHALMIC | Status: DC
  Administered 2012-08-20 – 2012-08-26 (×6): 1 [drp] via OPHTHALMIC
  Filled 2012-08-20: qty 2.5

## 2012-08-20 MED ORDER — SODIUM CHLORIDE 0.9 % IJ SOLN
3.0000 mL | Freq: Two times a day (BID) | INTRAMUSCULAR | Status: DC
  Administered 2012-08-20 – 2012-08-25 (×5): 3 mL via INTRAVENOUS

## 2012-08-20 MED ORDER — ASPIRIN EC 81 MG PO TBEC
81.0000 mg | DELAYED_RELEASE_TABLET | Freq: Every day | ORAL | Status: DC
  Administered 2012-08-20 – 2012-08-27 (×8): 81 mg via ORAL
  Filled 2012-08-20 (×8): qty 1

## 2012-08-20 MED ORDER — SODIUM CHLORIDE 0.9 % IJ SOLN: 3.0000 mL | INTRAMUSCULAR | Status: DC | PRN

## 2012-08-20 MED ORDER — GUAIFENESIN ER 600 MG PO TB12
600.0000 mg | ORAL_TABLET | Freq: Two times a day (BID) | ORAL | Status: DC
  Administered 2012-08-20 – 2012-08-27 (×13): 600 mg via ORAL
  Filled 2012-08-20 (×15): qty 1

## 2012-08-20 MED ORDER — CALCIUM CARBONATE-VITAMIN D 500-200 MG-UNIT PO TABS
1.0000 | ORAL_TABLET | Freq: Two times a day (BID) | ORAL | Status: DC
  Administered 2012-08-20 – 2012-08-27 (×13): 1 via ORAL
  Filled 2012-08-20 (×15): qty 1

## 2012-08-20 MED ORDER — SODIUM CHLORIDE 0.9 % IV SOLN
INTRAVENOUS | Status: DC
  Administered 2012-08-20: 15:00:00 via INTRAVENOUS

## 2012-08-20 MED ORDER — LIDOCAINE 5 % EX PTCH
1.0000 | MEDICATED_PATCH | CUTANEOUS | Status: DC
  Filled 2012-08-20 (×8): qty 1

## 2012-08-20 NOTE — ED Notes (Signed)
Pt drinking coffee without problem with permission from MD. Pt states he feels good except when his leg is touched

## 2012-08-20 NOTE — ED Notes (Signed)
Patient transported to X-ray 

## 2012-08-20 NOTE — ED Provider Notes (Signed)
History     CSN: [id number]  Arrival date & time 08/20/12  1228   First MD Initiated Contact with Patient 08/20/12 1303      Chief Complaint  Patient presents with  . Weakness  . Dizziness    (Consider location/radiation/quality/duration/timing/severity/associated sxs/prior treatment) HPI Comments: Patient is an 78 year old man who lives at home, and has 2 caregivers who come in the home and take care of him during the day. He says that he had a fall last Thursday or Friday, for 5 days ago, and hurt his right leg. Since then he said severe pain in his right calf and has been very painful for him to walk. Also, he says he is shaking spells for 5 times per day. He therefore came by EMS for evaluation. Reviewed his old chart shows prior falls, most recently in January of 2013 when he had fallen and fractured ribs 7 through 10 on the right; there is also a history of mild dementia.  Patient is a 78 y.o. male presenting with weakness. The history is provided by the patient and medical records. No language interpreter was used.  Weakness Primary symptoms do not include fever. The symptoms began 3 to 5 days ago. The symptoms are worsening. The neurological symptoms are diffuse (He has generalized weakness, and focal pain in his right calf.).  Additional symptoms include weakness and lower back pain.    Past Medical History  Diagnosis Date  . Coronary artery disease     No past surgical history on file.  No family history on file.  History  Substance Use Topics  . Smoking status: Never Smoker   . Smokeless tobacco: Not on file  . Alcohol Use: No      Review of Systems  Constitutional: Negative for fever and chills.  HENT: Negative.   Eyes: Negative.   Respiratory: Negative.   Cardiovascular: Negative.   Gastrointestinal: Negative.   Genitourinary: Negative.   Musculoskeletal:       Pain in right calf, low back pain.  Skin: Negative.   Neurological: Positive for weakness.   Psychiatric/Behavioral: Negative.     Allergies  Penicillins and Percocet  Home Medications   Current Outpatient Rx  Name Route Sig Dispense Refill  . ACETAMINOPHEN 325 MG PO TABS Oral Take 2 tablets (650 mg total) by mouth every 6 (six) hours as needed (or Fever >/= 101). 60 tablet 1  . ALBUTEROL SULFATE (2.5 MG/3ML) 0.083% IN NEBU Nebulization Take 2.5 mg by nebulization every 4 (four) hours as needed. For shortness of breath    . ALLOPURINOL 100 MG PO TABS Oral Take 100 mg by mouth 2 (two) times daily.    Marland Kitchen BICALUTAMIDE 50 MG PO TABS Oral Take 50 mg by mouth daily.    Marland Kitchen CILOSTAZOL 50 MG PO TABS Oral Take 50 mg by mouth 2 (two) times daily.    Marland Kitchen CITALOPRAM HYDROBROMIDE 20 MG PO TABS Oral Take 20 mg by mouth daily.    . DONEPEZIL HCL 10 MG PO TABS Oral Take 10 mg by mouth at bedtime.    . DULOXETINE HCL 30 MG PO CPEP Oral Take 30 mg by mouth 2 (two) times daily.    Marland Kitchen EZETIMIBE 10 MG PO TABS Oral Take 10 mg by mouth daily.    Marland Kitchen MEMANTINE HCL 10 MG PO TABS Oral Take 10 mg by mouth 2 (two) times daily.    Marland Kitchen METOPROLOL TARTRATE 50 MG PO TABS Oral Take 25 mg by mouth 2 (  two) times daily.    Marland Kitchen POTASSIUM CHLORIDE CRYS ER 20 MEQ PO TBCR Oral Take 20 mEq by mouth daily.    Bernadette Hoit SODIUM 8.6-50 MG PO TABS Oral Take 1 tablet by mouth 2 (two) times daily. 60 tablet 1  . TAMSULOSIN HCL 0.4 MG PO CAPS Oral Take 0.4 mg by mouth at bedtime.    . TORSEMIDE 20 MG PO TABS Oral Take 20 mg by mouth See admin instructions. Take 1 tablet on Monday, Wednesday, and Friday    . TRAMADOL-ACETAMINOPHEN 37.5-325 MG PO TABS Oral Take 1 tablet by mouth every 6 (six) hours as needed. For pain      BP 138/57  Pulse 85  Temp 98 F (36.7 C) (Oral)  Resp 15  SpO2 96%  Physical Exam  Nursing note and vitals reviewed. Constitutional: He is oriented to person, place, and time.       Frail-appearing elderly man, garrulous.  HENT:  Head: Normocephalic and atraumatic.  Right Ear: External ear  normal.  Left Ear: External ear normal.  Mouth/Throat: Oropharynx is clear and moist.  Eyes: Conjunctivae normal and EOM are normal. Pupils are equal, round, and reactive to light.  Neck: Normal range of motion. Neck supple.  Cardiovascular: Normal rate, regular rhythm and normal heart sounds.   Pulmonary/Chest: Effort normal and breath sounds normal.  Abdominal: Bowel sounds are normal.  Musculoskeletal:       Pain is localized to the right calf. He has mild tenderness over the mid shaft of the right calf overlying the fibula. There is no palpable bony deformity.  Neurological: He is alert and oriented to person, place, and time.       No sensory or motor deficit.  Skin: Skin is warm and dry.  Psychiatric: He has a normal mood and affect. His behavior is normal.    ED Course  Procedures (including critical care time)   Date: 08/20/2012  Rate:88  Rhythm: normal sinus rhythm  QRS Axis: left  Intervals: normal  ST/T Wave abnormalities: normal  Conduction Disutrbances:right bundle branch block  Narrative Interpretation: Abnormal EKG  Old EKG Reviewed: changes noted--had sinus tachycardia of 121 on tracing of 07/17/2010.  1:48 PM Pt seen --> physical exam performed.  Lab workup and x-rays ordered.  PO hydrocodone-acetaminophen ordered.  Old charts reviewed.  5:12 PM Results for orders placed during the hospital encounter of 08/20/12  CBC WITH DIFFERENTIAL      Component Value Range   WBC 10.6 (*) 4.0 - 10.5 K/uL   RBC 3.94 (*) 4.22 - 5.81 MIL/uL   Hemoglobin 11.3 (*) 13.0 - 17.0 g/dL   HCT 16.1 (*) 09.6 - 04.5 %   MCV 86.3  78.0 - 100.0 fL   MCH 28.7  26.0 - 34.0 pg   MCHC 33.2  30.0 - 36.0 g/dL   RDW 40.9  81.1 - 91.4 %   Platelets 124 (*) 150 - 400 K/uL   Neutrophils Relative 70  43 - 77 %   Neutro Abs 7.4  1.7 - 7.7 K/uL   Lymphocytes Relative 17  12 - 46 %   Lymphs Abs 1.8  0.7 - 4.0 K/uL   Monocytes Relative 8  3 - 12 %   Monocytes Absolute 0.9  0.1 - 1.0 K/uL    Eosinophils Relative 5  0 - 5 %   Eosinophils Absolute 0.6  0.0 - 0.7 K/uL   Basophils Relative 0  0 - 1 %   Basophils Absolute 0.0  0.0 - 0.1 K/uL  COMPREHENSIVE METABOLIC PANEL      Component Value Range   Sodium 135  135 - 145 mEq/L   Potassium 4.7  3.5 - 5.1 mEq/L   Chloride 100  96 - 112 mEq/L   CO2 23  19 - 32 mEq/L   Glucose, Bld 102 (*) 70 - 99 mg/dL   BUN 37 (*) 6 - 23 mg/dL   Creatinine, Ser 4.09 (*) 0.50 - 1.35 mg/dL   Calcium 9.0  8.4 - 81.1 mg/dL   Total Protein 6.4  6.0 - 8.3 g/dL   Albumin 3.4 (*) 3.5 - 5.2 g/dL   AST 23  0 - 37 U/L   ALT 10  0 - 53 U/L   Alkaline Phosphatase 97  39 - 117 U/L   Total Bilirubin 0.4  0.3 - 1.2 mg/dL   GFR calc non Af Amer 39 (*) >90 mL/min   GFR calc Af Amer 46 (*) >90 mL/min  PROTIME-INR      Component Value Range   Prothrombin Time 14.1  11.6 - 15.2 seconds   INR 1.10  0.00 - 1.49  APTT      Component Value Range   aPTT 36  24 - 37 seconds   Dg Chest 2 View  08/20/2012  *RADIOLOGY REPORT*  Clinical Data: Weakness and dizziness.  Recent fall  CHEST - 2 VIEW  Comparison: 11/12/2011  Findings: Prior CABG.  Heart size is normal and there is no heart failure.  Prominent lung markings suggestive of COPD.  Negative for mass or pneumonia.  Moderate compression fracture approximately T9 which is not present on the study of 11/01/2010.  This is indeterminate in age.  IMPRESSION: COPD.  No acute cardiopulmonary disease.  Moderate compression fracture of approximately T9 of indeterminate age.   Original Report Authenticated By: Camelia Phenes, M.D.    Dg Tibia/fibula Right  08/20/2012  *RADIOLOGY REPORT*  Clinical Data: Fall  RIGHT TIBIA AND FIBULA - 2 VIEW  Comparison: None.  Findings: Negative for fracture.  There is arterial calcification and multiple vascular clips in the soft tissues.  IMPRESSION: Negative for fracture.   Original Report Authenticated By: Camelia Phenes, M.D.     Chest x-ray shows S/P CABG, COPD.  X-ray of right tibia  and fibula showed no fracture.  Lab tests show some renal insufficiency.  Call to Dr. Isidor Holts.  Plan to admit to a telemetry bed for fall.  1. Darlin Coco III, MD 08/20/12 [phone]

## 2012-08-20 NOTE — ED Notes (Signed)
Pt c/o dizziness and weakness ongoing for a while, worse today. Pt alert and oriented x 4 on arrival. Denies any pain or shob on arrival

## 2012-08-20 NOTE — ED Notes (Signed)
Pt returned from xray. Caretaker remains at bedside.  Lab at bedside for blood draw

## 2012-08-20 NOTE — Progress Notes (Signed)
MEDICATION RELATED NOTE   Pharmacy Re:  Hydrocodone/Acetaminophen Indication: Pain    Medications: (Pain) Prescriptions prior to admission  Medication Sig Dispense Refill  . diazepam (VALIUM) 10 MG tablet Take 10 mg by mouth at bedtime.      . diclofenac sodium (VOLTAREN) 1 % GEL Apply 1 application topically 2 (two) times daily.      . Hydrocodone-Acetaminophen 10-660 MG TABS Take 1 tablet by mouth 4 (four) times daily as needed. For pain      . lidocaine (LIDODERM) 5 % Place 1 patch onto the skin daily. Remove & Discard patch within 12 hours or as directed by MD      . naproxen sodium (ANAPROX) 220 MG tablet Take 220 mg by mouth every 12 (twelve) hours.      . traMADol-acetaminophen (ULTRACET) 37.5-325 MG per tablet Take 2 tablets by mouth 2 (two) times daily as needed. For pain        Assessment: 78yo male admitted with leg pain and unable to ambulate.  He has multiple medical problems and receives several pain medications at home as needed (see above).  I spoke with Dr. Brien Few and patient is complaining of severe pain currently.  He has escalated his Hydrocodone/Acetaminophen to a scheduled regimen and prefers to omit specific parameters for monitoring at the present time.  He will re-assess the need for this on an ongoing basis.    Plan:  Continue current medication regimen per Dr. Alison Murray orders. F/U therapeutic response and suggest.  Nadara Mustard, PharmD., MS Clinical Pharmacist Pager:  [phone] Thank you for allowing pharmacy to be part of this patients care team.  08/20/2012,7:39 PM

## 2012-08-20 NOTE — H&P (Signed)
PCP:   GATES,ROBERT NEVILL, MD   Chief Complaint:  Recurrent falls, right leg pain, unable to ambulate.   HPI: This is an 78 year old male, with known history of HTN, CAD s/p CAB/AVR 2004, prostate cancer s/p radical prostatectomy with external radiation treatment in 1988, s/p bilateral orchectomy and removal of penile prosthesis with circumcision 07/2000, depression/anxiety, OSA, gout, COPD, hypothyroidism, postoperative atrial fibrillation, dementia, carotid artery stenosis, s/p right carotid endarterectomy in 1994, s/p umbilical hernia repair, chronic back pain/ right radiculopathy, multiple falls, presenting follow in yet another fall on 08/15/12. Patient resides alone at home, but has a caregiver come in from 11:00 AM-7:00 PM daily, and usually goes to bed at about 9:00 PM. On that particular day, he had gone to the kitchen to get a cookie. Precise circumstances of fall are vague, but he confirms that it was a mechanical fall, without antecedent chest pain, palpitations or dizziness. He has been unable to ambulate since, due to right leg pain.    Allergies:   Allergies  Allergen Reactions  . Penicillins     REACTION: rash  . Percocet (Oxycodone-Acetaminophen) Other (See Comments)    delusional      Past Medical History  Diagnosis Date  . Coronary artery disease     No past surgical history on file.  Prior to Admission medications   Medication Sig Start Date End Date Taking? Authorizing Provider  albuterol (PROVENTIL) (2.5 MG/3ML) 0.083% nebulizer solution Take 2.5 mg by nebulization every 4 (four) hours as needed. For shortness of breath   Yes Historical Provider, MD  allopurinol (ZYLOPRIM) 100 MG tablet Take 100 mg by mouth 2 (two) times daily.   Yes Historical Provider, MD  aspirin EC 81 MG tablet Take 81 mg by mouth daily.   Yes Historical Provider, MD  bicalutamide (CASODEX) 50 MG tablet Take 50 mg by mouth daily.   Yes Historical Provider, MD  bimatoprost (LUMIGAN) 0.01 %  SOLN Place 1 drop into both eyes at bedtime.   Yes Historical Provider, MD  calcium-vitamin D (OSCAL WITH D) 500-200 MG-UNIT per tablet Take 1 tablet by mouth 2 (two) times daily.   Yes Historical Provider, MD  cilostazol (PLETAL) 50 MG tablet Take 50 mg by mouth 2 (two) times daily.   Yes Historical Provider, MD  diazepam (VALIUM) 10 MG tablet Take 10 mg by mouth at bedtime.   Yes Historical Provider, MD  diclofenac sodium (VOLTAREN) 1 % GEL Apply 1 application topically 2 (two) times daily.   Yes Historical Provider, MD  donepezil (ARICEPT) 10 MG tablet Take 10 mg by mouth at bedtime.   Yes Historical Provider, MD  DULoxetine (CYMBALTA) 30 MG capsule Take 30 mg by mouth 2 (two) times daily.   Yes Historical Provider, MD  guaiFENesin (MUCINEX) 600 MG 12 hr tablet Take 600 mg by mouth every 12 (twelve) hours as needed. For cough/congestion   Yes Historical Provider, MD  Hydrocodone-Acetaminophen 10-660 MG TABS Take 1 tablet by mouth 4 (four) times daily as needed. For pain   Yes Historical Provider, MD  lidocaine (LIDODERM) 5 % Place 1 patch onto the skin daily. Remove & Discard patch within 12 hours or as directed by MD   Yes Historical Provider, MD  memantine (NAMENDA) 10 MG tablet Take 10 mg by mouth 2 (two) times daily.   Yes Historical Provider, MD  metoprolol succinate (TOPROL-XL) 25 MG 24 hr tablet Take 25 mg by mouth daily.   Yes Historical Provider, MD  naproxen  sodium (ANAPROX) 220 MG tablet Take 220 mg by mouth every 12 (twelve) hours.   Yes Historical Provider, MD  potassium chloride SA (K-DUR,KLOR-CON) 20 MEQ tablet Take 20 mEq by mouth daily.   Yes Historical Provider, MD  Tamsulosin HCl (FLOMAX) 0.4 MG CAPS Take 0.4 mg by mouth at bedtime.   Yes Historical Provider, MD  traMADol-acetaminophen (ULTRACET) 37.5-325 MG per tablet Take 2 tablets by mouth 2 (two) times daily as needed. 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 He quit  in 1994. He does not have any smokeless tobacco history on file. He reports that he does not drink alcohol or use illicit drugs. Married twice. Has offspring.   Family History:  Mother died at age 63 from uremia. Father died at age 10. Four  Sisters, one sister with dementia and arthritis, two sisters had breast cancer. One brother died at age 24 of unknown cause.   Review of Systems:  As per HPI and chief complaint. Patent denies fatigue, diminished appetite, weight loss, fever, chills, headache, blurred vision, difficulty in speaking, dysphagia, chest pain, cough, he does have chronic shortness of breath which he attributes to COPD, and quit using his CPAP about 3-5 years ago. Denies orthopnea, paroxysmal nocturnal dyspnea, nausea, diaphoresis, abdominal pain, vomiting, diarrhea, belching, heartburn, hematemesis, melena, dysuria, nocturia, urinary frequency, hematochezia, lower extremity swelling, or redness. Patient was treated for a possible conjunctivitis about 2 weeks ago, now resolved. The rest of the systems review is negative.  Physical Exam:  General:  Patient does not appear to be in obvious acute distress. Alert, communicative, fully oriented, talking in complete sentences, not short of breath at rest.  HEENT:  Mild clinical pallor, no jaundice, no conjunctival injection or discharge. NECK:  Supple, JVP not seen, no carotid bruits, no palpable lymphadenopathy, no palpable goiter. CHEST:  Clinically clear to auscultation, no wheezes, no crackles. HEART:  Sounds 1 and 2 heard, normal, regular, no murmurs. ABDOMEN:  Full, soft, non-tender, no palpable organomegaly, no palpable masses, normal bowel sounds. GENITALIA:  Not examined. LOWER EXTREMITIES:  No pitting edema, palpable peripheral pulses. No obvious bruising or tenderness to palpation of both LE.  MUSCULOSKELETAL SYSTEM:  Generalized osteoarthritic  changes, otherwise, positive straight-leg raising test on right.  CENTRAL NERVOUS SYSTEM:  No focal neurologic deficit on gross examination.  Labs on Admission:  Results for orders placed during the hospital encounter of 08/20/12 (from the past 48 hour(s))  CBC WITH DIFFERENTIAL     Status: Abnormal   Collection Time   08/20/12  2:28 PM      Component Value Range Comment   WBC 10.6 (*) 4.0 - 10.5 K/uL    RBC 3.94 (*) 4.22 - 5.81 MIL/uL    Hemoglobin 11.3 (*) 13.0 - 17.0 g/dL    HCT 32.4 (*) 40.1 - 52.0 %    MCV 86.3  78.0 - 100.0 fL    MCH 28.7  26.0 - 34.0 pg    MCHC 33.2  30.0 - 36.0 g/dL    RDW 02.7  25.3 - 66.4 %    Platelets 124 (*) 150 - 400 K/uL    Neutrophils Relative 70  43 - 77 %    Neutro Abs 7.4  1.7 - 7.7 K/uL    Lymphocytes Relative 17  12 - 46 %  Lymphs Abs 1.8  0.7 - 4.0 K/uL    Monocytes Relative 8  3 - 12 %    Monocytes Absolute 0.9  0.1 - 1.0 K/uL    Eosinophils Relative 5  0 - 5 %    Eosinophils Absolute 0.6  0.0 - 0.7 K/uL    Basophils Relative 0  0 - 1 %    Basophils Absolute 0.0  0.0 - 0.1 K/uL   COMPREHENSIVE METABOLIC PANEL     Status: Abnormal   Collection Time   08/20/12  2:28 PM      Component Value Range Comment   Sodium 135  135 - 145 mEq/L    Potassium 4.7  3.5 - 5.1 mEq/L    Chloride 100  96 - 112 mEq/L    CO2 23  19 - 32 mEq/L    Glucose, Bld 102 (*) 70 - 99 mg/dL    BUN 37 (*) 6 - 23 mg/dL    Creatinine, Ser 1.61 (*) 0.50 - 1.35 mg/dL    Calcium 9.0  8.4 - 09.6 mg/dL    Total Protein 6.4  6.0 - 8.3 g/dL    Albumin 3.4 (*) 3.5 - 5.2 g/dL    AST 23  0 - 37 U/L    ALT 10  0 - 53 U/L    Alkaline Phosphatase 97  39 - 117 U/L    Total Bilirubin 0.4  0.3 - 1.2 mg/dL    GFR calc non Af Amer 39 (*) >90 mL/min    GFR calc Af Amer 46 (*) >90 mL/min   PROTIME-INR     Status: Normal   Collection Time   08/20/12  2:28 PM      Component Value Range Comment   Prothrombin Time 14.1  11.6 - 15.2 seconds    INR 1.10  0.00 - 1.49   APTT      Status: Normal   Collection Time   08/20/12  2:28 PM      Component Value Range Comment   aPTT 36  24 - 37 seconds     Radiological Exams on Admission: *RADIOLOGY REPORT*  Clinical Data: Fall  RIGHT TIBIA AND FIBULA - 2 VIEW  Comparison: None.  Findings: Negative for fracture. There is arterial calcification and multiple vascular clips in the soft tissues.  IMPRESSION: Negative for fracture.   Original Report Authenticated By: Camelia Phenes, M.D.  *RADIOLOGY REPORT*  Clinical Data: Weakness and dizziness. Recent fall  CHEST - 2 VIEW  Comparison: 11/12/2011  Findings: Prior CABG. Heart size is normal and there is no heart failure. Prominent lung markings suggestive of COPD. Negative for mass or pneumonia.  Moderate compression fracture approximately T9 which is not present on the study of 11/01/2010. This is indeterminate in age.  IMPRESSION: COPD. No acute cardiopulmonary disease.  Moderate compression fracture of approximately T9 of indeterminate age.       Assessment/Plan Active Problems:  1. Fall. Patient has a known history of multiple falls, which may be multi-factorial. On this occasion, he appeared to have sustained a mechanical fall. Fortunately, there were no obvious bony injuries.  2. Back pain/right LE Radiculopathy: Patient has a known history of chronic low back pain, and ambulates with a walker. Following his fall on 08/15/12, he has been unable to ambulate, and he does have a positive straight-leg raising test on the right. We shall manage with analgesics, muscle relaxants, physiotherapy, and arrange lumbo-sacral MRI, to evaluate anatomy, and rule out compression fracture.  3. HTN (hypertension): BP is controlled at this time 4. CAD (coronary artery disease): Patient is s/p CABG and tissue AVR in 2004. He has no symptoms suggestive of CAD, at this time. 12-lead EKG shows SR/RBBB, but no acute ischemic changes.  5. Dehydration/Acute kidney  injury: Patient's creatinine is somewhat elevated at 1.53, BUN 37,against a known baseline creat of 1.12 on 11/13/11. We shall manage with gentle iv fluids, and discontinue nephrotoxins. Following renal indices.  6. OSA: Not currently on CPAP. Patient claims that he quit using this about 3-5 years ago. Perhaps, rounding MD can persuade him to restart. 7. Dementia: Stable. Will continue pre-admission psychotropics.   8. COPD: Stable. Will manage with albuterol nebs and Spiriva handihaler.   Further management will depend on clinical course.   Comment: I did discuss code status with patient's daughter/HPOA, Milas Kocher, who was present at the bedside (Tel: (H) [phone]; (C) [phone]). She has confirmed that patient is DNR/DNI.   Time Spent on Admission: 40 mins.   Aaiden Depoy,CHRISTOPHER 08/20/2012, 6:52 PM

## 2012-08-20 NOTE — ED Notes (Signed)
Med given for c/o right lower leg pain (lateral) states he bumped it yesterday when he fell. Rates pain at a 8 when area is touched.  Caregiver at bedside

## 2012-08-20 NOTE — ED Notes (Signed)
Pt requesting something to eat.  [place] sandwich given to pt. And plan of care explained to pt and family member.

## 2012-08-21 ENCOUNTER — Inpatient Hospital Stay (HOSPITAL_COMMUNITY): Payer: PRIVATE HEALTH INSURANCE

## 2012-08-21 LAB — CBC
HCT: 31.1 % — ABNORMAL LOW (ref 39.0–52.0)
Hemoglobin: 10.3 g/dL — ABNORMAL LOW (ref 13.0–17.0)
MCH: 28.5 pg (ref 26.0–34.0)
MCHC: 33.1 g/dL (ref 30.0–36.0)
RDW: 14.4 % (ref 11.5–15.5)

## 2012-08-21 LAB — BASIC METABOLIC PANEL
BUN: 26 mg/dL — ABNORMAL HIGH (ref 6–23)
Calcium: 9 mg/dL (ref 8.4–10.5)
Creatinine, Ser: 1.23 mg/dL (ref 0.50–1.35)
GFR calc Af Amer: 59 mL/min — ABNORMAL LOW (ref >90)
GFR calc non Af Amer: 51 mL/min — ABNORMAL LOW (ref >90)
Glucose, Bld: 111 mg/dL — ABNORMAL HIGH (ref 70–99)
Potassium: 3.7 mEq/L (ref 3.5–5.1)

## 2012-08-21 LAB — URINE MICROSCOPIC-ADD ON

## 2012-08-21 LAB — URINALYSIS, ROUTINE W REFLEX MICROSCOPIC
Bilirubin Urine: NEGATIVE
Glucose, UA: NEGATIVE mg/dL
Hgb urine dipstick: NEGATIVE
Specific Gravity, Urine: 1.015 (ref 1.005–1.030)
pH: 6 (ref 5.0–8.0)

## 2012-08-21 MED ORDER — GADOBENATE DIMEGLUMINE 529 MG/ML IV SOLN
18.0000 mL | Freq: Once | INTRAVENOUS | Status: AC
  Administered 2012-08-21: 18 mL via INTRAVENOUS

## 2012-08-21 MED ORDER — HYDROCODONE-ACETAMINOPHEN 10-325 MG PO TABS
1.0000 | ORAL_TABLET | Freq: Four times a day (QID) | ORAL | Status: DC
  Administered 2012-08-21 – 2012-08-26 (×8): 1 via ORAL
  Filled 2012-08-21 (×10): qty 1

## 2012-08-21 NOTE — Progress Notes (Signed)
PT Cancellation Note  Patient Details Name: Logan Allen MRN: [id number] DOB: [birth year]   Cancelled Treatment:    Reason Eval/Treat Not Completed: Medical issues which prohibited therapy (await clearance with MRI results back) Pt with T12 and Lx fx noted on MRI and need clearance post results to initiate eval.  Thanks Delaney Meigs, PT [phone]

## 2012-08-21 NOTE — Progress Notes (Signed)
Subjective: Patient not currently in room and could not examine and interview this morning.  He is [facility] receiving an MRI of the lumbar spine.  Laboratories including CBC basic metabolic profile and LFTs are essentially normal  Objective: Weight change:   Intake/Output Summary (Last 24 hours) at 08/21/12 0817 Last data filed at 08/21/12 0600  Gross per 24 hour  Intake 338.33 ml  Output    300 ml  Net  38.33 ml   Filed Vitals:   08/20/12 1907 08/20/12 1951 08/20/12 2009 08/21/12 0455  BP: 153/73 144/75  114/62  Pulse: 83 85 83 94  Temp: 98.6 F (37 C) 97.6 F (36.4 C)  98.1 F (36.7 C)  TempSrc: Oral Oral  Oral  Resp: 18 16 16 20   Height:  5\' 9"  (1.753 m)    Weight:  79.1 kg (174 lb 6.1 oz)    SpO2: 95% 93% 94% 93%    Lab Results:  Basename 08/21/12 0540 08/20/12 1939 08/20/12 1428  NA 140 -- 135  K 3.7 -- 4.7  CL 105 -- 100  CO2 29 -- 23  GLUCOSE 111* -- 102*  BUN 26* -- 37*  CREATININE 1.23 1.40* --  CALCIUM 9.0 -- 9.0  MG -- -- --  PHOS -- -- --    Basename 08/20/12 1428  AST 23  ALT 10  ALKPHOS 97  BILITOT 0.4  PROT 6.4  ALBUMIN 3.4*   No results found for this basename: LIPASE:2,AMYLASE:2 in the last 72 hours  Basename 08/21/12 0540 08/20/12 1939 08/20/12 1428  WBC 8.2 8.6 --  NEUTROABS -- -- 7.4  HGB 10.3* 10.8* --  HCT 31.1* 33.0* --  MCV 85.9 85.7 --  PLT PLATELET CLUMPING, SUGGEST RECOLLECTION OF SAMPLE IN CITRATE TUBE. PLATELETS APPEAR ADEQUATE --   No results found for this basename: CKTOTAL:3,CKMB:3,CKMBINDEX:3,TROPONINI:3 in the last 72 hours No components found with this basename: POCBNP:3 No results found for this basename: DDIMER:2 in the last 72 hours No results found for this basename: HGBA1C:2 in the last 72 hours No results found for this basename: CHOL:2,HDL:2,LDLCALC:2,TRIG:2,CHOLHDL:2,LDLDIRECT:2 in the last 72 hours No results found for this basename: TSH,T4TOTAL,FREET3,T3FREE,THYROIDAB in the last 72 hours No  results found for this basename: VITAMINB12:2,FOLATE:2,FERRITIN:2,TIBC:2,IRON:2,RETICCTPCT:2 in the last 72 hours  Studies/Results: Dg Chest 2 View  08/20/2012  *RADIOLOGY REPORT*  Clinical Data: Weakness and dizziness.  Recent fall  CHEST - 2 VIEW  Comparison: 11/12/2011  Findings: Prior CABG.  Heart size is normal and there is no heart failure.  Prominent lung markings suggestive of COPD.  Negative for mass or pneumonia.  Moderate compression fracture approximately T9 which is not present on the study of 11/01/2010.  This is indeterminate in age.  IMPRESSION: COPD.  No acute cardiopulmonary disease.  Moderate compression fracture of approximately T9 of indeterminate age.   Original Report Authenticated By: Camelia Phenes, M.D.    Dg Tibia/fibula Right  08/20/2012  *RADIOLOGY REPORT*  Clinical Data: Fall  RIGHT TIBIA AND FIBULA - 2 VIEW  Comparison: None.  Findings: Negative for fracture.  There is arterial calcification and multiple vascular clips in the soft tissues.  IMPRESSION: Negative for fracture.   Original Report Authenticated By: Camelia Phenes, M.D.    Medications: Scheduled Meds:   . albuterol  2.5 mg Nebulization TID  . allopurinol  100 mg Oral BID  . aspirin EC  81 mg Oral Daily  . bicalutamide  50 mg Oral Daily  . bimatoprost  1 drop Both  Eyes QHS  . calcium-vitamin D  1 tablet Oral BID  . cilostazol  50 mg Oral BID  . cyclobenzaprine  5 mg Oral TID  . diazepam  10 mg Oral QHS  . donepezil  10 mg Oral QHS  . DULoxetine  30 mg Oral BID  . guaiFENesin  600 mg Oral BID  . heparin  5,000 Units Subcutaneous Q8H  . HYDROcodone-acetaminophen  1 tablet Oral QID  . HYDROcodone-acetaminophen  1 tablet Oral Once  . influenza  inactive virus vaccine  0.5 mL Intramuscular Tomorrow-1000  . lidocaine  1 patch Transdermal Q24H  . memantine  10 mg Oral BID  . metoprolol succinate  25 mg Oral Daily  . sodium chloride  3 mL Intravenous Q12H  . sodium chloride  3 mL Intravenous Q12H  .  Tamsulosin HCl  0.4 mg Oral QHS  . tiotropium  18 mcg Inhalation Daily   Continuous Infusions:   . sodium chloride 50 mL/hr at 08/20/12 2314  . DISCONTD: sodium chloride 125 mL/hr at 08/20/12 1430   PRN Meds:.sodium chloride, albuterol, HYDROmorphone (DILAUDID) injection, ondansetron (ZOFRAN) IV, ondansetron, sodium chloride  Assessment/Plan:  Active Problems:  1. Fall. Patient has a known history of multiple falls, which may be multi-factorial. On this occasion, he appeared to have sustained a mechanical fall. Fortunately, there were no obvious bony injuries.  2. Back pain/right LE Radiculopathy: Patient has a known history of chronic low back pain, and ambulates with a walker. Following his fall on 08/15/12, he has been unable to ambulate.  Continue with analgesics, muscle relaxants, physiotherapy, and  lumbo-sacral MRI is being performed currently, to evaluate anatomy, and rule out compression fracture.  3. HTN (hypertension): BP is controlled at this time  4. CAD (coronary artery disease): Patient is s/p CABG and tissue AVR in 2004. He has no symptoms suggestive of CAD, at this time. 12-lead EKG shows SR/RBBB, but no acute ischemic changes.  5. Dehydration/Acute kidney injury: Patient's creatinine is somewhat elevated at 1.53, BUN 37,against a known baseline creat of 1.12 on 11/13/11.  Continue with gentle iv fluids, and discontinue nephrotoxins. Following renal indices.  6. OSA: Not currently on CPAP. Patient claims that he quit using this about 3-5 years ago. Perhaps, rounding MD can persuade him to restart.  7. Dementia: Stable. Will continue pre-admission psychotropics.  8. COPD: Stable. Will manage with albuterol nebs and Spiriva handihaler. 9.  Disposition - may need skilled nursing facility placement after discharge but also may be of to be discharged home with 24-hour care   LOS: 1 day   Marilou Barnfield NEVILL 08/21/2012, 8:17 AM

## 2012-08-21 NOTE — Progress Notes (Signed)
OT Cancellation Note  Patient Details Name: PANCHO RUSHING MRN: [id number] DOB: [birth year]   Cancelled Treatment:    Reason Eval/Treat Not Completed: Medical issues which prohibited therapy (MRI results showing T12 fx). Will hold OT eval until cleared by MD to be up with therapy.    08/21/2012 Cipriano Mile OTR/L Pager [phone] Office [phone]

## 2012-08-21 NOTE — Care Management Note (Signed)
    Page 1 of 2   08/27/2012     12:30:57 PM   CARE MANAGEMENT NOTE 08/27/2012  Patient:  Logan Allen, Logan Allen   Account Number:  [id number]  Date Initiated:  08/21/2012  Documentation initiated by:  Letha Cape  Subjective/Objective Assessment:   dx fall, confused  admit- lives alone.  Daughter is Myrene Buddy at [phone].     Action/Plan:   Anticipated DC Date:  08/27/2012   Anticipated DC Plan:  SKILLED NURSING FACILITY  In-house referral  Clinical Social Worker      DC Planning Services  CM consult      Choice offered to / List presented to:             Status of service:  Completed, signed off Medicare Important Message given?   (If response is "NO", the following Medicare IM given date fields will be blank) Date Medicare IM given:   Date Additional Medicare IM given:    Discharge Disposition:  SKILLED NURSING FACILITY  Per UR Regulation:  Reviewed for med. necessity/level of care/duration of stay  If discussed at Long Length of Stay Meetings, dates discussed:    Comments:  08/27/12 12:30 Letha Cape RN, BSN [phone] patient for kyphoplasty today and dc to snf, CSW following.  08/26/12 14:43 Letha Cape RN, BSN [phone] patient's ua is cloudy with large wbc's,proedure on hold, MD will reschedule as outpt and will dc pt in am to snf. CSW aware and RN aware.  08/22/12 17:52 Letha Cape RN, BSN [phone] received call from Dr. Jacky Kindle, he states that patient is observation status.  left message for Dr. Kevan Ny on call to give him the message , this will not affect patient going to snf because patient has [facility] insurance, and pt should be for dc hopefully this weekend.  I informed Lennox Laity the CSW of this information and will inform NCM for this weekend.  08/21/12 11:47 Letha Cape RN, BSN [phone] patient lives alone, he is confused.  CSW spoke with Myrene Buddy, patient's daughter states she wants pt to go to snf at dc.  CSW is facilitating the bed search.  Myrene Buddy states  patient has caregivers from 11 am to 7 pm every day privately between two ladies that swithc off weeks.

## 2012-08-21 NOTE — Progress Notes (Signed)
Nutrition Brief Note  Patient identified on the Malnutrition Screening Tool (MST) Report. Patient reports he lost weight during his SNF rehab stay, however, his weight has been stable since his discharge from the facility. Patient's daughter reports he's had a good appetite.  Body mass index is 25.75 kg/(m^2). Pt meets criteria for Overweight based on current BMI.   Current diet order is Heart Healthy, patient is consuming approximately 75-100% of meals at this time. Labs and medications reviewed.   No nutrition interventions warranted at this time. If nutrition issues arise, please consult RD.   Kirkland Hun, RD, LDN Pager #: [phone] After-Hours Pager #: [phone]
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
## 2012-08-21 NOTE — Progress Notes (Signed)
Student did original assessment at 11 am. Assessment not saved. Entered for time charted, not time of assessment. Remediation done.

## 2012-08-21 NOTE — Progress Notes (Addendum)
Clinical Social Work Department CLINICAL SOCIAL WORK PLACEMENT NOTE 08/21/2012  Patient:  EMMANUEL, GRUENHAGEN  Account Number:  [id number] Admit date:  08/20/2012  Clinical Social Worker:  Unk Lightning, LCSW  Date/time:  08/21/2012 11:00 AM  Clinical Social Work is seeking post-discharge placement for this patient at the following level of care:   SKILLED NURSING   (*CSW will update this form in Epic as items are completed)   08/21/2012  Patient/family provided with [facility] Department of Clinical Social Work's list of facilities offering this level of care within the geographic area requested by the patient (or if unable, by the patient's family).  08/21/2012  Patient/family informed of their freedom to choose among providers that offer the needed level of care, that participate in Medicare, Medicaid or managed care program needed by the patient, have an available bed and are willing to accept the patient.  08/21/2012  Patient/family informed of [facility]' ownership interest in [facility], as well as of the fact that they are under no obligation to receive care at this facility.  PASARR submitted to EDS on existing # PASARR number received from EDS on   FL2 transmitted to all facilities in geographic area requested by pt/family on  08/21/2012 FL2 transmitted to all facilities within larger geographic area on   Patient informed that his/her managed care company has contracts with or will negotiate with  certain facilities, including the following:     Patient/family informed of bed offers received:  08/22/12 Patient chooses bed at  Physician recommends and patient chooses bed at    Patient to be transferred to  on   Patient to be transferred to facility by   The following physician request were entered in Epic:   Additional Comments:

## 2012-08-21 NOTE — Progress Notes (Signed)
Clinical Social Work  CSW received referral for possible placement. CSW reviewed chart which stated PT will evaluate at a later time. CSW attempted to meet with patient but patient was unable to participate in assessment due to being confused. CSW called patient's dtr and left a message with CSW contact information. CSW will continue to follow.  [place], [place] [phone]

## 2012-08-22 DIAGNOSIS — H109 Unspecified conjunctivitis: Secondary | ICD-10-CM | POA: Diagnosis present

## 2012-08-22 DIAGNOSIS — M79604 Pain in right leg: Secondary | ICD-10-CM | POA: Diagnosis present

## 2012-08-22 MED ORDER — TOBRAMYCIN-DEXAMETHASONE 0.3-0.1 % OP SUSP
2.0000 [drp] | Freq: Three times a day (TID) | OPHTHALMIC | Status: DC
  Administered 2012-08-22 – 2012-08-27 (×16): 2 [drp] via OPHTHALMIC
  Filled 2012-08-22 (×2): qty 2.5

## 2012-08-22 NOTE — Progress Notes (Signed)
PT Cancellation Note  Patient Details Name: MARVELL TAMER MRN: [id number] DOB: 1924-06-28   Cancelled Treatment:    Reason Eval/Treat Not Completed: Medical issues which prohibited therapy;Other (comment) (awaiting IR evaluation for possible kyphoplasty)   Ferman Hamming 08/22/2012, 10:26 AM Weldon Picking PT Acute Rehab Services [phone] Beeper [phone]

## 2012-08-22 NOTE — Progress Notes (Addendum)
Subjective: Logan Allen still complaining of severe mid back pain.  Also has pain over his right lateral leg but negative for fracture of fibula or tibia.  Likely muscle contusion.  Could be a radicular pain from degenerative changes in lumbar and thoracic spine.  Also complaining of itchy eyes with dried exudate  Objective: Weight change:   Intake/Output Summary (Last 24 hours) at 08/22/12 0825 Last data filed at 08/22/12 0545  Gross per 24 hour  Intake    240 ml  Output    502 ml  Net   -262 ml   Filed Vitals:   08/21/12 0952 08/21/12 1443 08/21/12 2156 08/22/12 0543  BP: 135/71 96/61 144/67 183/73  Pulse: 87 78 91 94  Temp: 97.6 F (36.4 C) 98.3 F (36.8 C) 97.6 F (36.4 C) 98.4 F (36.9 C)  TempSrc: Oral Oral Oral Oral  Resp: 20 20 20 18   Height:      Weight:      SpO2: 90% 91% 90% 91%   General Appearance: Alert, cooperative, complaining of mid back pain and lateral right leg pain, appears stated age Head: Normocephalic, without obvious abnormality, atraumatic.  Dry exudate around both eyes with mild erythema Neck: Supple, symmetrical Lungs: Clear to auscultation bilaterally, respirations unlabored Back:  Marked pain to mild percussion over the lower thoracic spine Heart: Regular rate and rhythm, S1 and S2 normal, no murmur, rub or gallop Abdomen: Soft, non-tender, bowel sounds active all four quadrants, no masses, no organomegaly Extremities: Extremities normal, no cyanosis or edema.  Exquisitely tender to palpation over the lateral mid right leg Pulses: 2+ and symmetric all extremities Skin: Skin color, texture, turgor normal, no rashes or lesions Neuro: CNII-XII intact.  Nonfocal exam  Lab Results:  Basename 08/21/12 0540 08/20/12 1939 08/20/12 1428  NA 140 -- 135  K 3.7 -- 4.7  CL 105 -- 100  CO2 29 -- 23  GLUCOSE 111* -- 102*  BUN 26* -- 37*  CREATININE 1.23 1.40* --  CALCIUM 9.0 -- 9.0  MG -- -- --  PHOS -- -- --    Basename 08/20/12 1428  AST 23  ALT  10  ALKPHOS 97  BILITOT 0.4  PROT 6.4  ALBUMIN 3.4*   No results found for this basename: LIPASE:2,AMYLASE:2 in the last 72 hours  Basename 08/21/12 0540 08/20/12 1939 08/20/12 1428  WBC 8.2 8.6 --  NEUTROABS -- -- 7.4  HGB 10.3* 10.8* --  HCT 31.1* 33.0* --  MCV 85.9 85.7 --  PLT PLATELET CLUMPING, SUGGEST RECOLLECTION OF SAMPLE IN CITRATE TUBE. PLATELETS APPEAR ADEQUATE --   No results found for this basename: CKTOTAL:3,CKMB:3,CKMBINDEX:3,TROPONINI:3 in the last 72 hours No components found with this basename: POCBNP:3 No results found for this basename: DDIMER:2 in the last 72 hours No results found for this basename: HGBA1C:2 in the last 72 hours No results found for this basename: CHOL:2,HDL:2,LDLCALC:2,TRIG:2,CHOLHDL:2,LDLDIRECT:2 in the last 72 hours No results found for this basename: TSH,T4TOTAL,FREET3,T3FREE,THYROIDAB in the last 72 hours No results found for this basename: VITAMINB12:2,FOLATE:2,FERRITIN:2,TIBC:2,IRON:2,RETICCTPCT:2 in the last 72 hours  Studies/Results: Dg Chest 2 View  08/20/2012  *RADIOLOGY REPORT*  Clinical Data: Weakness and dizziness.  Recent fall  CHEST - 2 VIEW  Comparison: 11/12/2011  Findings: Prior CABG.  Heart size is normal and there is no heart failure.  Prominent lung markings suggestive of COPD.  Negative for mass or pneumonia.  Moderate compression fracture approximately T9 which is not present on the study of 11/01/2010.  This is indeterminate  in age.  IMPRESSION: COPD.  No acute cardiopulmonary disease.  Moderate compression fracture of approximately T9 of indeterminate age.   Original Report Authenticated By: Camelia Phenes, M.D.    Dg Tibia/fibula Right  08/20/2012  *RADIOLOGY REPORT*  Clinical Data: Fall  RIGHT TIBIA AND FIBULA - 2 VIEW  Comparison: None.  Findings: Negative for fracture.  There is arterial calcification and multiple vascular clips in the soft tissues.  IMPRESSION: Negative for fracture.   Original Report Authenticated  By: Camelia Phenes, M.D.    Mr Lumbar Spine W Wo Contrast  08/21/2012  *RADIOLOGY REPORT*  Clinical Data: Fall.  Back pain.  MRI LUMBAR SPINE WITHOUT AND WITH CONTRAST  Technique:  Multiplanar and multiecho pulse sequences of the lumbar spine were obtained without and with intravenous contrast.  Contrast:  18 ml Multihance IV  Comparison: Lumbar radiographs 07/07/2010  Findings: Image quality degraded by patient motion.  Aorta iliac stent graft is noted.  Moderate to severe compression fracture of L1 has progressed since 2011.  There is mild superior endplate edema and enhancement suggesting there may be a recent component to the fracture.  There is retropulsion of bone into the canal causing mild stenosis but no compression of the conus medullaris.  Conus medullaris terminates at T12 and  has normal signal.  Fracture of the superior endplate of L3 appears chronic without bone marrow edema.  There is a small amount of enhancement of the superior endplate of L3 on the left posteriorly.  This may represent a Schmorl's node rather than a recent fracture.  There is a very large Schmorl's node in the inferior plate of L1 which shows surrounding fatty change and enhancement.  This does not appear to represent a fracture.  No evidence of metastatic disease.  T11-T12:  Mild spinal stenosis due to retropulsion of T12 into the canal.  T12-L1:  Disc degeneration and facet degeneration without significant spinal stenosis.  L1-2:  Mild spinal stenosis with spondylosis and facet hypertrophy.  L2-3:  Spondylosis and facet hypertrophy with mild spinal stenosis.  L3-4:  Disc degeneration and spondylosis.  Bilateral facet hypertrophy.  There is mild spinal stenosis and mild foraminal stenosis bilaterally.  L4-5:  Advanced disc degeneration.  There is spondylosis causing foraminal encroachment bilaterally.  Small 2 x 4 mm cystic structure along the right L5 nerve root may be a perineural cyst. This appears more anterior than a  synovial cyst of the facet would be expected.  L5-S1:  Mild disc degeneration and spondylosis.  IMPRESSION: Severe compression fracture T12.  There was a mild fracture in 2011 and this has progressed.  There may be a recent component to the fracture with mild bone marrow edema and enhancement present. Mild retropulsion of T12 into the spinal canal causing mild spinal stenosis.  Compression fracture of L3 appears chronic.  Small amount of enhancement in the L3 vertebral body left may be a Schmorl's node.  Large Schmorl's node at L1.  Multilevel spondylosis as described above.   Original Report Authenticated By: Camelia Phenes, M.D.    Medications: Scheduled Meds:   . albuterol  2.5 mg Nebulization TID  . allopurinol  100 mg Oral BID  . aspirin EC  81 mg Oral Daily  . bicalutamide  50 mg Oral Daily  . bimatoprost  1 drop Both Eyes QHS  . calcium-vitamin D  1 tablet Oral BID  . cilostazol  50 mg Oral BID  . cyclobenzaprine  5 mg Oral TID  .  diazepam  10 mg Oral QHS  . donepezil  10 mg Oral QHS  . DULoxetine  30 mg Oral BID  . gadobenate dimeglumine  18 mL Intravenous Once  . guaiFENesin  600 mg Oral BID  . heparin  5,000 Units Subcutaneous Q8H  . HYDROcodone-acetaminophen  1 tablet Oral Q6H  . influenza  inactive virus vaccine  0.5 mL Intramuscular Tomorrow-1000  . lidocaine  1 patch Transdermal Q24H  . memantine  10 mg Oral BID  . metoprolol succinate  25 mg Oral Daily  . sodium chloride  3 mL Intravenous Q12H  . sodium chloride  3 mL Intravenous Q12H  . Tamsulosin HCl  0.4 mg Oral QHS  . tiotropium  18 mcg Inhalation Daily  . tobramycin-dexamethasone  2 drop Both Eyes TID  . DISCONTD: HYDROcodone-acetaminophen  1 tablet Oral QID   Continuous Infusions:   . DISCONTD: sodium chloride 50 mL/hr at 08/20/12 2314   PRN Meds:.sodium chloride, albuterol, HYDROmorphone (DILAUDID) injection, ondansetron (ZOFRAN) IV, ondansetron, sodium chloride  Assessment/Plan:  Active Problems:  1.  Fall. Patient has a known history of multiple falls, which may be multi-factorial. On this occasion, he appeared to have sustained a mechanical fall.  Continue PT OT as tolerated.  May not be tolerated until further therapy for T12 compression fracture has been performed  2. Back pain/right LE Radiculopathy: Worsening of previous T12 compression fracture.  Interventional radiology consulted to see if kyphoplasty could be performed 3. HTN (hypertension): BP is controlled at this time  4. CAD (coronary artery disease): Patient is s/p CABG and tissue AVR in 2004. He has no symptoms suggestive of CAD, at this time. 12-lead EKG shows SR/RBBB, but no acute ischemic changes.  5. Dehydration/Acute kidney injury: Creatinine down to 1.23 from 1.40, improved with rehydration 6. OSA: Not currently on CPAP. Patient claims that he quit using this about 3-5 years ago. Perhaps, rounding MD can persuade him to restart  7. Dementia: Stable. Will continue pre-admission psychotropics.  8. COPD: Stable. Will manage with albuterol nebs and Spiriva handihaler.  9. Disposition - will almost certainly need skilled nursing facility placement after discharge or will need to be discharged home with 24-hour care 10.  Bilateral conjunctivitis - try TobraDex 3 times a day 11.  Right leg pain - likely secondary to contusion of muscle.  No fracture on x-ray   LOS: 2 days   Eveny Anastas NEVILL 08/22/2012, 8:25 AM

## 2012-08-22 NOTE — Progress Notes (Signed)
Clinical Social Work  CSW spoke with dtr regarding bed offers. The following SNFs offered a bed: [facility], [address], [address], [address], [address] [place], [place], [address], [place] and [facility]. Dtr will review offers and will contact CSW with any further questions. CSW will continue to follow to assist with dc planning.  [place], [place] [phone]

## 2012-08-22 NOTE — Progress Notes (Signed)
OT Cancellation Note  08/22/2012 Cipriano Mile OTR/L Pager [phone] Office [phone]

## 2012-08-23 DIAGNOSIS — R253 Fasciculation: Secondary | ICD-10-CM | POA: Diagnosis not present

## 2012-08-23 LAB — BASIC METABOLIC PANEL
Calcium: 9.1 mg/dL (ref 8.4–10.5)
Creatinine, Ser: 1.03 mg/dL (ref 0.50–1.35)
GFR calc non Af Amer: 64 mL/min — ABNORMAL LOW (ref >90)
Glucose, Bld: 144 mg/dL — ABNORMAL HIGH (ref 70–99)
Sodium: 136 mEq/L (ref 135–145)

## 2012-08-23 LAB — CBC WITH DIFFERENTIAL/PLATELET
Basophils Absolute: 0 10*3/uL (ref 0.0–0.1)
Eosinophils Absolute: 0.3 10*3/uL (ref 0.0–0.7)
Eosinophils Relative: 2 % (ref 0–5)
Lymphs Abs: 1.5 10*3/uL (ref 0.7–4.0)
MCH: 29 pg (ref 26.0–34.0)
MCV: 85.9 fL (ref 78.0–100.0)
Neutrophils Relative %: 76 % (ref 43–77)
Platelets: ADEQUATE 10*3/uL (ref 150–400)
RBC: 4.03 MIL/uL — ABNORMAL LOW (ref 4.22–5.81)
RDW: 14.4 % (ref 11.5–15.5)
WBC: 12.3 10*3/uL — ABNORMAL HIGH (ref 4.0–10.5)

## 2012-08-23 LAB — URINE CULTURE: Colony Count: >=100000

## 2012-08-23 LAB — MAGNESIUM: Magnesium: 2 mg/dL (ref 1.5–2.5)

## 2012-08-23 LAB — GLUCOSE, CAPILLARY: Glucose-Capillary: 116 mg/dL — ABNORMAL HIGH (ref 70–99)

## 2012-08-23 LAB — PHOSPHORUS: Phosphorus: 2.7 mg/dL (ref 2.3–4.6)

## 2012-08-23 MED ORDER — HEPARIN SODIUM (PORCINE) 5000 UNIT/ML IJ SOLN
5000.0000 [IU] | Freq: Two times a day (BID) | INTRAMUSCULAR | Status: DC
  Administered 2012-08-23 – 2012-08-25 (×5): 5000 [IU] via SUBCUTANEOUS
  Filled 2012-08-23 (×9): qty 1

## 2012-08-23 MED ORDER — DIAZEPAM 5 MG PO TABS: 5.0000 mg | ORAL_TABLET | ORAL | Status: DC

## 2012-08-23 MED ORDER — HYDROCODONE-ACETAMINOPHEN 10-325 MG PO TABS: 1.0000 | ORAL_TABLET | Freq: Four times a day (QID) | ORAL | Status: DC | PRN

## 2012-08-23 MED ORDER — DIAZEPAM 5 MG PO TABS
5.0000 mg | ORAL_TABLET | ORAL | Status: DC
  Administered 2012-08-24 – 2012-08-27 (×4): 5 mg via ORAL
  Filled 2012-08-23 (×4): qty 1

## 2012-08-23 MED ORDER — DIAZEPAM 5 MG PO TABS
10.0000 mg | ORAL_TABLET | Freq: Every day | ORAL | Status: DC
  Administered 2012-08-23 – 2012-08-26 (×4): 10 mg via ORAL
  Filled 2012-08-23 (×2): qty 1
  Filled 2012-08-23 (×3): qty 2

## 2012-08-23 NOTE — Progress Notes (Addendum)
Subjective: Logan Allen is still complaining of back pain but is also having generalized fasciculations.  Apparently has had these for several weeks but much worse today and apparently had been somewhat progressive.  Has chronically been on Valium for many years 10 milligrams at bedtime and is continuing to receive it here at [facility].  Patient is conversive but seems to be more confused than usual.  No fevers or chills.  Does complain of back pain.  Not complaining of right leg pain today and had negative x-ray for fracture of the fibula or tibia yesterday.  Likely a muscle contusion improving.  Am 4 waiting interventional radiology consult to help control back pain  Objective: Weight change:   Intake/Output Summary (Last 24 hours) at 08/23/12 1214 Last data filed at 08/22/12 2051  Gross per 24 hour  Intake    120 ml  Output      0 ml  Net    120 ml   Filed Vitals:   08/22/12 1430 08/22/12 2049 08/23/12 0527 08/23/12 0741  BP:  98/57 153/67   Pulse:  95 105   Temp:  99.1 F (37.3 C) 98.9 F (37.2 C)   TempSrc:  Oral Oral   Resp:  20 20   Height:      Weight:      SpO2: 93% 93% 94% 90%   General Appearance: Patient seems intermittently confused but is conversive.  Having generalized fasciculations.  Apparently have been present for about a month on history from daughter and caregiver but seemed to be much worse today.  Seems to fall asleep periodically Head: Normocephalic, without obvious abnormality, atraumatic. Dry exudate around both eyes with mild erythema, improved  Neck: Supple, symmetrical  Lungs: Clear to auscultation bilaterally, respirations unlabored  Back: Marked pain to mild percussion over the lower thoracic spine  Heart: Regular rate and rhythm, S1 and S2 normal, no murmur, rub or gallop  Abdomen: Soft, non-tender, bowel sounds active all four quadrants, no masses, no organomegaly  Extremities: Extremities normal, no cyanosis or edema. Exquisitely tender to palpation  over the lateral mid right leg  Pulses: 2+ and symmetric all extremities  Skin: Skin color, texture, turgor normal, no rashes or lesions  Neuro: CNII-XII intact. Nonfocal exam.  He does have diffuse fasciculations in facial muscles arms and legs   Lab Results:  Basename 08/21/12 0540 08/20/12 1939 08/20/12 1428  NA 140 -- 135  K 3.7 -- 4.7  CL 105 -- 100  CO2 29 -- 23  GLUCOSE 111* -- 102*  BUN 26* -- 37*  CREATININE 1.23 1.40* --  CALCIUM 9.0 -- 9.0  MG -- -- --  PHOS -- -- --    Basename 08/20/12 1428  AST 23  ALT 10  ALKPHOS 97  BILITOT 0.4  PROT 6.4  ALBUMIN 3.4*   No results found for this basename: LIPASE:2,AMYLASE:2 in the last 72 hours  Basename 08/21/12 0540 08/20/12 1939 08/20/12 1428  WBC 8.2 8.6 --  NEUTROABS -- -- 7.4  HGB 10.3* 10.8* --  HCT 31.1* 33.0* --  MCV 85.9 85.7 --  PLT PLATELET CLUMPING, SUGGEST RECOLLECTION OF SAMPLE IN CITRATE TUBE. PLATELETS APPEAR ADEQUATE --   No results found for this basename: CKTOTAL:3,CKMB:3,CKMBINDEX:3,TROPONINI:3 in the last 72 hours No components found with this basename: POCBNP:3 No results found for this basename: DDIMER:2 in the last 72 hours No results found for this basename: HGBA1C:2 in the last 72 hours No results found for this basename: CHOL:2,HDL:2,LDLCALC:2,TRIG:2,CHOLHDL:2,LDLDIRECT:2 in  the last 72 hours No results found for this basename: TSH,T4TOTAL,FREET3,T3FREE,THYROIDAB in the last 72 hours No results found for this basename: VITAMINB12:2,FOLATE:2,FERRITIN:2,TIBC:2,IRON:2,RETICCTPCT:2 in the last 72 hours  Studies/Results: No results found. Medications: Scheduled Meds:   . albuterol  2.5 mg Nebulization TID  . allopurinol  100 mg Oral BID  . aspirin EC  81 mg Oral Daily  . bicalutamide  50 mg Oral Daily  . bimatoprost  1 drop Both Eyes QHS  . calcium-vitamin D  1 tablet Oral BID  . cilostazol  50 mg Oral BID  . diazepam  5 mg Oral See admin instructions  . donepezil  10 mg Oral QHS  .  DULoxetine  30 mg Oral BID  . guaiFENesin  600 mg Oral BID  . heparin  5,000 Units Subcutaneous Q8H  . HYDROcodone-acetaminophen  1 tablet Oral Q6H  . lidocaine  1 patch Transdermal Q24H  . memantine  10 mg Oral BID  . metoprolol succinate  25 mg Oral Daily  . sodium chloride  3 mL Intravenous Q12H  . sodium chloride  3 mL Intravenous Q12H  . Tamsulosin HCl  0.4 mg Oral QHS  . tiotropium  18 mcg Inhalation Daily  . tobramycin-dexamethasone  2 drop Both Eyes TID  . DISCONTD: cyclobenzaprine  5 mg Oral TID  . DISCONTD: diazepam  10 mg Oral QHS   Continuous Infusions:  PRN Meds:.sodium chloride, albuterol, HYDROcodone-acetaminophen, ondansetron (ZOFRAN) IV, ondansetron, sodium chloride, DISCONTD:  HYDROmorphone (DILAUDID) injection  Assessment/Plan:  Active Problems:  1. Fall. Patient has a known history of multiple falls, which may be multi-factorial. On this occasion, he appeared to have sustained a mechanical fall. Continue PT OT as tolerated. May not be tolerated until further therapy for T12 compression fracture has been performed  2. Back pain/right LE Radiculopathy: Worsening of previous T12 compression fracture. Interventional radiology consulted to see if kyphoplasty could be performed  3. HTN (hypertension): BP is controlled at this time  4. CAD (coronary artery disease): Patient is s/p CABG and tissue AVR in 2004. He has no symptoms suggestive of CAD, at this time. 12-lead EKG shows SR/RBBB, but no acute ischemic changes.  5. Dehydration/Acute kidney injury: Creatinine down to 1.23 from 1.40, improved with rehydration  6. OSA: Not currently on CPAP. Patient claims that he quit using this about 3-5 years ago. Perhaps, rounding MD can persuade him to restart  7. Dementia: Stable. Will continue pre-admission psychotropics.  8. COPD: Stable. Will manage with albuterol nebs and Spiriva handihaler.  9. Disposition - will almost certainly need skilled nursing facility placement after  discharge or will need to be discharged home with 24-hour care  10. Bilateral conjunctivitis - try TobraDex 3 times a day  11. Right leg pain - likely secondary to contusion of muscle. No fracture on x-ray 12.  Fasciculations - apparently had been progressive over the past 3-6 weeks.  Certainly very prominent today and more active when he is dozing off.  We'll check a full panel of electrolytes.  Will also increase Valium with an additional a.m. dose and stop Flexeril.  Likely metabolic in nature with diffuse nature involving his entire body.  [facility] consult neurology for their opinion on workup and therapy.  Wonder about withdrawal from one of his home medications or simply a side effect from one of these medications   LOS: 3 days   Jessyka Austria NEVILL 08/23/2012, 12:14 PM

## 2012-08-23 NOTE — Progress Notes (Signed)
CSW contacted bedside RN re: patient's possible d/c today. RN stated according to MD in progression patient is not planning to d/c today. RN will contact this CSW if d/c plans change over the weekend. CSW will continue to follow patient.  Lia Foyer, LCSWA [facility] Clinical Social Worker Contact #: [phone] (weekend)

## 2012-08-23 NOTE — Progress Notes (Signed)
PT Cancellation Note  Patient Details Name: DAEMIEN FRONCZAK MRN: [id number] DOB: 22-Aug-1924   Cancelled Treatment:    Reason Eval/Treat Not Completed: Medical issues which prohibited therapy;Other (comment) (per conversation with  Dr. Kevan Ny, do not see today due to pt. With severe twitching.  MD is evaluating pt's meds and will check electrolytes.Dr. Kevan Ny states for PT to check on pt. Tomorrow and proceed if pt. Improved.   Ferman Hamming 08/23/2012, 12:03 PM Weldon Picking PT Acute Rehab Services [phone] Bee[place] [phone]

## 2012-08-24 LAB — BASIC METABOLIC PANEL
BUN: 14 mg/dL (ref 6–23)
Calcium: 9.4 mg/dL (ref 8.4–10.5)
Chloride: 100 mEq/L (ref 96–112)
Creatinine, Ser: 1 mg/dL (ref 0.50–1.35)
GFR calc Af Amer: 76 mL/min — ABNORMAL LOW (ref >90)
GFR calc non Af Amer: 66 mL/min — ABNORMAL LOW (ref >90)

## 2012-08-24 MED ORDER — DEXTROSE 5 % IV SOLN
1.0000 g | INTRAVENOUS | Status: DC
  Administered 2012-08-24 – 2012-08-27 (×4): 1 g via INTRAVENOUS
  Filled 2012-08-24 (×4): qty 10

## 2012-08-24 NOTE — Progress Notes (Signed)
Subjective: Patient is still exhibiting confusion but has been found to have an Escherichia coli UTI from culture from October 24.   White count has been slowly rising and is just entering the abnormal range.  Perhaps this is contributing to his confusion.  He has been having some fasciculations actually seem better today.  Speaking some gibberish but also aware that he isn't count hospital and recognizes me.  Speaks quickly almost manically.  Continues to have back pain but no other complaints of pain .  Appears to be intermittently delusional and talking to self. Did not sleep well last night Objective: Weight change:   Intake/Output Summary (Last 24 hours) at 08/24/12 1112 Last data filed at 08/24/12 0700  Gross per 24 hour  Intake      0 ml  Output      0 ml  Net      0 ml   Filed Vitals:   08/23/12 1412 08/23/12 2113 08/24/12 0433 08/24/12 0839  BP: 121/68 172/72 154/69   Pulse: 111 112 110   Temp: 98.7 F (37.1 C) 98.2 F (36.8 C) 98.4 F (36.9 C)   TempSrc: Oral Oral Oral   Resp: 18 18 18    Height:      Weight:      SpO2: 94% 96% 91% 93%    General Appearance: Patient seems intermittently confused but is conversive.  Generalized fasciculations have decreased quite a bit since yesterday. Apparently have been present for about a month on history from daughter and caregiver  Head: Normocephalic, without obvious abnormality, atraumatic. Dry exudate around both eyes with mild erythema, improved with TobraDex drops Neck: Supple, symmetrical  Lungs: Clear to auscultation bilaterally, respirations unlabored  Back: Pain to mild percussion over the lower thoracic spine  Heart: Regular rate and rhythm, S1 and S2 normal, no murmur, rub or gallop  Abdomen: Soft, non-tender, bowel sounds active all four quadrants, no masses, no organomegaly  Extremities: Extremities normal, no cyanosis or edema. Exquisitely tender to palpation over the lateral mid right leg  Pulses: 2+ and symmetric all  extremities  Skin: Skin color, texture, turgor normal, no rashes or lesions  Neuro: CNII-XII intact. Nonfocal exam. Fasciculations in facial muscles arms and legs have improved since yesterday and are much better    Lab Results:  [facility] 08/23/12 1338  NA 136  K 3.8  CL 102  CO2 26  GLUCOSE 144*  BUN 16  CREATININE 1.03  CALCIUM 9.1  MG 2.0  PHOS 2.7   No results found for this basename: AST:2,ALT:2,ALKPHOS:2,BILITOT:2,PROT:2,ALBUMIN:2 in the last 72 hours No results found for this basename: LIPASE:2,AMYLASE:2 in the last 72 hours  Basename 08/23/12 1338  WBC 12.3*  NEUTROABS 9.3*  HGB 11.7*  HCT 34.6*  MCV 85.9  PLT PLATELET CLUMPS NOTED ON SMEAR, COUNT APPEARS ADEQUATE   No results found for this basename: CKTOTAL:3,CKMB:3,CKMBINDEX:3,TROPONINI:3 in the last 72 hours No components found with this basename: POCBNP:3 No results found for this basename: DDIMER:2 in the last 72 hours No results found for this basename: HGBA1C:2 in the last 72 hours No results found for this basename: CHOL:2,HDL:2,LDLCALC:2,TRIG:2,CHOLHDL:2,LDLDIRECT:2 in the last 72 hours No results found for this basename: TSH,T4TOTAL,FREET3,T3FREE,THYROIDAB in the last 72 hours No results found for this basename: VITAMINB12:2,FOLATE:2,FERRITIN:2,TIBC:2,IRON:2,RETICCTPCT:2 in the last 72 hours  Studies/Results: No results found. Medications: Scheduled Meds:   . albuterol  2.5 mg Nebulization TID  . allopurinol  100 mg Oral BID  . aspirin EC  81 mg Oral Daily  .  bicalutamide  50 mg Oral Daily  . bimatoprost  1 drop Both Eyes QHS  . calcium-vitamin D  1 tablet Oral BID  . cefTRIAXone (ROCEPHIN)  IV  1 g Intravenous Q24H  . cilostazol  50 mg Oral BID  . diazepam  10 mg Oral QHS  . diazepam  5 mg Oral Q24H  . donepezil  10 mg Oral QHS  . DULoxetine  30 mg Oral BID  . guaiFENesin  600 mg Oral BID  . heparin  5,000 Units Subcutaneous BID  . HYDROcodone-acetaminophen  1 tablet Oral Q6H  . lidocaine   1 patch Transdermal Q24H  . memantine  10 mg Oral BID  . metoprolol succinate  25 mg Oral Daily  . sodium chloride  3 mL Intravenous Q12H  . sodium chloride  3 mL Intravenous Q12H  . Tamsulosin HCl  0.4 mg Oral QHS  . tiotropium  18 mcg Inhalation Daily  . tobramycin-dexamethasone  2 drop Both Eyes TID  . DISCONTD: cyclobenzaprine  5 mg Oral TID  . DISCONTD: diazepam  10 mg Oral QHS  . DISCONTD: diazepam  5 mg Oral See admin instructions  . DISCONTD: heparin  5,000 Units Subcutaneous Q8H   Continuous Infusions:  PRN Meds:.sodium chloride, albuterol, HYDROcodone-acetaminophen, ondansetron (ZOFRAN) IV, ondansetron, sodium chloride, DISCONTD:  HYDROmorphone (DILAUDID) injection  Assessment/Plan:  Active Problems:  1. Fall. Patient has a known history of multiple falls, which may be multi-factorial. On this occasion, he appeared to have sustained a mechanical fall. Continue PT OT as tolerated. May not be tolerated until further therapy for T12 compression fracture has been performed  2. Back pain/right LE Radiculopathy: Worsening of previous T12 compression fracture. Interventional radiology consulted to see if kyphoplasty could be performed - hopefully they will see him on Monday, October 28, tomorrow 3. HTN (hypertension): BP is controlled at this time  4. CAD (coronary artery disease): Patient is s/p CABG and tissue AVR in 2004. He has no symptoms suggestive of CAD, at this time. 12-lead EKG shows SR/RBBB, but no acute ischemic changes.  5. Dehydration/Acute kidney injury: Creatinine down to 1.03 from 1.40, improved with rehydration  6. OSA: Not currently on CPAP. Patient claims that he quit using this about 3-5 years ago. Perhaps, rounding MD can persuade him to restart  7. Dementia: Stable. Will continue pre-admission psychotropics.  8. COPD: Stable. Will manage with albuterol nebs and Spiriva handihaler.  9. Disposition - will certainly need skilled nursing facility placement after  discharge and agrees to go to [facility] skilled nursing facility for rehabilitation.  Will discuss further with his daughter 63. Bilateral conjunctivitis - continue TobraDex 3 times a day - working well 11. Right leg pain - likely secondary to contusion of muscle. No fracture on x-ray - has improved 12. Fasciculations - apparently had been progressive over the past 3-6 weeks.  Better today than yesterday.  Continue Valium with an additional a.m. dose.  Likely metabolic in nature with diffuse nature involving his entire body.  Discussed briefly with neurology yesterday and they mentioned that tramadol could certainly cause diffuse fasciculations and also other  narcotics.  Hopefully will continue to improve  13.  Escherichia coli UTI  - may be contributing to mental status changes and leukocytosis  - start IV Rocephin     LOS: 4 days   Logan Allen 08/24/2012, 11:12 AM

## 2012-08-24 NOTE — Progress Notes (Signed)
Physical Therapy Evaluation Patient Details Name: Logan Allen MRN: [id number] DOB: [birth year] Today's Date: 08/24/2012 Time: 4098-1191 PT Time Calculation (min): 22 min  PT Assessment / Plan / Recommendation Clinical Impression  78 yo male admitted with fall and vertebral compression fxs; To be eval'd by Int. Rad. for appropriateness of kyphoplasty on 10/28; Presents with decr functional mobility; Will benefit from PT to maxomize independence and safety with mobility, balance and activity tol, and facilitate dc planning    PT Assessment  Patient needs continued PT services    Follow Up Recommendations  Post acute inpatient    Does the patient have the potential to tolerate intense rehabilitation   Yes, Recommend IP Rehab Screening (worth considering screen;not sure how well pt will progress)  Barriers to Discharge Decreased caregiver support      Equipment Recommendations  3 in 1 bedside comode;Rolling walker with 5" wheels (pt may already have)    Recommendations for Other Services OT consult   Frequency Min 3X/week    Precautions / Restrictions Precautions Precautions: Fall;Back Precaution Comments: Back prec at this time for comfort   Pertinent Vitals/Pain No overt signs of pain during transfer; Pt indicated he was comfortable in chair end of session      Mobility  Bed Mobility Bed Mobility: Supine to Sit;Sitting - Scoot to Edge of Bed Supine to Sit: 3: Mod assist;With rails Sitting - Scoot to Edge of Bed: 3: Mod assist;With rail Details for Bed Mobility Assistance: Cues for technique and safety; Physical assist (using bed pad)  to complete task of squaring hips off at EOB Transfers Transfers: Sit to Stand;Stand to Sit;Stand Pivot Transfers Sit to Stand: 1: +2 Total assist;From bed Sit to Stand: Patient Percentage: 40% Stand to Sit: 1: +2 Total assist;To chair/3-in-1;Without upper extremity assist Stand to Sit: Patient Percentage: 40% Stand Pivot Transfers: 1: +2  Total assist Stand Pivot Transfers: Patient Percentage: 40% Details for Transfer Assistance: Cues for initiation and technique; Noted heavy postreior lean with bracing backs of LEs against bed; quite ataxic steps bed to chair with UE support on RW and +2 assist for balance    Shoulder Instructions     Exercises     PT Diagnosis: Difficulty walking;Generalized weakness;Acute pain  PT Problem List: Decreased strength;Decreased activity tolerance;Decreased balance;Decreased mobility;Decreased coordination;Decreased cognition;Decreased knowledge of use of DME;Decreased safety awareness;Pain PT Treatment Interventions: DME instruction;Gait training;Functional mobility training;Therapeutic activities;Therapeutic exercise;Balance training;Cognitive remediation;Patient/family education   PT Goals Acute Rehab PT Goals PT Goal Formulation: With patient Time For Goal Achievement: 09/07/12 Potential to Achieve Goals: Fair Pt will Roll Supine to Right Side: with supervision (log roll for comfort) PT Goal: Rolling Supine to Right Side - Progress: Goal set today Pt will Roll Supine to Left Side: with supervision (log roll for comfort) PT Goal: Rolling Supine to Left Side - Progress: Goal set today Pt will go Supine/Side to Sit: with supervision PT Goal: Supine/Side to Sit - Progress: Goal set today Pt will go Sit to Supine/Side: with supervision PT Goal: Sit to Supine/Side - Progress: Goal set today Pt will go Sit to Stand: with supervision PT Goal: Sit to Stand - Progress: Goal set today Pt will go Stand to Sit: with supervision PT Goal: Stand to Sit - Progress: Goal set today Pt will Transfer Bed to Chair/Chair to Bed: with supervision PT Transfer Goal: Bed to Chair/Chair to Bed - Progress: Goal set today Pt will Ambulate: >150 feet;with supervision;with rolling walker PT Goal: Ambulate - Progress: Goal set today  Visit Information  Last PT Received On: 08/24/12 Assistance Needed: +2      Subjective Data  Subjective: Agreeable to OOB; at one point during interview pt recognized he is fortunate to have assist in his home Patient Stated Goal: not stated, but agreeable to PT   Prior Functioning  Home Living Lives With: Alone Available Help at Discharge: Personal care attendant;Other (Comment) (Available 11am to 7pm per Case Mgr note) Type of Home: House Home Access: Stairs to enter [employer] of Steps: 3 Entrance Stairs-Rails:  (one rail ?) Home Layout: One level Bathroom Shower/Tub: Tub/shower unit Home Adaptive Equipment: Walker - rolling (Need more info) Additional Comments: Frequent falls Prior Function Level of Independence: Independent with assistive device(s) Comments: Will need mroe info re: PLOF; Pt with difficulty answering questions at times Communication Communication: Other (comment) (very tangential conversation, and not quite completing thoug)    Cognition  Overall Cognitive Status: Impaired (Not entirely sure of baseline cognition) Area of Impairment: Attention;Memory Arousal/Alertness: Awake/alert Orientation Level: Disoriented to;Time Behavior During Session: Restless Current Attention Level: Sustained Attention - Other Comments: tending to shift subjects before completing thoughts Memory Deficits: History of dementia per Dr. Kevan Ny' note    Extremity/Trunk Assessment Right Upper Extremity Assessment RUE ROM/Strength/Tone: [facility] for tasks assessed Left Upper Extremity Assessment LUE ROM/Strength/Tone: [facility] for tasks assessed Right Lower Extremity Assessment RLE ROM/Strength/Tone: Deficits RLE ROM/Strength/Tone Deficits: Generally weak, requiring +2 assist for sit to stand Left Lower Extremity Assessment LLE ROM/Strength/Tone: Deficits LLE ROM/Strength/Tone Deficits: generally weak, requiring +2 assist for sit to stand   Balance    End of Session PT - End of Session Equipment Utilized During Treatment: Gait belt Activity Tolerance:  Patient tolerated treatment well Patient left: in chair;with call bell/phone within reach;with family/visitor present (Seemed comfortable in chair) Nurse Communication: Mobility status  GP     Olen Pel [place], [place] [phone]  08/24/2012, 2:02 PM

## 2012-08-24 NOTE — Progress Notes (Signed)
Spoke with Dr. Kevan Ny regarding Rocephin he stated that it is  okay  To give.

## 2012-08-24 NOTE — Progress Notes (Signed)
Patient has not slept tonight, patient had noticeable tremors in arms and legs starting around 4 am.  When talking to patient the tremors subsided.  Will continue to monitor.  Macarthur Critchley, RN

## 2012-08-24 NOTE — Progress Notes (Signed)
Rehab Admissions Coordinator Note:  Patient was screened by Clois Dupes for appropriateness for an Inpatient Acute Rehab Consult.  [employer] will not consider an inpt rehab admission for this diagnosis. Agree with Dr. Kevan Ny that he will need SNF rehab likely. At this time, we are recommending Skilled Nursing Facility.  Clois Dupes, RN 08/24/2012, 6:19 PM  I can be reached at [phone].

## 2012-08-25 ENCOUNTER — Other Ambulatory Visit (HOSPITAL_COMMUNITY): Payer: PRIVATE HEALTH INSURANCE

## 2012-08-25 ENCOUNTER — Encounter (HOSPITAL_COMMUNITY): Payer: Self-pay | Admitting: Radiology

## 2012-08-25 LAB — CBC WITH DIFFERENTIAL/PLATELET
Basophils Absolute: 0 10*3/uL (ref 0.0–0.1)
Eosinophils Absolute: 0.5 10*3/uL (ref 0.0–0.7)
Eosinophils Relative: 5 % (ref 0–5)
HCT: 36.3 % — ABNORMAL LOW (ref 39.0–52.0)
Lymphocytes Relative: 18 % (ref 12–46)
MCH: 28.5 pg (ref 26.0–34.0)
MCHC: 33.3 g/dL (ref 30.0–36.0)
MCV: 85.6 fL (ref 78.0–100.0)
Monocytes Absolute: 1 10*3/uL (ref 0.1–1.0)
Platelets: UNDETERMINED 10*3/uL (ref 150–400)
RDW: 14.4 % (ref 11.5–15.5)
WBC: 9.9 10*3/uL (ref 4.0–10.5)

## 2012-08-25 LAB — BASIC METABOLIC PANEL
CO2: 27 mEq/L (ref 19–32)
Calcium: 9.3 mg/dL (ref 8.4–10.5)
Creatinine, Ser: 1.11 mg/dL (ref 0.50–1.35)
GFR calc non Af Amer: 58 mL/min — ABNORMAL LOW (ref >90)
Sodium: 140 mEq/L (ref 135–145)

## 2012-08-25 NOTE — Progress Notes (Signed)
Subjective: Confusion much improved today and patient is speaking normally. UTI was probably contributing to his symptoms. Fasciculations are better, unexplained  Objective: Weight change:   Intake/Output Summary (Last 24 hours) at 08/25/12 0909 Last data filed at 08/25/12 0900  Gross per 24 hour  Intake    300 ml  Output      0 ml  Net    300 ml   Filed Vitals:   08/24/12 2230 08/24/12 2325 08/25/12 0516 08/25/12 0826  BP: 170/76 170/73 145/72 93/59  Pulse:   104 114  Temp:   97 F (36.1 C) 98 F (36.7 C)  TempSrc:   Axillary Oral  Resp:   18 18  Height:      Weight:      SpO2:   94% 91%   General Appearance: Patient clear this morning. Generalized fasciculations have essentially resolved. Head: Normocephalic, without obvious abnormality, atraumatic. Dry exudate around both eyes with mild erythema, improved with TobraDex drops  Neck: Supple, symmetrical  Lungs: Clear to auscultation bilaterally, respirations unlabored  Back: Pain to mild percussion over the lower thoracic spine  Heart: Regular rate and rhythm, S1 and S2 normal, no murmur, rub or gallop  Abdomen: Soft, non-tender, bowel sounds active all four quadrants, no masses, no organomegaly  Extremities: Extremities normal, no cyanosis or edema. Exquisitely tender to palpation over the lateral mid right leg  Pulses: 2+ and symmetric all extremities  Skin: Skin color, texture, turgor normal, no rashes or lesions  Neuro: CNII-XII intact. Nonfocal exam. Fasciculations in facial muscles arms and legs have improved since yesterday and are much better   Lab Results:  Basename 08/25/12 0630 08/24/12 1041 08/23/12 1338  NA 140 136 --  K 3.7 3.6 --  CL 103 100 --  CO2 27 26 --  GLUCOSE 123* 122* --  BUN 18 14 --  CREATININE 1.11 1.00 --  CALCIUM 9.3 9.4 --  MG -- -- 2.0  PHOS -- -- 2.7   No results found for this basename: AST:2,ALT:2,ALKPHOS:2,BILITOT:2,PROT:2,ALBUMIN:2 in the last 72 hours No results found for this  basename: LIPASE:2,AMYLASE:2 in the last 72 hours  Basename 08/25/12 0630 08/23/12 1338  WBC 9.9 12.3*  NEUTROABS 6.6 9.3*  HGB 12.1* 11.7*  HCT 36.3* 34.6*  MCV 85.6 85.9  PLT PLATELET CLUMPS NOTED ON SMEAR, UNABLE TO ESTIMATE PLATELET CLUMPS NOTED ON SMEAR, COUNT APPEARS ADEQUATE   No results found for this basename: CKTOTAL:3,CKMB:3,CKMBINDEX:3,TROPONINI:3 in the last 72 hours No components found with this basename: POCBNP:3 No results found for this basename: DDIMER:2 in the last 72 hours No results found for this basename: HGBA1C:2 in the last 72 hours No results found for this basename: CHOL:2,HDL:2,LDLCALC:2,TRIG:2,CHOLHDL:2,LDLDIRECT:2 in the last 72 hours No results found for this basename: TSH,T4TOTAL,FREET3,T3FREE,THYROIDAB in the last 72 hours No results found for this basename: VITAMINB12:2,FOLATE:2,FERRITIN:2,TIBC:2,IRON:2,RETICCTPCT:2 in the last 72 hours  Studies/Results: No results found. Medications: Scheduled Meds:   . albuterol  2.5 mg Nebulization TID  . allopurinol  100 mg Oral BID  . aspirin EC  81 mg Oral Daily  . bicalutamide  50 mg Oral Daily  . bimatoprost  1 drop Both Eyes QHS  . calcium-vitamin D  1 tablet Oral BID  . cefTRIAXone (ROCEPHIN)  IV  1 g Intravenous Q24H  . cilostazol  50 mg Oral BID  . diazepam  10 mg Oral QHS  . diazepam  5 mg Oral Q24H  . donepezil  10 mg Oral QHS  . DULoxetine  30 mg  Oral BID  . guaiFENesin  600 mg Oral BID  . heparin  5,000 Units Subcutaneous BID  . HYDROcodone-acetaminophen  1 tablet Oral Q6H  . lidocaine  1 patch Transdermal Q24H  . memantine  10 mg Oral BID  . metoprolol succinate  25 mg Oral Daily  . sodium chloride  3 mL Intravenous Q12H  . sodium chloride  3 mL Intravenous Q12H  . Tamsulosin HCl  0.4 mg Oral QHS  . tiotropium  18 mcg Inhalation Daily  . tobramycin-dexamethasone  2 drop Both Eyes TID   Continuous Infusions:  PRN Meds:.sodium chloride, albuterol, HYDROcodone-acetaminophen, ondansetron  (ZOFRAN) IV, ondansetron, sodium chloride  Assessment/Plan:  Active Problems:  1. Fall. Patient has a known history of multiple falls, which may be multi-factorial. On this occasion, he appeared to have sustained a mechanical fall. Continue PT OT as tolerated. May not be tolerated until further therapy for T12 compression fracture has been performed  2. Back pain/right LE Radiculopathy: Worsening of previous T12 compression fracture. Interventional radiology consulted to see if kyphoplasty could be performed - hopefully they will see him today, October 28 3. HTN (hypertension): BP is controlled at this time  4. CAD (coronary artery disease): Patient is s/p CABG and tissue AVR in 2004. He has no symptoms suggestive of CAD, at this time. 12-lead EKG shows SR/RBBB, but no acute ischemic changes.  5. Dehydration/Acute kidney injury: Creatinine down to 1.03 from 1.40, improved with rehydration  6. OSA: Not currently on CPAP. Patient claims that he quit using this about 3-5 years ago. Perhaps, rounding MD can persuade him to restart  7. Dementia: Stable. Will continue pre-admission psychotropics.  8. COPD: Stable. Will manage with albuterol nebs and Spiriva handihaler.  9. Disposition - will certainly need skilled nursing facility placement after discharge and agrees to go to [facility] skilled nursing facility for rehabilitation. Will discuss further with his daughter  55. Bilateral conjunctivitis - continue TobraDex 3 times a day - working well  11. Right leg pain - likely secondary to contusion of muscle. No fracture on x-ray - has improved  12. Fasciculations - resolving with treatment of UTI 13. Escherichia coli UTI - Continue IV Rocephin - mental status much improved and fasciculations resolved     LOS: 5 days   Logan Allen NEVILL 08/25/2012, 9:09 AM

## 2012-08-25 NOTE — Evaluation (Signed)
Occupational Therapy Evaluation Patient Details Name: Logan Allen MRN: [id number] DOB: 03/21/1924 Today's Date: 08/25/2012 Time: 4098-1191 OT Time Calculation (min): 15 min  OT Assessment / Plan / Recommendation Clinical Impression  Pt admitted s/p fall and difficulty ambulating.  Pt with history of recurrent falls per chart review.  MRI findings show multiple vertebral compression fractures.  Will benefit from acute OT services.  Recommending SNF for d/c plan as pt will need 24/7 assist at home.    OT Assessment  Patient needs continued OT Services    Follow Up Recommendations  Skilled nursing facility    Barriers to Discharge Decreased caregiver support Family unavailable during session to determine how much assist available.  Per Child psychotherapist notes in chart, family requesting SNF at d/c,  Equipment Recommendations  3 in 1 bedside comode    Recommendations for Other Services    Frequency  Min 2X/week    Precautions / Restrictions Precautions Precautions: Fall;Back   Pertinent Vitals/Pain See vitals    ADL  Eating/Feeding: Performed;Set up Where Assessed - Eating/Feeding: Bed level Grooming: Performed;Wash/dry face;Supervision/safety Where Assessed - Grooming: Supported sitting Lower Body Bathing: Simulated;+1 Total assistance Where Assessed - Lower Body Bathing: Supported sitting Lower Body Dressing: Performed;+1 Total assistance (bil socks) Where Assessed - Lower Body Dressing: Supported sitting Transfers/Ambulation Related to ADLs: Not assessed at this time due to need for further assist (not available at this time) ADL Comments: Pt required assist for sitting balance during all tasks.  Session limited due to arrival of meal tray and pt wishing to eat.     OT Diagnosis: Generalized weakness;Cognitive deficits  OT Problem List: Impaired balance (sitting and/or standing);Decreased strength;Decreased cognition;Decreased knowledge of use of DME or AE;Decreased knowledge  of precautions OT Treatment Interventions: Self-care/ADL training;DME and/or AE instruction;Therapeutic activities;Patient/family education;Balance training   OT Goals Acute Rehab OT Goals OT Goal Formulation: With patient Time For Goal Achievement: 09/08/12 Potential to Achieve Goals: Good ADL Goals Pt Will Perform Grooming: with modified independence;Sitting, chair;Sitting, edge of bed;Unsupported ADL Goal: Grooming - Progress: Goal set today Pt Will Perform Upper Body Bathing: with modified independence;Sitting, chair;Sitting, edge of bed ADL Goal: Upper Body Bathing - Progress: Goal set today Pt Will Perform Lower Body Bathing: with modified independence;Sitting, chair;Sitting, edge of bed;with adaptive equipment ADL Goal: Lower Body Bathing - Progress: Goal set today Pt Will Transfer to Toilet: with supervision;Ambulation;with DME ADL Goal: Toilet Transfer - Progress: Goal set today Pt Will Perform Toileting - Clothing Manipulation: with supervision;Standing ADL Goal: Toileting - Clothing Manipulation - Progress: Goal set today Pt Will Perform Toileting - Hygiene: with supervision;Sit to stand from 3-in-1/toilet;Standing at 3-in-1/toilet ADL Goal: Toileting - Hygiene - Progress: Goal set today Miscellaneous OT Goals Miscellaneous OT Goal #1: Pt will perform static sitting balance task with supervision sitting EOB >5 min. OT Goal: Miscellaneous Goal #1 - Progress: Goal set today Miscellaneous OT Goal #2: Pt will perform bed mobility with mod I in prep for EOB ADLs. OT Goal: Miscellaneous Goal #2 - Progress: Goal set today  Visit Information  Last OT Received On: 08/25/12 Assistance Needed: +2    Subjective Data      Prior Functioning     Home Living Lives With: Alone Available Help at Discharge: Personal care attendant;Other (Comment) (Available 11am to 7pm per Case Mgr note) Type of Home: House Home Access: Stairs to enter [employer] of Steps: 3 Home  Layout: One level Bathroom Shower/Tub: Engineer, manufacturing systems: Standard Home Adaptive Equipment: Dan Humphreys -  rolling Additional Comments: Pt unreliable historian and no family available to provide information. Prior Function Level of Independence: Independent with assistive device(s) Comments: Pt unreliable historian and no family available to provide information. Communication Communication: No difficulties         Vision/Perception     Cognition  Overall Cognitive Status: Impaired Area of Impairment: Following commands Arousal/Alertness: Awake/alert Behavior During Session: Flat affect Memory Deficits: History of dementia per Dr. Kevan Ny' note Following Commands: Follows one step commands inconsistently;Follows multi-step commands inconsistently;Follows multi-step commands with increased time.      Extremity/Trunk Assessment Right Upper Extremity Assessment RUE ROM/Strength/Tone: WFL for tasks assessed Left Upper Extremity Assessment LUE ROM/Strength/Tone: WFL for tasks assessed     Mobility Bed Mobility Bed Mobility: Sit to Supine;Sitting - Scoot to Edge of Bed;Supine to Sit Supine to Sit: 3: Mod assist;HOB elevated;With rails Sitting - Scoot to Edge of Bed: 3: Mod assist;With rail Sit to Supine: 3: Mod assist;With rail;HOB elevated Details for Bed Mobility Assistance: Assist to support trunk and LEs out/in to bed. Vc for technique and hand placement. Transfers Transfers: Not assessed     Shoulder Instructions     Exercise     Balance Balance Balance Assessed: Yes Static Sitting Balance Static Sitting - Balance Support: Feet supported Static Sitting - Level of Assistance: 3: Mod assist;4: Min assist Static Sitting - Comment/# of Minutes: min-mod assist for 4 min sitting EOB   End of Session OT - End of Session Activity Tolerance: Patient tolerated treatment well Patient left: in bed;with call bell/phone within reach  GO    08/25/2012 Cipriano Mile OTR/L Pager [phone] Office [phone]  Cipriano Mile 08/25/2012, 11:01 AM

## 2012-08-25 NOTE — H&P (Signed)
Chief Complaint: BAck pain Referring Physician:Gates HPI: Logan Allen is an 78 y.o. male admitted after fall, confusion, and UTI. UA cx showed E. Coli and pt has been abx past 2 days. Mentation clearing since being on abx. Otherwise, c/o back pain, workup finds progression of T12 compression fracture. Pain mostly wehen trying to get up or move. Very little pain at rest. No LE pain or numbness, tingling. No incontinence Request for IR to eval for possible vertebroplasty. Other medical issues stable per primary. IR MD has reviewed imaging and feels pt good candidate for augmentation.  Past Medical History:  Past Medical History  Diagnosis Date  . Coronary artery disease   . Hypertension   . High cholesterol   . COPD (chronic obstructive pulmonary disease)   . Exertional dyspnea 08/20/2012  . History of blood transfusion 1994; 2004    "w/both bypass ORs"  . History of carotid endarterectomy 1994    right  . Recurrent falls     "last fall 08/15/2012" (08/20/2012)  . Arthritis     "I suffer all over my body; dr said it could be from arthritis" (08/20/2012)  . Chronic lower back pain   . Anxiety   . Mental disorder   . Prostate cancer 1988    radiation treatment  . Hypothyroidism   . OSA (obstructive sleep apnea)   . Gout     "thumb" (08/20/2012)  . Incontinence of urine     Past Surgical History:  Past Surgical History  Procedure Date  . Appendectomy   . Cataract extraction w/ intraocular lens  implant, bilateral   . Coronary artery bypass graft 1994; 2004  . Prostatectomy   . Hernia repair     UHR  . Cardiac valve replacement 2004    AVR  . Orchiectomy 2001    bilateral  . Penile prosthesis  removal 2001  . Circumcision 2001    Family History: History reviewed. No pertinent family history.  Social History:  reports that he has quit smoking. His smoking use included Cigarettes. He has a 137.5 pack-year smoking history. He has never used smokeless tobacco. He  reports that he does not drink alcohol or use illicit drugs.  Allergies:  Allergies  Allergen Reactions  . Penicillins Rash  . Percocet (Oxycodone-Acetaminophen) Other (See Comments)    delusional    Medications: Medications Prior to Admission  Medication Sig Dispense Refill  . albuterol (PROVENTIL) (2.5 MG/3ML) 0.083% nebulizer solution Take 2.5 mg by nebulization every 4 (four) hours as needed. For shortness of breath      . allopurinol (ZYLOPRIM) 100 MG tablet Take 100 mg by mouth 2 (two) times daily.      Marland Kitchen aspirin EC 81 MG tablet Take 81 mg by mouth daily.      . bicalutamide (CASODEX) 50 MG tablet Take 50 mg by mouth daily.      . bimatoprost (LUMIGAN) 0.01 % SOLN Place 1 drop into both eyes at bedtime.      . calcium-vitamin D (OSCAL WITH D) 500-200 MG-UNIT per tablet Take 1 tablet by mouth 2 (two) times daily.      . cilostazol (PLETAL) 50 MG tablet Take 50 mg by mouth 2 (two) times daily.      . diazepam (VALIUM) 10 MG tablet Take 10 mg by mouth at bedtime.      . diclofenac sodium (VOLTAREN) 1 % GEL Apply 1 application topically 2 (two) times daily.      Marland Kitchen donepezil (ARICEPT)  10 MG tablet Take 10 mg by mouth at bedtime.      . DULoxetine (CYMBALTA) 30 MG capsule Take 30 mg by mouth 2 (two) times daily.      Marland Kitchen guaiFENesin (MUCINEX) 600 MG 12 hr tablet Take 600 mg by mouth every 12 (twelve) hours as needed. For cough/congestion      . Hydrocodone-Acetaminophen 10-660 MG TABS Take 1 tablet by mouth 4 (four) times daily as needed. For pain      . lidocaine (LIDODERM) 5 % Place 1 patch onto the skin daily. Remove & Discard patch within 12 hours or as directed by MD      . memantine (NAMENDA) 10 MG tablet Take 10 mg by mouth 2 (two) times daily.      . metoprolol succinate (TOPROL-XL) 25 MG 24 hr tablet Take 25 mg by mouth daily.      . naproxen sodium (ANAPROX) 220 MG tablet Take 220 mg by mouth every 12 (twelve) hours.      . potassium chloride SA (K-DUR,KLOR-CON) 20 MEQ tablet  Take 20 mEq by mouth daily.      . Tamsulosin HCl (FLOMAX) 0.4 MG CAPS Take 0.4 mg by mouth at bedtime.      . traMADol-acetaminophen (ULTRACET) 37.5-325 MG per tablet Take 2 tablets by mouth 2 (two) times daily as needed. For pain        Please HPI for pertinent positives, otherwise complete 10 system ROS negative.  Physical Exam: Blood pressure 108/56, pulse 117, temperature 98.1 F (36.7 C), temperature source Oral, resp. rate 18, height 5\' 9"  (1.753 m), weight 174 lb 6.1 oz (79.1 kg), SpO2 93.00%. Body mass index is 25.75 kg/(m^2).   General Appearance:  Alert, cooperative, no distress, appears stated age  Head:  Normocephalic, without obvious abnormality, atraumatic  ENT: Unremarkable  Neck: Supple, symmetrical, trachea midline, no adenopathy, thyroid: not enlarged, symmetric, no tenderness/mass/nodules  Lungs:   Clear to auscultation bilaterally, no w/r/r, respirations unlabored without use of accessory muscles.  Chest Wall:  No tenderness or deformity  Heart:  Regular rate and rhythm, S1, S2 normal, no murmur, rub or gallop. Carotids 2+ without bruit.  Skin: Skin color, texture, turgor normal, no rashes or lesions  Neurologic: Normal affect, no gross deficits.   Results for orders placed during the hospital encounter of 08/20/12 (from the past 48 hour(s))  GLUCOSE, CAPILLARY     Status: Abnormal   Collection Time   08/23/12  9:00 PM      Component Value Range Comment   Glucose-Capillary 116 (*) 70 - 99 mg/dL    Comment 1 Notify RN     BASIC METABOLIC PANEL     Status: Abnormal   Collection Time   08/24/12 10:41 AM      Component Value Range Comment   Sodium 136  135 - 145 mEq/L    Potassium 3.6  3.5 - 5.1 mEq/L    Chloride 100  96 - 112 mEq/L    CO2 26  19 - 32 mEq/L    Glucose, Bld 122 (*) 70 - 99 mg/dL    BUN 14  6 - 23 mg/dL    Creatinine, Ser 1.61  0.50 - 1.35 mg/dL    Calcium 9.4  8.4 - 09.6 mg/dL    GFR calc non Af Amer 66 (*) >90 mL/min    GFR calc Af Amer 76  (*) >90 mL/min   GLUCOSE, CAPILLARY     Status: Abnormal   Collection Time  08/24/12 12:22 PM      Component Value Range Comment   Glucose-Capillary 109 (*) 70 - 99 mg/dL   CBC WITH DIFFERENTIAL     Status: Abnormal   Collection Time   08/25/12  6:30 AM      Component Value Range Comment   WBC 9.9  4.0 - 10.5 K/uL    RBC 4.24  4.22 - 5.81 MIL/uL    Hemoglobin 12.1 (*) 13.0 - 17.0 g/dL    HCT 91.4 (*) 78.2 - 52.0 %    MCV 85.6  78.0 - 100.0 fL    MCH 28.5  26.0 - 34.0 pg    MCHC 33.3  30.0 - 36.0 g/dL    RDW 95.6  21.3 - 08.6 %    Platelets PLATELET CLUMPS NOTED ON SMEAR, UNABLE TO ESTIMATE  150 - 400 K/uL    Neutrophils Relative 67  43 - 77 %    Neutro Abs 6.6  1.7 - 7.7 K/uL    Lymphocytes Relative 18  12 - 46 %    Lymphs Abs 1.8  0.7 - 4.0 K/uL    Monocytes Relative 10  3 - 12 %    Monocytes Absolute 1.0  0.1 - 1.0 K/uL    Eosinophils Relative 5  0 - 5 %    Eosinophils Absolute 0.5  0.0 - 0.7 K/uL    Basophils Relative 0  0 - 1 %    Basophils Absolute 0.0  0.0 - 0.1 K/uL   BASIC METABOLIC PANEL     Status: Abnormal   Collection Time   08/25/12  6:30 AM      Component Value Range Comment   Sodium 140  135 - 145 mEq/L    Potassium 3.7  3.5 - 5.1 mEq/L    Chloride 103  96 - 112 mEq/L    CO2 27  19 - 32 mEq/L    Glucose, Bld 123 (*) 70 - 99 mg/dL    BUN 18  6 - 23 mg/dL    Creatinine, Ser 5.78  0.50 - 1.35 mg/dL    Calcium 9.3  8.4 - 46.9 mg/dL    GFR calc non Af Amer 58 (*) >90 mL/min    GFR calc Af Amer 67 (*) >90 mL/min    No results found.  Assessment/Plan T12 compression fracture. Discussed indications and goals of treatment with pt and daughter. Discussed procedure and risks UTI with +Cx has been antibiotics since yesterday Afebrile and WBC normal, but Dr. Corliss Skains requests repeat UA in am prior to proceeding. If UA still with evidence of infection, will need to delay. If UA clear, will proceed tomorrow. Consent signed in chart  Brayton El  PA-C 08/25/2012, 2:09 PM

## 2012-08-25 NOTE — Progress Notes (Signed)
Family is interested in SNF bed at [facility]-  I am awaiting callback from them to confirm bed offer- also awaiting possible khyphoplasty prior to d/c per daughter- will update on SNF once this is confirmed. Reece Levy, MSW, Theresia Majors [phone]

## 2012-08-25 NOTE — Progress Notes (Signed)
Utilization review complete 

## 2012-08-26 LAB — URINALYSIS, ROUTINE W REFLEX MICROSCOPIC
Glucose, UA: NEGATIVE mg/dL
Hgb urine dipstick: NEGATIVE
Ketones, ur: NEGATIVE mg/dL
Protein, ur: NEGATIVE mg/dL
Urobilinogen, UA: 1 mg/dL (ref 0.0–1.0)

## 2012-08-26 LAB — URINE MICROSCOPIC-ADD ON

## 2012-08-26 MED ORDER — ALBUTEROL SULFATE (5 MG/ML) 0.5% IN NEBU: 2.5000 mg | INHALATION_SOLUTION | RESPIRATORY_TRACT | Status: DC | PRN

## 2012-08-26 MED ORDER — SODIUM CHLORIDE 0.9 % IV SOLN
250.0000 mL | INTRAVENOUS | Status: AC | PRN
  Administered 2012-08-26: 250 mL via INTRAVENOUS

## 2012-08-26 NOTE — Progress Notes (Signed)
Agree with SPt note Delaney Meigs, PT [phone]

## 2012-08-26 NOTE — Progress Notes (Signed)
Patient ID: Logan Allen, male   DOB: 01/10/1924, 78 y.o.   MRN: [id number]   Pt was tentatively scheduled for T12 KP today in IR Dependant on UA results  UA: cloudy; large WBCs  Dr Corliss Skains has reviewed these result and will not move forward with procedure today  Will recheck ua in am RN aware

## 2012-08-26 NOTE — Progress Notes (Signed)
Physical Therapy Treatment Patient Details Name: Logan Allen MRN: [id number] DOB: 1924-01-17 Today's Date: 08/26/2012 Time: 0834-0900 PT Time Calculation (min): 26 min  PT Assessment / Plan / Recommendation Comments on Treatment Session  Admitted for fall with T12 compression fx; Pt. required less assistance with bed mobility and transfers today. Pt. tolerated ambulation well but continues to complain of R knee pain. Orientation continues to be impaired but pt. was able to follow PTs directions for tasks. Await pt. kyphoplasty.                   Frequency Min 3X/week   Plan Discharge plan remains appropriate;Frequency remains appropriate    Precautions / Restrictions Precautions Precautions: Back;Fall Restrictions Weight Bearing Restrictions: No   Pertinent Vitals/Pain Pt. Reported pain in R leg when ambulating but no pain when sitting in chair at end of tx.    Mobility  Bed Mobility Supine to Sit: 4: Min guard;With rails;HOB flat Sitting - Scoot to Edge of Bed: 5: Supervision Details for Bed Mobility Assistance: Pt. required supervision for safety but pt. was able to perform bed mobility upon command. Min guard with supine -> sit for physical assistance at shoulders. Transfers Sit to Stand: 4: Min assist (x2 from chair) Stand to Sit: 4: Min assist (x2 to chair) Details for Transfer Assistance: Pt. required min A for verbal cueing with hand placement and sequencing of tasks. Ambulation/Gait Ambulation/Gait Assistance: 3: Mod assist Ambulation Distance (Feet): 50 Feet Assistive device: Rolling walker Ambulation/Gait Assistance Details: Followed pt. with chair due to pt. stating his leg gives way and is painfull. Gait Pattern: Step-to pattern;Decreased stride length;Trunk flexed Gait velocity: decreased    Exercises General Exercises - Lower Extremity Long Arc Quad: AROM;Both;15 reps;Seated Hip Flexion/Marching: AROM;Both;20 reps;Seated     PT Goals Acute Rehab PT  Goals PT Goal: Supine/Side to Sit - Progress: Progressing toward goal PT Goal: Sit to Stand - Progress: Progressing toward goal PT Goal: Stand to Sit - Progress: Progressing toward goal PT Goal: Ambulate - Progress: Progressing toward goal  Visit Information  Last PT Received On: 08/26/12 Assistance Needed: +2 (For safety)    Subjective Data  Subjective: Sometimes my leg gives out on me   Cognition  Overall Cognitive Status: Appears within functional limits for tasks assessed/performed Arousal/Alertness: Awake/alert Orientation Level: Disoriented to;Time Behavior During Session: [facility] for tasks performed    Balance  Balance Balance Assessed: Yes Static Standing Balance Static Standing - Balance Support: Bilateral upper extremity supported Static Standing - Level of Assistance: 4: Min assist Static Standing - Comment/# of Minutes: 5 min; Pt. required cueing for upright posture  End of Session PT - End of Session Equipment Utilized During Treatment: Gait belt Activity Tolerance: Patient tolerated treatment well Patient left: in chair;with call bell/phone within reach;with chair alarm set Nurse Communication: Mobility status     Army Chaco SPT 08/26/2012, 9:23 AM

## 2012-08-26 NOTE — Progress Notes (Signed)
Patient awaiting Kyhphoplasty which is on hold until UA is cleared- hopefully tomorrow and then to [facility]. Updated SNF of tentative d/c tomorrow-  Reece Levy, MSW, LCSWA [phone]

## 2012-08-26 NOTE — Progress Notes (Signed)
Condom cath placed on pt to obtain UA.

## 2012-08-26 NOTE — Progress Notes (Signed)
Subjective: Logan Allen continues to feel better.  Back still painful.  Has not urinated this morning.  Mental status is back to normal.  Possible kyphoplasty today.  We'll give IV fluids to stimulate urination to make sure that urinalysis is clearing  Objective: Weight change:   Intake/Output Summary (Last 24 hours) at 08/26/12 0822 Last data filed at 08/25/12 2136  Gross per 24 hour  Intake    726 ml  Output      0 ml  Net    726 ml   Filed Vitals:   08/25/12 1331 08/25/12 2116 08/25/12 2125 08/26/12 0413  BP: 108/56  119/68 94/55  Pulse: 117  111 102  Temp: 98.1 F (36.7 C)  99 F (37.2 C) 98.5 F (36.9 C)  TempSrc: Oral  Oral Oral  Resp: 18  18 18   Height:      Weight:      SpO2: 93% 93% 94% 91%    General Appearance: Mental status continues to improve. Generalized fasciculations have essentially resolved.  Head: Normocephalic, without obvious abnormality, atraumatic. Dry exudate around both eyes with mild erythema, improved with TobraDex drops  Neck: Supple, symmetrical  Lungs: Clear to auscultation bilaterally, respirations unlabored  Back: Pain to mild percussion over the lower thoracic spine  Heart: Regular rate and rhythm, S1 and S2 normal, no murmur, rub or gallop  Abdomen: Soft, non-tender, bowel sounds active all four quadrants, no masses, no organomegaly  Extremities: Extremities normal, no cyanosis or edema. Exquisitely tender to palpation over the lateral mid right leg  Pulses: 2+ and symmetric all extremities  Skin: Skin color, texture, turgor normal, no rashes or lesions  Neuro: CNII-XII intact. Nonfocal exam. Fasciculations in facial muscles arms and legs have improved since yesterday and are much better   Lab Results:  Basename 08/25/12 0630 08/24/12 1041 08/23/12 1338  NA 140 136 --  K 3.7 3.6 --  CL 103 100 --  CO2 27 26 --  GLUCOSE 123* 122* --  BUN 18 14 --  CREATININE 1.11 1.00 --  CALCIUM 9.3 9.4 --  MG -- -- 2.0  PHOS -- -- 2.7   No  results found for this basename: AST:2,ALT:2,ALKPHOS:2,BILITOT:2,PROT:2,ALBUMIN:2 in the last 72 hours No results found for this basename: LIPASE:2,AMYLASE:2 in the last 72 hours  Basename 08/25/12 0630 08/23/12 1338  WBC 9.9 12.3*  NEUTROABS 6.6 9.3*  HGB 12.1* 11.7*  HCT 36.3* 34.6*  MCV 85.6 85.9  PLT PLATELET CLUMPS NOTED ON SMEAR, UNABLE TO ESTIMATE PLATELET CLUMPS NOTED ON SMEAR, COUNT APPEARS ADEQUATE   No results found for this basename: CKTOTAL:3,CKMB:3,CKMBINDEX:3,TROPONINI:3 in the last 72 hours No components found with this basename: POCBNP:3 No results found for this basename: DDIMER:2 in the last 72 hours No results found for this basename: HGBA1C:2 in the last 72 hours No results found for this basename: CHOL:2,HDL:2,LDLCALC:2,TRIG:2,CHOLHDL:2,LDLDIRECT:2 in the last 72 hours No results found for this basename: TSH,T4TOTAL,FREET3,T3FREE,THYROIDAB in the last 72 hours No results found for this basename: VITAMINB12:2,FOLATE:2,FERRITIN:2,TIBC:2,IRON:2,RETICCTPCT:2 in the last 72 hours  Studies/Results: No results found. Medications: Scheduled Meds:   . albuterol  2.5 mg Nebulization TID  . allopurinol  100 mg Oral BID  . aspirin EC  81 mg Oral Daily  . bicalutamide  50 mg Oral Daily  . bimatoprost  1 drop Both Eyes QHS  . calcium-vitamin D  1 tablet Oral BID  . cefTRIAXone (ROCEPHIN)  IV  1 g Intravenous Q24H  . cilostazol  50 mg Oral BID  .  diazepam  10 mg Oral QHS  . diazepam  5 mg Oral Q24H  . donepezil  10 mg Oral QHS  . DULoxetine  30 mg Oral BID  . guaiFENesin  600 mg Oral BID  . heparin  5,000 Units Subcutaneous BID  . HYDROcodone-acetaminophen  1 tablet Oral Q6H  . lidocaine  1 patch Transdermal Q24H  . memantine  10 mg Oral BID  . metoprolol succinate  25 mg Oral Daily  . sodium chloride  3 mL Intravenous Q12H  . sodium chloride  3 mL Intravenous Q12H  . Tamsulosin HCl  0.4 mg Oral QHS  . tiotropium  18 mcg Inhalation Daily  .  tobramycin-dexamethasone  2 drop Both Eyes TID   Continuous Infusions:  PRN Meds:.sodium chloride, albuterol, HYDROcodone-acetaminophen, ondansetron (ZOFRAN) IV, ondansetron, sodium chloride, DISCONTD: sodium chloride  Assessment/Plan:  Active Problems:  1. Fall. Patient has a known history of multiple falls, which may be multi-factorial. On this occasion, he appeared to have sustained a mechanical fall. Continue PT OT as tolerated. May not be tolerated until further therapy for T12 compression fracture has been performed  2. Back pain/right LE Radiculopathy: Worsening of previous T12 compression fracture. Interventional radiology consulted to see if kyphoplasty could be performed - possible vertebroplasty today  3. HTN (hypertension): BP is controlled at this time  4. CAD (coronary artery disease): Patient is s/p CABG and tissue AVR in 2004. He has no symptoms suggestive of CAD, at this time. 12-lead EKG shows SR/RBBB, but no acute ischemic changes.  5. Dehydration/Acute kidney injury: Creatinine down to 1.03 from 1.40, improved with rehydration  6. OSA: Not currently on CPAP. Patient claims that he quit using this about 3-5 years ago. Perhaps, rounding MD can persuade him to restart  7. Dementia: Stable. Will continue pre-admission psychotropics.  8. COPD: Stable. Will manage with albuterol nebs and Spiriva handihaler.  9. Disposition - will certainly need skilled nursing facility placement after discharge and agrees to go to [facility] facility for rehabilitation. Will discuss further with his daughter  52. Bilateral conjunctivitis - continue TobraDex 3 times a day - working well  11. Right leg pain - likely secondary to contusion of muscle. No fracture on x-ray - has improved  12. Fasciculations -  resolved with treatment of UTI  13. Escherichia coli UTI - Continue IV Rocephin - mental status much improved and fasciculations resolved.  Change to by mouth therapy at discharge.   Check urinalysis today    LOS: 6 days   Carliss Quast NEVILL 08/26/2012, 8:22 AM

## 2012-08-27 ENCOUNTER — Inpatient Hospital Stay (HOSPITAL_COMMUNITY): Payer: PRIVATE HEALTH INSURANCE

## 2012-08-27 LAB — CBC WITH DIFFERENTIAL/PLATELET
Basophils Absolute: 0 10*3/uL (ref 0.0–0.1)
Basophils Relative: 0 % (ref 0–1)
HCT: 34.9 % — ABNORMAL LOW (ref 39.0–52.0)
MCHC: 33.2 g/dL (ref 30.0–36.0)
Monocytes Absolute: 0.7 10*3/uL (ref 0.1–1.0)
Neutro Abs: 5.3 10*3/uL (ref 1.7–7.7)
Neutrophils Relative %: 58 % (ref 43–77)
RDW: 14.4 % (ref 11.5–15.5)

## 2012-08-27 LAB — BASIC METABOLIC PANEL
Chloride: 101 mEq/L (ref 96–112)
Creatinine, Ser: 1.19 mg/dL (ref 0.50–1.35)
GFR calc Af Amer: 62 mL/min — ABNORMAL LOW (ref >90)

## 2012-08-27 LAB — URINALYSIS, ROUTINE W REFLEX MICROSCOPIC
Ketones, ur: NEGATIVE mg/dL
Leukocytes, UA: NEGATIVE
Nitrite: NEGATIVE
Protein, ur: NEGATIVE mg/dL
Urobilinogen, UA: 0.2 mg/dL (ref 0.0–1.0)
pH: 5.5 (ref 5.0–8.0)

## 2012-08-27 LAB — URINE CULTURE: Culture: NO GROWTH

## 2012-08-27 MED ORDER — CEFUROXIME AXETIL 250 MG PO TABS: 250.0000 mg | ORAL_TABLET | Freq: Two times a day (BID) | ORAL | Status: AC

## 2012-08-27 MED ORDER — TOBRAMYCIN SULFATE 1.2 G IJ SOLR
INTRAMUSCULAR | Status: AC
  Filled 2012-08-27: qty 1.2

## 2012-08-27 MED ORDER — HYDROCODONE-ACETAMINOPHEN 10-325 MG PO TABS: 1.0000 | ORAL_TABLET | Freq: Four times a day (QID) | ORAL | Status: DC

## 2012-08-27 MED ORDER — HYDROMORPHONE HCL PF 1 MG/ML IJ SOLN
INTRAMUSCULAR | Status: AC | PRN
  Administered 2012-08-27: 1 mg

## 2012-08-27 MED ORDER — MIDAZOLAM HCL 2 MG/2ML IJ SOLN
INTRAMUSCULAR | Status: AC | PRN
  Administered 2012-08-27 (×2): 1 mg via INTRAVENOUS

## 2012-08-27 MED ORDER — FENTANYL CITRATE 0.05 MG/ML IJ SOLN
INTRAMUSCULAR | Status: AC | PRN
  Administered 2012-08-27 (×2): 25 ug via INTRAVENOUS

## 2012-08-27 MED ORDER — TIOTROPIUM BROMIDE MONOHYDRATE 18 MCG IN CAPS: 18.0000 ug | ORAL_CAPSULE | Freq: Every day | RESPIRATORY_TRACT | Status: DC

## 2012-08-27 MED ORDER — TOBRAMYCIN-DEXAMETHASONE 0.3-0.1 % OP SUSP: 2.0000 [drp] | Freq: Three times a day (TID) | OPHTHALMIC | Status: AC

## 2012-08-27 MED ORDER — SODIUM CHLORIDE 0.9 % IV SOLN: INTRAVENOUS | Status: AC

## 2012-08-27 NOTE — Progress Notes (Signed)
Nsg Discharge Note  Admit Date:  08/20/2012 Discharge date: 08/27/2012   Logan Allen to be D/C'd Nursing Home per MD order.  AVS completed.  Copy for chart, and copy for patient signed, and dated. Patient/caregiver able to verbalize understanding.  Discharge Medication:  [name], [name]  Home Medication Instructions [id number]   Printed on:08/27/12 1614  Medication Information                    memantine (NAMENDA) 10 MG tablet Take 10 mg by mouth 2 (two) times daily.           Tamsulosin HCl (FLOMAX) 0.4 MG CAPS Take 0.4 mg by mouth at bedtime.           donepezil (ARICEPT) 10 MG tablet Take 10 mg by mouth at bedtime.           albuterol (PROVENTIL) (2.5 MG/3ML) 0.083% nebulizer solution Take 2.5 mg by nebulization every 4 (four) hours as needed. For shortness of breath           DULoxetine (CYMBALTA) 30 MG capsule Take 30 mg by mouth 2 (two) times daily.           bicalutamide (CASODEX) 50 MG tablet Take 50 mg by mouth daily.           cilostazol (PLETAL) 50 MG tablet Take 50 mg by mouth 2 (two) times daily.           potassium chloride SA (K-DUR,KLOR-CON) 20 MEQ tablet Take 20 mEq by mouth daily.           allopurinol (ZYLOPRIM) 100 MG tablet Take 100 mg by mouth 2 (two) times daily.           bimatoprost (LUMIGAN) 0.01 % SOLN Place 1 drop into both eyes at bedtime.           metoprolol succinate (TOPROL-XL) 25 MG 24 hr tablet Take 25 mg by mouth daily.           aspirin EC 81 MG tablet Take 81 mg by mouth daily.           calcium-vitamin D (OSCAL WITH D) 500-200 MG-UNIT per tablet Take 1 tablet by mouth 2 (two) times daily.           diazepam (VALIUM) 10 MG tablet Take 10 mg by mouth at bedtime.           guaiFENesin (MUCINEX) 600 MG 12 hr tablet Take 600 mg by mouth every 12 (twelve) hours as needed. For cough/congestion           lidocaine (LIDODERM) 5 % Place 1 patch onto the skin daily. Remove & Discard patch within 12 hours or as directed by  MD           HYDROcodone-acetaminophen (NORCO) 10-325 MG per tablet Take 1 tablet by mouth every 6 (six) hours.           tiotropium (SPIRIVA) 18 MCG inhalation capsule Place 1 capsule (18 mcg total) into inhaler and inhale daily.           tobramycin-dexamethasone (TOBRADEX) ophthalmic solution Place 2 drops into both eyes 3 (three) times daily.           cefUROXime (CEFTIN) 250 MG tablet Take 1 tablet (250 mg total) by mouth 2 (two) times daily.             Discharge Assessment: Filed Vitals:   08/27/12 1445  BP: 101/60  Pulse: 92  Temp: 97.9 F (36.6 C)  Resp: 12   Skin clean, dry and intact without evidence of skin break down, no evidence of skin tears noted. IV catheter discontinued intact. Site without signs and symptoms of complications - no redness or edema noted at insertion site, patient denies c/o pain - only slight tenderness at site.  Dressing with slight pressure applied.  D/c Instructions-Education: Discharge instructions sent with patient to [facility].    Patient escorted via PTAR.  Elbert Spickler Consuella Lose, RN 08/27/2012 4:14 PM

## 2012-08-27 NOTE — Progress Notes (Signed)
Patient for d/c today to SNF bed at [facility]. Family and Patient agreeable to this plan- plan transfer via EMS. Reece Levy, MSW, Theresia Majors [phone]

## 2012-08-27 NOTE — Procedures (Signed)
S/p T 12  KP  With biopsy

## 2012-08-27 NOTE — Progress Notes (Signed)
PT Cancellation Note  Patient Details Name: Logan Allen MRN: [id number] DOB: 1924/07/09   Cancelled Treatment:    Reason Eval/Treat Not Completed: Other (comment) (for Kyphoplasty and ? D/C today.  )  Per Charge RN plan is for pt to go to Kyphoplasty and then plan to D/C to SNF later today.     Sunny Schlein, [place] [phone] 08/27/2012, 10:28 AM

## 2012-08-27 NOTE — ED Notes (Signed)
Time out

## 2012-08-27 NOTE — Discharge Summary (Signed)
Physician Discharge Summary  NAME:Logan Allen  ZOX:[id number]  DOB: 19-Mar-1924   Admit date: 08/20/2012 Discharge date: 08/27/2012  Discharge Diagnoses:  Active Problems:  UTI (lower urinary tract infection)  T12 compression fracture Chronic Problems:  Fall - at chronic risk for falling secondary to spinal stenosis and lower she may weakness secondary to DJD of the lumbar Allen  Back pain - persists as above  Radiculopathy with lower extremity symptoms  HTN (hypertension)  CAD (coronary artery disease)  Dehydration - resolved  Acute kidney injury - resolved  Conjunctivitis - resolving  Right leg pain - resolved  Fasciculations of muscle - much improved with treatment of UTI   Discharge Condition: Improved  Hospital Course: Logan. Logan Allen is a pleasant 78 year old male who has been residing at home with home assistance.  He had a recent fall and back pain has progressed.  He has significant chronic DJD of the lumbar Allen resulting in symptoms of spinal stenosis and gait disorder.  He suffered a progression of a T12 compression fracture and is awaiting kyphoplasty or vertebroplasty per interventional radiology.  He has a UTI currently and interventional radiology would like to wait until that fully resolved prior to a procedure on T12. He was experiencing severe fasciculations and mental status changes until treating for a UTI and these symptoms have improved greatly and mental status is much improved. Other medical problems while hospitalized and been stable.  He has a significant gait disorder and needs physical therapy.  Plans are to discharge to skilled nursing facility with rehabilitation and hopefully he can have a kyphoplasty or vertebroplasty within the next week. Logan. Logan Allen is a no CODE BLUE  Consults:    Disposition: 03-Skilled Nursing Facility  Discharge Orders    Future Orders Please Complete By Expires   Diet - low sodium heart healthy      Increase activity slowly       Discharge instructions      Comments:   Transfer via ambulance to [facility] skilled nursing facility in [facility] as a bed is available today   Call MD for:  temperature >100.4      Call MD for:  persistant nausea and vomiting      Call MD for:  severe uncontrolled pain      Call MD for:  difficulty breathing, headache or visual disturbances      Call MD for:  persistant dizziness or light-headedness          Medication List     As of 08/27/2012  8:00 AM    STOP taking these medications         diclofenac sodium 1 % Gel   Commonly known as: VOLTAREN      Hydrocodone-Acetaminophen 10-660 MG Tabs      naproxen sodium 220 MG tablet   Commonly known as: ANAPROX      traMADol-acetaminophen 37.5-325 MG per tablet   Commonly known as: ULTRACET      TAKE these medications         albuterol (2.5 MG/3ML) 0.083% nebulizer solution   Commonly known as: PROVENTIL   Take 2.5 mg by nebulization every 4 (four) hours as needed. For shortness of breath      allopurinol 100 MG tablet   Commonly known as: ZYLOPRIM   Take 100 mg by mouth 2 (two) times daily.      aspirin EC 81 MG tablet   Take 81 mg by mouth daily.  bimatoprost 0.01 % Soln   Commonly known as: LUMIGAN   Place 1 drop into both eyes at bedtime.      calcium-vitamin D 500-200 MG-UNIT per tablet   Commonly known as: OSCAL WITH D   Take 1 tablet by mouth 2 (two) times daily.      CASODEX 50 MG tablet   Generic drug: bicalutamide   Take 50 mg by mouth daily.      cilostazol 50 MG tablet   Commonly known as: PLETAL   Take 50 mg by mouth 2 (two) times daily.      diazepam 10 MG tablet   Commonly known as: VALIUM   Take 10 mg by mouth at bedtime.      donepezil 10 MG tablet   Commonly known as: ARICEPT   Take 10 mg by mouth at bedtime.      DULoxetine 30 MG capsule   Commonly known as: CYMBALTA   Take 30 mg by mouth 2 (two) times daily.      guaiFENesin 600 MG 12 hr tablet    Commonly known as: MUCINEX   Take 600 mg by mouth every 12 (twelve) hours as needed. For cough/congestion      HYDROcodone-acetaminophen 10-325 MG per tablet   Commonly known as: NORCO   Take 1 tablet by mouth every 6 (six) hours.      lidocaine 5 %   Commonly known as: LIDODERM   Place 1 patch onto the skin daily. Remove & Discard patch within 12 hours or as directed by MD      memantine 10 MG tablet   Commonly known as: NAMENDA   Take 10 mg by mouth 2 (two) times daily.      metoprolol succinate 25 MG 24 hr tablet   Commonly known as: TOPROL-XL   Take 25 mg by mouth daily.      potassium chloride SA 20 MEQ tablet   Commonly known as: K-DUR,KLOR-CON   Take 20 mEq by mouth daily.      Tamsulosin HCl 0.4 MG Caps   Commonly known as: FLOMAX   Take 0.4 mg by mouth at bedtime.      tiotropium 18 MCG inhalation capsule   Commonly known as: SPIRIVA   Place 1 capsule (18 mcg total) into inhaler and inhale daily.      tobramycin-dexamethasone ophthalmic solution   Commonly known as: TOBRADEX   Place 2 drops into both eyes 3 (three) times daily.         Things to follow up in the outpatient setting: Need to followup on back pain and UTI at the skilled nursing facility and institute physical therapy as tolerated for ataxia/gait disorder and generalized weakness  Time coordinating discharge: 36 minutes  The results of significant diagnostics from this hospitalization (including imaging, microbiology, ancillary and laboratory) are listed below for reference.    Significant Diagnostic Studies: Dg Chest 2 View  08/20/2012  *RADIOLOGY REPORT*  Clinical Data: Weakness and dizziness.  Recent fall  CHEST - 2 VIEW  Comparison: 11/12/2011  Findings: Prior CABG.  Heart size is normal and there is no heart failure.  Prominent lung markings suggestive of COPD.  Negative for mass or pneumonia.  Moderate compression fracture approximately T9 which is not present on the study of 11/01/2010.   This is indeterminate in age.  IMPRESSION: COPD.  No acute cardiopulmonary disease.  Moderate compression fracture of approximately T9 of indeterminate age.   Original Report Authenticated By: Camelia Phenes,  M.D.    Dg Tibia/fibula Right  08/20/2012  *RADIOLOGY REPORT*  Clinical Data: Fall  RIGHT TIBIA AND FIBULA - 2 VIEW  Comparison: None.  Findings: Negative for fracture.  There is arterial calcification and multiple vascular clips in the soft tissues.  IMPRESSION: Negative for fracture.   Original Report Authenticated By: Camelia Phenes, M.D.    Logan Allen W Wo Contrast  08/21/2012  *RADIOLOGY REPORT*  Clinical Data: Fall.  Back pain.  MRI LUMBAR Allen WITHOUT AND WITH CONTRAST  Technique:  Multiplanar and multiecho pulse sequences of the lumbar Allen were obtained without and with intravenous contrast.  Contrast:  18 ml Multihance IV  Comparison: Lumbar radiographs 07/07/2010  Findings: Image quality degraded by patient motion.  Aorta iliac stent graft is noted.  Moderate to severe compression fracture of L1 has progressed since 2011.  There is mild superior endplate edema and enhancement suggesting there may be a recent component to the fracture.  There is retropulsion of bone into the canal causing mild stenosis but no compression of the conus medullaris.  Conus medullaris terminates at T12 and  has normal signal.  Fracture of the superior endplate of L3 appears chronic without bone marrow edema.  There is a small amount of enhancement of the superior endplate of L3 on the left posteriorly.  This may represent a Schmorl's node rather than a recent fracture.  There is a very large Schmorl's node in the inferior plate of L1 which shows surrounding fatty change and enhancement.  This does not appear to represent a fracture.  No evidence of metastatic disease.  T11-T12:  Mild spinal stenosis due to retropulsion of T12 into the canal.  T12-L1:  Disc degeneration and facet degeneration without  significant spinal stenosis.  L1-2:  Mild spinal stenosis with spondylosis and facet hypertrophy.  L2-3:  Spondylosis and facet hypertrophy with mild spinal stenosis.  L3-4:  Disc degeneration and spondylosis.  Bilateral facet hypertrophy.  There is mild spinal stenosis and mild foraminal stenosis bilaterally.  L4-5:  Advanced disc degeneration.  There is spondylosis causing foraminal encroachment bilaterally.  Small 2 x 4 mm cystic structure along the right L5 nerve root may be a perineural cyst. This appears more anterior than a synovial cyst of the facet would be expected.  L5-S1:  Mild disc degeneration and spondylosis.  IMPRESSION: Severe compression fracture T12.  There was a mild fracture in 2011 and this has progressed.  There may be a recent component to the fracture with mild bone marrow edema and enhancement present. Mild retropulsion of T12 into the spinal canal causing mild spinal stenosis.  Compression fracture of L3 appears chronic.  Small amount of enhancement in the L3 vertebral body left may be a Schmorl's node.  Large Schmorl's node at L1.  Multilevel spondylosis as described above.   Original Report Authenticated By: Camelia Phenes, M.D.     Microbiology: Recent Results (from the past 240 hour(s))  URINE CULTURE     Status: Normal   Collection Time   08/21/12  9:40 AM      Component Value Range Status Comment   Specimen Description URINE, CLEAN CATCH   Final    Special Requests NONE   Final    Culture  Setup Time 08/21/2012 11:00   Final    Colony Count >=100,000 COLONIES/ML   Final    Culture ESCHERICHIA COLI   Final    Report Status 08/23/2012 FINAL   Final    Organism  ID, Bacteria ESCHERICHIA COLI   Final      Labs: Results for orders placed during the hospital encounter of 08/20/12  CBC WITH DIFFERENTIAL      Component Value Range   WBC 10.6 (*) 4.0 - 10.5 K/uL   RBC 3.94 (*) 4.22 - 5.81 MIL/uL   Hemoglobin 11.3 (*) 13.0 - 17.0 g/dL   HCT 16.1 (*) 09.6 - 04.5 %   MCV  86.3  78.0 - 100.0 fL   MCH 28.7  26.0 - 34.0 pg   MCHC 33.2  30.0 - 36.0 g/dL   RDW 40.9  81.1 - 91.4 %   Platelets 124 (*) 150 - 400 K/uL   Neutrophils Relative 70  43 - 77 %   Neutro Abs 7.4  1.7 - 7.7 K/uL   Lymphocytes Relative 17  12 - 46 %   Lymphs Abs 1.8  0.7 - 4.0 K/uL   Monocytes Relative 8  3 - 12 %   Monocytes Absolute 0.9  0.1 - 1.0 K/uL   Eosinophils Relative 5  0 - 5 %   Eosinophils Absolute 0.6  0.0 - 0.7 K/uL   Basophils Relative 0  0 - 1 %   Basophils Absolute 0.0  0.0 - 0.1 K/uL  COMPREHENSIVE METABOLIC PANEL      Component Value Range   Sodium 135  135 - 145 mEq/L   Potassium 4.7  3.5 - 5.1 mEq/L   Chloride 100  96 - 112 mEq/L   CO2 23  19 - 32 mEq/L   Glucose, Bld 102 (*) 70 - 99 mg/dL   BUN 37 (*) 6 - 23 mg/dL   Creatinine, Ser 7.82 (*) 0.50 - 1.35 mg/dL   Calcium 9.0  8.4 - 95.6 mg/dL   Total Protein 6.4  6.0 - 8.3 g/dL   Albumin 3.4 (*) 3.5 - 5.2 g/dL   AST 23  0 - 37 U/L   ALT 10  0 - 53 U/L   Alkaline Phosphatase 97  39 - 117 U/L   Total Bilirubin 0.4  0.3 - 1.2 mg/dL   GFR calc non Af Amer 39 (*) >90 mL/min   GFR calc Af Amer 46 (*) >90 mL/min  URINALYSIS, ROUTINE W REFLEX MICROSCOPIC      Component Value Range   Color, Urine YELLOW  YELLOW   APPearance CLEAR  CLEAR   Specific Gravity, Urine 1.015  1.005 - 1.030   pH 6.0  5.0 - 8.0   Glucose, UA NEGATIVE  NEGATIVE mg/dL   Hgb urine dipstick NEGATIVE  NEGATIVE   Bilirubin Urine NEGATIVE  NEGATIVE   Ketones, ur NEGATIVE  NEGATIVE mg/dL   Protein, ur NEGATIVE  NEGATIVE mg/dL   Urobilinogen, UA 1.0  0.0 - 1.0 mg/dL   Nitrite POSITIVE (*) NEGATIVE   Leukocytes, UA MODERATE (*) NEGATIVE  PROTIME-INR      Component Value Range   Prothrombin Time 14.1  11.6 - 15.2 seconds   INR 1.10  0.00 - 1.49  APTT      Component Value Range   aPTT 36  24 - 37 seconds  CBC      Component Value Range   WBC 8.2  4.0 - 10.5 K/uL   RBC 3.62 (*) 4.22 - 5.81 MIL/uL   Hemoglobin 10.3 (*) 13.0 - 17.0 g/dL    HCT 21.3 (*) 08.6 - 52.0 %   MCV 85.9  78.0 - 100.0 fL   MCH 28.5  26.0 - 34.0 pg  MCHC 33.1  30.0 - 36.0 g/dL   RDW 16.1  09.6 - 04.5 %   Platelets    150 - 400 K/uL   Value: PLATELET CLUMPING, SUGGEST RECOLLECTION OF SAMPLE IN CITRATE TUBE.  BASIC METABOLIC PANEL      Component Value Range   Sodium 140  135 - 145 mEq/L   Potassium 3.7  3.5 - 5.1 mEq/L   Chloride 105  96 - 112 mEq/L   CO2 29  19 - 32 mEq/L   Glucose, Bld 111 (*) 70 - 99 mg/dL   BUN 26 (*) 6 - 23 mg/dL   Creatinine, Ser 4.09  0.50 - 1.35 mg/dL   Calcium 9.0  8.4 - 81.1 mg/dL   GFR calc non Af Amer 51 (*) >90 mL/min   GFR calc Af Amer 59 (*) >90 mL/min  CBC      Component Value Range   WBC 8.6  4.0 - 10.5 K/uL   RBC 3.85 (*) 4.22 - 5.81 MIL/uL   Hemoglobin 10.8 (*) 13.0 - 17.0 g/dL   HCT 91.4 (*) 78.2 - 95.6 %   MCV 85.7  78.0 - 100.0 fL   MCH 28.1  26.0 - 34.0 pg   MCHC 32.7  30.0 - 36.0 g/dL   RDW 21.3  08.6 - 57.8 %   Platelets PLATELETS APPEAR ADEQUATE  150 - 400 K/uL  CREATININE, SERUM      Component Value Range   Creatinine, Ser 1.40 (*) 0.50 - 1.35 mg/dL   GFR calc non Af Amer 44 (*) >90 mL/min   GFR calc Af Amer 51 (*) >90 mL/min  URINE MICROSCOPIC-ADD ON      Component Value Range   Squamous Epithelial / LPF RARE  RARE   WBC, UA 11-20  <3 WBC/hpf   RBC / HPF 0-2  <3 RBC/hpf   Bacteria, UA MANY (*) RARE   Urine-Other Some WBC'S in clumps    URINE CULTURE      Component Value Range   Specimen Description URINE, CLEAN CATCH     Special Requests NONE     Culture  Setup Time 08/21/2012 11:00     Colony Count >=100,000 COLONIES/ML     Culture ESCHERICHIA COLI     Report Status 08/23/2012 FINAL     Organism ID, Bacteria ESCHERICHIA COLI    BASIC METABOLIC PANEL      Component Value Range   Sodium 136  135 - 145 mEq/L   Potassium 3.8  3.5 - 5.1 mEq/L   Chloride 102  96 - 112 mEq/L   CO2 26  19 - 32 mEq/L   Glucose, Bld 144 (*) 70 - 99 mg/dL   BUN 16  6 - 23 mg/dL   Creatinine, Ser 4.69   0.50 - 1.35 mg/dL   Calcium 9.1  8.4 - 62.9 mg/dL   GFR calc non Af Amer 64 (*) >90 mL/min   GFR calc Af Amer 74 (*) >90 mL/min  CBC WITH DIFFERENTIAL      Component Value Range   WBC 12.3 (*) 4.0 - 10.5 K/uL   RBC 4.03 (*) 4.22 - 5.81 MIL/uL   Hemoglobin 11.7 (*) 13.0 - 17.0 g/dL   HCT 52.8 (*) 41.3 - 24.4 %   MCV 85.9  78.0 - 100.0 fL   MCH 29.0  26.0 - 34.0 pg   MCHC 33.8  30.0 - 36.0 g/dL   RDW 01.0  27.2 - 53.6 %   Platelets  150 - 400 K/uL   Value: PLATELET CLUMPS NOTED ON SMEAR, COUNT APPEARS ADEQUATE   Neutrophils Relative 76  43 - 77 %   Neutro Abs 9.3 (*) 1.7 - 7.7 K/uL   Lymphocytes Relative 12  12 - 46 %   Lymphs Abs 1.5  0.7 - 4.0 K/uL   Monocytes Relative 10  3 - 12 %   Monocytes Absolute 1.2 (*) 0.1 - 1.0 K/uL   Eosinophils Relative 2  0 - 5 %   Eosinophils Absolute 0.3  0.0 - 0.7 K/uL   Basophils Relative 0  0 - 1 %   Basophils Absolute 0.0  0.0 - 0.1 K/uL  MAGNESIUM      Component Value Range   Magnesium 2.0  1.5 - 2.5 mg/dL  PHOSPHORUS      Component Value Range   Phosphorus 2.7  2.3 - 4.6 mg/dL  BASIC METABOLIC PANEL      Component Value Range   Sodium 136  135 - 145 mEq/L   Potassium 3.6  3.5 - 5.1 mEq/L   Chloride 100  96 - 112 mEq/L   CO2 26  19 - 32 mEq/L   Glucose, Bld 122 (*) 70 - 99 mg/dL   BUN 14  6 - 23 mg/dL   Creatinine, Ser 1.61  0.50 - 1.35 mg/dL   Calcium 9.4  8.4 - 09.6 mg/dL   GFR calc non Af Amer 66 (*) >90 mL/min   GFR calc Af Amer 76 (*) >90 mL/min  GLUCOSE, CAPILLARY      Component Value Range   Glucose-Capillary 116 (*) 70 - 99 mg/dL   Comment 1 Notify RN    GLUCOSE, CAPILLARY      Component Value Range   Glucose-Capillary 109 (*) 70 - 99 mg/dL  CBC WITH DIFFERENTIAL      Component Value Range   WBC 9.9  4.0 - 10.5 K/uL   RBC 4.24  4.22 - 5.81 MIL/uL   Hemoglobin 12.1 (*) 13.0 - 17.0 g/dL   HCT 04.5 (*) 40.9 - 81.1 %   MCV 85.6  78.0 - 100.0 fL   MCH 28.5  26.0 - 34.0 pg   MCHC 33.3  30.0 - 36.0 g/dL   RDW 91.4   78.2 - 95.6 %   Platelets PLATELET CLUMPS NOTED ON SMEAR, UNABLE TO ESTIMATE  150 - 400 K/uL   Neutrophils Relative 67  43 - 77 %   Neutro Abs 6.6  1.7 - 7.7 K/uL   Lymphocytes Relative 18  12 - 46 %   Lymphs Abs 1.8  0.7 - 4.0 K/uL   Monocytes Relative 10  3 - 12 %   Monocytes Absolute 1.0  0.1 - 1.0 K/uL   Eosinophils Relative 5  0 - 5 %   Eosinophils Absolute 0.5  0.0 - 0.7 K/uL   Basophils Relative 0  0 - 1 %   Basophils Absolute 0.0  0.0 - 0.1 K/uL  BASIC METABOLIC PANEL      Component Value Range   Sodium 140  135 - 145 mEq/L   Potassium 3.7  3.5 - 5.1 mEq/L   Chloride 103  96 - 112 mEq/L   CO2 27  19 - 32 mEq/L   Glucose, Bld 123 (*) 70 - 99 mg/dL   BUN 18  6 - 23 mg/dL   Creatinine, Ser 2.13  0.50 - 1.35 mg/dL   Calcium 9.3  8.4 - 08.6 mg/dL   GFR calc non  Af Amer 58 (*) >90 mL/min   GFR calc Af Amer 67 (*) >90 mL/min  URINALYSIS, ROUTINE W REFLEX MICROSCOPIC      Component Value Range   Color, Urine YELLOW  YELLOW   APPearance CLOUDY (*) CLEAR   Specific Gravity, Urine 1.012  1.005 - 1.030   pH 5.5  5.0 - 8.0   Glucose, UA NEGATIVE  NEGATIVE mg/dL   Hgb urine dipstick NEGATIVE  NEGATIVE   Bilirubin Urine NEGATIVE  NEGATIVE   Ketones, ur NEGATIVE  NEGATIVE mg/dL   Protein, ur NEGATIVE  NEGATIVE mg/dL   Urobilinogen, UA 1.0  0.0 - 1.0 mg/dL   Nitrite NEGATIVE  NEGATIVE   Leukocytes, UA LARGE (*) NEGATIVE  URINE MICROSCOPIC-ADD ON      Component Value Range   Squamous Epithelial / LPF FEW (*) RARE   WBC, UA TOO NUMEROUS TO COUNT  <3 WBC/hpf   RBC / HPF 0-2  <3 RBC/hpf   Bacteria, UA RARE  RARE  CBC WITH DIFFERENTIAL      Component Value Range   WBC 9.1  4.0 - 10.5 K/uL   RBC 4.10 (*) 4.22 - 5.81 MIL/uL   Hemoglobin 11.6 (*) 13.0 - 17.0 g/dL   HCT 46.9 (*) 62.9 - 52.8 %   MCV 85.1  78.0 - 100.0 fL   MCH 28.3  26.0 - 34.0 pg   MCHC 33.2  30.0 - 36.0 g/dL   RDW 41.3  24.4 - 01.0 %   Platelets 193  150 - 400 K/uL   Neutrophils Relative 58  43 - 77 %   Neutro  Abs 5.3  1.7 - 7.7 K/uL   Lymphocytes Relative 22  12 - 46 %   Lymphs Abs 2.0  0.7 - 4.0 K/uL   Monocytes Relative 7  3 - 12 %   Monocytes Absolute 0.7  0.1 - 1.0 K/uL   Eosinophils Relative 12 (*) 0 - 5 %   Eosinophils Absolute 1.1 (*) 0.0 - 0.7 K/uL   Basophils Relative 0  0 - 1 %   Basophils Absolute 0.0  0.0 - 0.1 K/uL  BASIC METABOLIC PANEL      Component Value Range   Sodium 136  135 - 145 mEq/L   Potassium 3.6  3.5 - 5.1 mEq/L   Chloride 101  96 - 112 mEq/L   CO2 27  19 - 32 mEq/L   Glucose, Bld 101 (*) 70 - 99 mg/dL   BUN 20  6 - 23 mg/dL   Creatinine, Ser 2.72  0.50 - 1.35 mg/dL   Calcium 9.1  8.4 - 53.6 mg/dL   GFR calc non Af Amer 53 (*) >90 mL/min   GFR calc Af Amer 62 (*) >90 mL/min  URINALYSIS, ROUTINE W REFLEX MICROSCOPIC      Component Value Range   Color, Urine YELLOW  YELLOW   APPearance CLEAR  CLEAR   Specific Gravity, Urine 1.014  1.005 - 1.030   pH 5.5  5.0 - 8.0   Glucose, UA NEGATIVE  NEGATIVE mg/dL   Hgb urine dipstick NEGATIVE  NEGATIVE   Bilirubin Urine NEGATIVE  NEGATIVE   Ketones, ur NEGATIVE  NEGATIVE mg/dL   Protein, ur NEGATIVE  NEGATIVE mg/dL   Urobilinogen, UA 0.2  0.0 - 1.0 mg/dL   Nitrite NEGATIVE  NEGATIVE   Leukocytes, UA NEGATIVE  NEGATIVE     Signed: Deara Bober Allen 08/27/2012, 8:00 AM

## 2012-09-11 ENCOUNTER — Ambulatory Visit (HOSPITAL_COMMUNITY)
Admit: 2012-09-11 | Discharge: 2012-09-11 | Disposition: A | Discharge status: Home / Self Care | Payer: Medicare Other | Attending: Interventional Radiology | Admitting: Interventional Radiology

## 2012-10-04 ENCOUNTER — Emergency Department (HOSPITAL_COMMUNITY)
Admission: EM | Admit: 2012-10-04 | Discharge: 2012-10-04 | Disposition: A | Discharge status: Home / Self Care | Payer: PRIVATE HEALTH INSURANCE | Attending: Emergency Medicine | Admitting: Emergency Medicine

## 2012-10-04 ENCOUNTER — Encounter (HOSPITAL_COMMUNITY): Payer: Self-pay | Admitting: Emergency Medicine

## 2012-10-04 ENCOUNTER — Emergency Department (HOSPITAL_COMMUNITY): Payer: PRIVATE HEALTH INSURANCE

## 2012-10-04 DIAGNOSIS — Z8349 Family history of other endocrine, nutritional and metabolic diseases: Secondary | ICD-10-CM | POA: Insufficient documentation

## 2012-10-04 DIAGNOSIS — Z8659 Personal history of other mental and behavioral disorders: Secondary | ICD-10-CM | POA: Insufficient documentation

## 2012-10-04 DIAGNOSIS — G8929 Other chronic pain: Secondary | ICD-10-CM | POA: Insufficient documentation

## 2012-10-04 DIAGNOSIS — S0181XA Laceration without foreign body of other part of head, initial encounter: Secondary | ICD-10-CM

## 2012-10-04 DIAGNOSIS — Z923 Personal history of irradiation: Secondary | ICD-10-CM | POA: Insufficient documentation

## 2012-10-04 DIAGNOSIS — E039 Hypothyroidism, unspecified: Secondary | ICD-10-CM | POA: Insufficient documentation

## 2012-10-04 DIAGNOSIS — S50811A Abrasion of right forearm, initial encounter: Secondary | ICD-10-CM

## 2012-10-04 DIAGNOSIS — Z8739 Personal history of other diseases of the musculoskeletal system and connective tissue: Secondary | ICD-10-CM | POA: Insufficient documentation

## 2012-10-04 DIAGNOSIS — Y939 Activity, unspecified: Secondary | ICD-10-CM | POA: Insufficient documentation

## 2012-10-04 DIAGNOSIS — E7800 Pure hypercholesterolemia, unspecified: Secondary | ICD-10-CM | POA: Insufficient documentation

## 2012-10-04 DIAGNOSIS — I10 Essential (primary) hypertension: Secondary | ICD-10-CM | POA: Insufficient documentation

## 2012-10-04 DIAGNOSIS — Z8546 Personal history of malignant neoplasm of prostate: Secondary | ICD-10-CM | POA: Insufficient documentation

## 2012-10-04 DIAGNOSIS — Z7982 Long term (current) use of aspirin: Secondary | ICD-10-CM | POA: Insufficient documentation

## 2012-10-04 DIAGNOSIS — J4489 Other specified chronic obstructive pulmonary disease: Secondary | ICD-10-CM | POA: Insufficient documentation

## 2012-10-04 DIAGNOSIS — W19XXXA Unspecified fall, initial encounter: Secondary | ICD-10-CM

## 2012-10-04 DIAGNOSIS — Z79899 Other long term (current) drug therapy: Secondary | ICD-10-CM | POA: Insufficient documentation

## 2012-10-04 DIAGNOSIS — J449 Chronic obstructive pulmonary disease, unspecified: Secondary | ICD-10-CM | POA: Insufficient documentation

## 2012-10-04 DIAGNOSIS — S022XXA Fracture of nasal bones, initial encounter for closed fracture: Secondary | ICD-10-CM

## 2012-10-04 DIAGNOSIS — IMO0002 Reserved for concepts with insufficient information to code with codable children: Secondary | ICD-10-CM | POA: Insufficient documentation

## 2012-10-04 DIAGNOSIS — Z87448 Personal history of other diseases of urinary system: Secondary | ICD-10-CM | POA: Insufficient documentation

## 2012-10-04 DIAGNOSIS — Z8679 Personal history of other diseases of the circulatory system: Secondary | ICD-10-CM | POA: Insufficient documentation

## 2012-10-04 DIAGNOSIS — G4733 Obstructive sleep apnea (adult) (pediatric): Secondary | ICD-10-CM | POA: Insufficient documentation

## 2012-10-04 DIAGNOSIS — Z87891 Personal history of nicotine dependence: Secondary | ICD-10-CM | POA: Insufficient documentation

## 2012-10-04 DIAGNOSIS — Y92009 Unspecified place in unspecified non-institutional (private) residence as the place of occurrence of the external cause: Secondary | ICD-10-CM | POA: Insufficient documentation

## 2012-10-04 DIAGNOSIS — S01501A Unspecified open wound of lip, initial encounter: Secondary | ICD-10-CM | POA: Insufficient documentation

## 2012-10-04 DIAGNOSIS — Z9181 History of falling: Secondary | ICD-10-CM | POA: Insufficient documentation

## 2012-10-04 DIAGNOSIS — I2510 Atherosclerotic heart disease of native coronary artery without angina pectoris: Secondary | ICD-10-CM | POA: Insufficient documentation

## 2012-10-04 DIAGNOSIS — W1809XA Striking against other object with subsequent fall, initial encounter: Secondary | ICD-10-CM | POA: Insufficient documentation

## 2012-10-04 MED ORDER — CLINDAMYCIN HCL 150 MG PO CAPS: ORAL_CAPSULE | ORAL | Status: DC

## 2012-10-04 MED ORDER — FENTANYL CITRATE 0.05 MG/ML IJ SOLN
12.5000 ug | Freq: Once | INTRAMUSCULAR | Status: AC
  Administered 2012-10-04: 12.5 ug via INTRAMUSCULAR
  Filled 2012-10-04: qty 2

## 2012-10-04 NOTE — ED Provider Notes (Signed)
History   This chart was scribed for Laray Anger, DO by Logan Allen, ED Scribe. This patient was seen in room TR09C/TR09C and the patient's care was started at 12:56 PM    CSN: [id number]  Arrival date & time 10/04/12  1123   First MD Initiated Contact with Patient 10/04/12 1251      Chief Complaint  Patient presents with  . Laceration     The history is provided by the patient and a relative. No language interpreter was used.   Logan Allen is a 78 y.o. male with h/o CAD, HTN and COPD who presents to the Emergency Department complaining of sudden onset and resolution of one episode of slip and fall that occurred approx 0200 this morning PTA.  Pt states he sustained a laceration to his upper lip after slipping and hitting face against a bedside table.  Pt denies syncope or LOC and states he simply slipped. Family endorses pt has hx of frequent falls. Pt also has an abrasion on his right forearm which he applied a Band-Aid.  Denies any further injury.  Denies prodromal symptoms before fall. Denies neck or back pain, no CP/SOB, no abd pain, no focal motor weakness, no tingling/numbness in extremities.      Td UTD Past Medical History  Diagnosis Date  . Coronary artery disease   . Hypertension   . High cholesterol   . COPD (chronic obstructive pulmonary disease)   . Exertional dyspnea 08/20/2012  . History of blood transfusion 1994; 2004    "w/both bypass ORs"  . History of carotid endarterectomy 1994    right  . Recurrent falls     "last fall 08/15/2012" (08/20/2012)  . Arthritis     "I suffer all over my body; dr said it could be from arthritis" (08/20/2012)  . Chronic lower back pain   . Anxiety   . Mental disorder   . Prostate cancer 1988    radiation treatment  . Hypothyroidism   . OSA (obstructive sleep apnea)   . Gout     "thumb" (08/20/2012)  . Incontinence of urine     Past Surgical History  Procedure Date  . Appendectomy   . Cataract extraction w/  intraocular lens  implant, bilateral   . Coronary artery bypass graft 1994; 2004  . Prostatectomy   . Hernia repair     UHR  . Cardiac valve replacement 2004    AVR  . Orchiectomy 2001    bilateral  . Penile prosthesis  removal 2001  . Circumcision 2001     History  Substance Use Topics  . Smoking status: Former Smoker -- 2.5 packs/day for 55 years    Types: Cigarettes  . Smokeless tobacco: Never Used     Comment: 08/20/2012 "stopped smoking cigarettes ~ 1994"  . Alcohol Use: No      Review of Systems ROS: Statement: All systems negative except as marked or noted in the HPI; Constitutional: Negative for fever and chills. ; ; Eyes: Negative for eye pain, redness and discharge. ; ; ENMT: Negative for ear pain, hoarseness, nasal congestion, sinus pressure and sore throat. ; ; Cardiovascular: Negative for chest pain, palpitations, diaphoresis, dyspnea and peripheral edema. ; ; Respiratory: Negative for cough, wheezing and stridor. ; ; Gastrointestinal: Negative for nausea, vomiting, diarrhea, abdominal pain, blood in stool, hematemesis, jaundice and rectal bleeding.; ; Genitourinary: Negative for dysuria, flank pain and hematuria. ; ; Musculoskeletal: Negative for back pain and neck pain. Negative for  swelling and trauma.; ; Skin: +abrasion, laceration, bruising. Negative for pruritus, rash, blisters, and skin lesion.; ; Neuro: Negative for headache, lightheadedness and neck stiffness. Negative for weakness, altered level of consciousness , altered mental status, extremity weakness, paresthesias, involuntary movement, seizure and syncope.       Allergies  Penicillins and Percocet  Home Medications   Current Outpatient Rx  Name  Route  Sig  Dispense  Refill  . ALBUTEROL SULFATE (2.5 MG/3ML) 0.083% IN NEBU   Nebulization   Take 2.5 mg by nebulization every 4 (four) hours as needed. For shortness of breath         . ALLOPURINOL 100 MG PO TABS   Oral   Take 100 mg by mouth 2  (two) times daily.         . ASPIRIN EC 81 MG PO TBEC   Oral   Take 81 mg by mouth daily.         Marland Kitchen BICALUTAMIDE 50 MG PO TABS   Oral   Take 50 mg by mouth daily.         Marland Kitchen BIMATOPROST 0.01 % OP SOLN   Both Eyes   Place 1 drop into both eyes at bedtime.         Marland Kitchen CALCIUM CARBONATE-VITAMIN D 500-200 MG-UNIT PO TABS   Oral   Take 1 tablet by mouth 2 (two) times daily.         Marland Kitchen CILOSTAZOL 50 MG PO TABS   Oral   Take 50 mg by mouth 2 (two) times daily.         Marland Kitchen DIAZEPAM 10 MG PO TABS   Oral   Take 5 mg by mouth at bedtime.          . DONEPEZIL HCL 10 MG PO TABS   Oral   Take 10 mg by mouth at bedtime.         . DULOXETINE HCL 30 MG PO CPEP   Oral   Take 30 mg by mouth 2 (two) times daily.         . GUAIFENESIN ER 600 MG PO TB12   Oral   Take 600 mg by mouth every 12 (twelve) hours as needed. For cough/congestion         . HYDROCODONE-ACETAMINOPHEN 10-325 MG PO TABS   Oral   Take 1 tablet by mouth every 6 (six) hours. For pain         . METOPROLOL SUCCINATE ER 25 MG PO TB24   Oral   Take 25 mg by mouth daily.         Marland Kitchen POLYETHYLENE GLYCOL 3350 PO PACK   Oral   Take 17 g by mouth daily.         Marland Kitchen POTASSIUM CHLORIDE CRYS ER 20 MEQ PO TBCR   Oral   Take 20 mEq by mouth daily.         Marland Kitchen TIOTROPIUM BROMIDE MONOHYDRATE 18 MCG IN CAPS   Inhalation   Place 18 mcg into inhaler and inhale daily.         Marland Kitchen LIDOCAINE 5 % EX PTCH   Transdermal   Place 1 patch onto the skin daily. Remove & Discard patch within 12 hours or as directed by MD         . MEMANTINE HCL 10 MG PO TABS   Oral   Take 10 mg by mouth 2 (two) times daily.         Marland Kitchen TAMSULOSIN  HCL 0.4 MG PO CAPS   Oral   Take 0.4 mg by mouth at bedtime.           BP 144/76  Pulse 117  Temp 97.6 F (36.4 C) (Oral)  Resp 18  SpO2 96%  Physical Exam 1300: Physical examination: Vital signs and O2 SAT: Reviewed; Constitutional: Well developed, Well nourished, Well  hydrated, In no acute distress; Head and Face: Normocephalic, Atraumatic; Eyes: EOMI, PERRL, No scleral icterus; ENMT: +approx 2cm horizontal upper lip lac through and through.  No active bleeding. No intra-oral bleeding, teeth intact. No septal hematoma, no epistaxis. +ecchymosis and localized edema to bridge of nose without open wounds or deformity.  Facial bones NT. Mouth and pharynx normal, Left TM normal, Right TM normal, Mucous membranes moist; Neck: Supple. Trachea midline; Spine: No midline CS, TS, LS tenderness.; Cardiovascular: Regular rate and rhythm, No gallop; Respiratory: Breath sounds clear & equal bilaterally, No wheezes, Normal respiratory effort/excursion; Chest: Nontender, No deformity, Movement normal, No crepitus, No abrasions or ecchymosis.; Abdomen: Soft, Nontender, Nondistended, Normal bowel sounds, No abrasions or ecchymosis.;; Extremities: +very superifical hemostatic abrasion to right dorsal forearm without ecchymosis or edema. No deformity, Full range of motion major/large joints of bilat UE's and LE's without pain or tenderness to palp, Neurovascularly intact, Pulses normal, No tenderness, No edema, Pelvis stable; Neuro: AA&Ox3, Major CN grossly intact. Speech clear. No facial droop. No gross focal motor or sensory deficits in extremities.; Skin: Color normal, Warm, Dry   ED Course  LACERATION REPAIR Performed by: Teressa Lower Authorized by: Teressa Lower Consent: Verbal consent obtained. Risks and benefits: risks, benefits and alternatives were discussed Consent given by: patient Patient understanding: patient states understanding of the procedure being performed Patient consent: the patient's understanding of the procedure matches consent given Procedure consent: procedure consent matches procedure scheduled Patient identity confirmed: verbally with patient Time out: Immediately prior to procedure a "time out" was called to verify the correct patient, procedure,  equipment, support staff and site/side marked as required. Body area: head/neck Location details: lower lip Full thickness lip laceration: yes [place] border involved: no Laceration length: 2.5 cm Foreign bodies: no foreign bodies Anesthesia: nerve block (infraorbitial) Local anesthetic: lidocaine 1% without epinephrine Irrigation solution: saline Skin closure: 5-0 Prolene Subcutaneous closure: 5-0 Vicryl Number of sutures: 3 inside 8 outside. Technique: complex Approximation: close Patient tolerance: Patient tolerated the procedure well with no immediate complications.     Teressa Lower, NP 10/04/12 1545   MDM  MDM Reviewed: nursing note and vitals Interpretation: CT scan    Ct Head Wo Contrast 10/04/2012  *RADIOLOGY REPORT*  Clinical Data:  78 year old male with headache, facial pain and neck pain following fall and injury.  CT HEAD WITHOUT CONTRAST CT MAXILLOFACIAL WITHOUT CONTRAST CT CERVICAL SPINE WITHOUT CONTRAST  Technique:  Multidetector CT imaging of the head, cervical spine, and maxillofacial structures were performed using the standard protocol without intravenous contrast. Multiplanar CT image reconstructions of the cervical spine and maxillofacial structures were also generated.  Comparison:  05/31/2011 head CT, 06/27/2010 cervical spine and facial CT.  CT HEAD  Findings: Atrophy and chronic small vessel white matter ischemic changes are again noted.  No acute intracranial abnormalities are identified, including mass lesion or mass effect, hydrocephalus, extra-axial fluid collection, midline shift, hemorrhage, or acute infarction.  The visualized bony calvarium is unremarkable.  IMPRESSION: No evidence of acute intracranial abnormality.  Atrophy and chronic small vessel white matter ischemic changes.  CT MAXILLOFACIAL  Findings:  A minimally  displaced fracture of the nasal septum is present (image 49, series 7). No other fracture, subluxation or dislocation  identified. A laceration of the upper lip is present. The globes and orbits are within normal limits. The paranasal sinuses, mastoid air cells and middle/inner ears are clear.  IMPRESSION: Minimally displaced nasal septum fracture.  Soft tissue injury to the upper lip.  CT CERVICAL SPINE  Findings:   Normal alignment is noted.  There is no evidence of acute fracture, acute subluxation or prevertebral soft tissue swelling. Mild multilevel degenerative disc disease and facet arthropathy is noted. No focal bony lesions are present. No soft tissue abnormalities are identified.  IMPRESSION: No static evidence of acute injury to the cervical spine.   Original Report Authenticated By: Harmon Pier, M.D.    Ct Cervical Spine Wo Contrast 10/04/2012  *RADIOLOGY REPORT*  Clinical Data:  77 year old male with headache, facial pain and neck pain following fall and injury.  CT HEAD WITHOUT CONTRAST CT MAXILLOFACIAL WITHOUT CONTRAST CT CERVICAL SPINE WITHOUT CONTRAST  Technique:  Multidetector CT imaging of the head, cervical spine, and maxillofacial structures were performed using the standard protocol without intravenous contrast. Multiplanar CT image reconstructions of the cervical spine and maxillofacial structures were also generated.  Comparison:  05/31/2011 head CT, 06/27/2010 cervical spine and facial CT.  CT HEAD  Findings: Atrophy and chronic small vessel white matter ischemic changes are again noted.  No acute intracranial abnormalities are identified, including mass lesion or mass effect, hydrocephalus, extra-axial fluid collection, midline shift, hemorrhage, or acute infarction.  The visualized bony calvarium is unremarkable.  IMPRESSION: No evidence of acute intracranial abnormality.  Atrophy and chronic small vessel white matter ischemic changes.  CT MAXILLOFACIAL  Findings:  A minimally displaced fracture of the nasal septum is present (image 49, series 7). No other fracture, subluxation or dislocation identified.  A laceration of the upper lip is present. The globes and orbits are within normal limits. The paranasal sinuses, mastoid air cells and middle/inner ears are clear.  IMPRESSION: Minimally displaced nasal septum fracture.  Soft tissue injury to the upper lip.  CT CERVICAL SPINE  Findings:   Normal alignment is noted.  There is no evidence of acute fracture, acute subluxation or prevertebral soft tissue swelling. Mild multilevel degenerative disc disease and facet arthropathy is noted. No focal bony lesions are present. No soft tissue abnormalities are identified.  IMPRESSION: No static evidence of acute injury to the cervical spine.   Original Report Authenticated By: Harmon Pier, M.D.    Ct Maxillofacial Wo Cm 10/04/2012  *RADIOLOGY REPORT*  Clinical Data:  78 year old male with headache, facial pain and neck pain following fall and injury.  CT HEAD WITHOUT CONTRAST CT MAXILLOFACIAL WITHOUT CONTRAST CT CERVICAL SPINE WITHOUT CONTRAST  Technique:  Multidetector CT imaging of the head, cervical spine, and maxillofacial structures were performed using the standard protocol without intravenous contrast. Multiplanar CT image reconstructions of the cervical spine and maxillofacial structures were also generated.  Comparison:  05/31/2011 head CT, 06/27/2010 cervical spine and facial CT.  CT HEAD  Findings: Atrophy and chronic small vessel white matter ischemic changes are again noted.  No acute intracranial abnormalities are identified, including mass lesion or mass effect, hydrocephalus, extra-axial fluid collection, midline shift, hemorrhage, or acute infarction.  The visualized bony calvarium is unremarkable.  IMPRESSION: No evidence of acute intracranial abnormality.  Atrophy and chronic small vessel white matter ischemic changes.  CT MAXILLOFACIAL  Findings:  A minimally displaced fracture of the nasal septum is  present (image 49, series 7). No other fracture, subluxation or dislocation identified. A laceration of the  upper lip is present. The globes and orbits are within normal limits. The paranasal sinuses, mastoid air cells and middle/inner ears are clear.  IMPRESSION: Minimally displaced nasal septum fracture.  Soft tissue injury to the upper lip.  CT CERVICAL SPINE  Findings:   Normal alignment is noted.  There is no evidence of acute fracture, acute subluxation or prevertebral soft tissue swelling. Mild multilevel degenerative disc disease and facet arthropathy is noted. No focal bony lesions are present. No soft tissue abnormalities are identified.  IMPRESSION: No static evidence of acute injury to the cervical spine.   Original Report Authenticated By: Harmon Pier, M.D.      [phone]:  NP closed lac. Pt wants to go home now.  Dx and testing d/w pt and family.  Questions answered.  Verb understanding, agreeable to d/c home with outpt f/u.    Lac repair only was performed by the non-physician practitioner(s).  I personally evaluated the patient during the encounter. [facility] M    I personally performed the services described in this documentation, which was scribed in my presence. The recorded information has been reviewed and is accurate. Arlyce Circle Allison Quarry, DO 10/05/12 1247

## 2012-10-04 NOTE — ED Notes (Signed)
Pt. Stated, I fell this morning around 0200 and busted my upper lip.  Not sure if its gone all the way through.

## 2012-10-04 NOTE — ED Notes (Signed)
Patient transported to CT 

## 2012-10-04 NOTE — ED Notes (Signed)
Pt returned from CT °

## 2012-10-04 NOTE — ED Notes (Signed)
Pt being sutured by Rubin Payor, NP, pt tolerated procedure well, family at bedside

## 2013-01-26 ENCOUNTER — Emergency Department (HOSPITAL_COMMUNITY)
Admission: EM | Admit: 2013-01-26 | Discharge: 2013-01-26 | Disposition: A | Discharge status: Home / Self Care | Payer: Medicare Other | Attending: Emergency Medicine | Admitting: Emergency Medicine

## 2013-01-26 ENCOUNTER — Emergency Department (HOSPITAL_COMMUNITY): Payer: Medicare Other

## 2013-01-26 ENCOUNTER — Encounter (HOSPITAL_COMMUNITY): Payer: Self-pay | Admitting: Emergency Medicine

## 2013-01-26 DIAGNOSIS — Z923 Personal history of irradiation: Secondary | ICD-10-CM | POA: Insufficient documentation

## 2013-01-26 DIAGNOSIS — Z79899 Other long term (current) drug therapy: Secondary | ICD-10-CM | POA: Insufficient documentation

## 2013-01-26 DIAGNOSIS — J449 Chronic obstructive pulmonary disease, unspecified: Secondary | ICD-10-CM | POA: Insufficient documentation

## 2013-01-26 DIAGNOSIS — Z954 Presence of other heart-valve replacement: Secondary | ICD-10-CM | POA: Insufficient documentation

## 2013-01-26 DIAGNOSIS — Z8709 Personal history of other diseases of the respiratory system: Secondary | ICD-10-CM | POA: Insufficient documentation

## 2013-01-26 DIAGNOSIS — M545 Low back pain, unspecified: Secondary | ICD-10-CM | POA: Insufficient documentation

## 2013-01-26 DIAGNOSIS — Z9181 History of falling: Secondary | ICD-10-CM | POA: Insufficient documentation

## 2013-01-26 DIAGNOSIS — M109 Gout, unspecified: Secondary | ICD-10-CM | POA: Insufficient documentation

## 2013-01-26 DIAGNOSIS — M25559 Pain in unspecified hip: Secondary | ICD-10-CM | POA: Insufficient documentation

## 2013-01-26 DIAGNOSIS — J4489 Other specified chronic obstructive pulmonary disease: Secondary | ICD-10-CM | POA: Insufficient documentation

## 2013-01-26 DIAGNOSIS — Z7982 Long term (current) use of aspirin: Secondary | ICD-10-CM | POA: Insufficient documentation

## 2013-01-26 DIAGNOSIS — Z8546 Personal history of malignant neoplasm of prostate: Secondary | ICD-10-CM | POA: Insufficient documentation

## 2013-01-26 DIAGNOSIS — F411 Generalized anxiety disorder: Secondary | ICD-10-CM | POA: Insufficient documentation

## 2013-01-26 DIAGNOSIS — I2510 Atherosclerotic heart disease of native coronary artery without angina pectoris: Secondary | ICD-10-CM | POA: Insufficient documentation

## 2013-01-26 DIAGNOSIS — G4733 Obstructive sleep apnea (adult) (pediatric): Secondary | ICD-10-CM | POA: Insufficient documentation

## 2013-01-26 DIAGNOSIS — I10 Essential (primary) hypertension: Secondary | ICD-10-CM | POA: Insufficient documentation

## 2013-01-26 DIAGNOSIS — M25551 Pain in right hip: Secondary | ICD-10-CM

## 2013-01-26 DIAGNOSIS — Z5189 Encounter for other specified aftercare: Secondary | ICD-10-CM | POA: Insufficient documentation

## 2013-01-26 DIAGNOSIS — Z87891 Personal history of nicotine dependence: Secondary | ICD-10-CM | POA: Insufficient documentation

## 2013-01-26 DIAGNOSIS — G8929 Other chronic pain: Secondary | ICD-10-CM | POA: Insufficient documentation

## 2013-01-26 DIAGNOSIS — Z8639 Personal history of other endocrine, nutritional and metabolic disease: Secondary | ICD-10-CM | POA: Insufficient documentation

## 2013-01-26 DIAGNOSIS — Z8739 Personal history of other diseases of the musculoskeletal system and connective tissue: Secondary | ICD-10-CM | POA: Insufficient documentation

## 2013-01-26 DIAGNOSIS — Z862 Personal history of diseases of the blood and blood-forming organs and certain disorders involving the immune mechanism: Secondary | ICD-10-CM | POA: Insufficient documentation

## 2013-01-26 DIAGNOSIS — Z9889 Other specified postprocedural states: Secondary | ICD-10-CM | POA: Insufficient documentation

## 2013-01-26 DIAGNOSIS — Z87448 Personal history of other diseases of urinary system: Secondary | ICD-10-CM | POA: Insufficient documentation

## 2013-01-26 DIAGNOSIS — F489 Nonpsychotic mental disorder, unspecified: Secondary | ICD-10-CM | POA: Insufficient documentation

## 2013-01-26 MED ORDER — HYDROCODONE-ACETAMINOPHEN 5-325 MG PO TABS: 2.0000 | ORAL_TABLET | ORAL | Status: DC | PRN

## 2013-01-26 MED ORDER — HYDROCODONE-ACETAMINOPHEN 5-325 MG PO TABS
2.0000 | ORAL_TABLET | Freq: Once | ORAL | Status: AC
  Administered 2013-01-26: 2 via ORAL
  Filled 2013-01-26: qty 2

## 2013-01-26 MED ORDER — METHOCARBAMOL 500 MG PO TABS: 500.0000 mg | ORAL_TABLET | Freq: Two times a day (BID) | ORAL | Status: DC

## 2013-01-26 NOTE — ED Provider Notes (Signed)
History     CSN: [id number]  Arrival date & time 01/26/13  1117   First MD Initiated Contact with Patient 01/26/13 1151      Chief Complaint  Patient presents with  . Arthritis    (Consider location/radiation/quality/duration/timing/severity/associated sxs/prior treatment) Patient is a 79 y.o. male presenting with arthritis. The history is provided by the patient.  Arthritis   patient here complaining of chronic right hip pain x2 months worse for 2 days. Patient has home health but has been unable to get out of bed due to the severe pain. Saw his Dr. recently for same and placed on Ultram without relief. Denies any new injuries. Denies any change in his bowel or bladder function. Pain is sharp and localized to the middle facet and worse with movement. States was diagnosed with bursitis. Symptoms have been persistent and nothing makes them better.  Past Medical History  Diagnosis Date  . Coronary artery disease   . Hypertension   . High cholesterol   . COPD (chronic obstructive pulmonary disease)   . Exertional dyspnea 08/20/2012  . History of blood transfusion 1994; 2004    "w/both bypass ORs"  . History of carotid endarterectomy 1994    right  . Recurrent falls     "last fall 08/15/2012" (08/20/2012)  . Arthritis     "I suffer all over my body; dr said it could be from arthritis" (08/20/2012)  . Chronic lower back pain   . Anxiety   . Mental disorder   . Prostate cancer 1988    radiation treatment  . Hypothyroidism   . OSA (obstructive sleep apnea)   . Gout     "thumb" (08/20/2012)  . Incontinence of urine     Past Surgical History  Procedure Laterality Date  . Appendectomy    . Cataract extraction w/ intraocular lens  implant, bilateral    . Coronary artery bypass graft  1994; 2004  . Prostatectomy    . Hernia repair      UHR  . Cardiac valve replacement  2004    AVR  . Orchiectomy  2001    bilateral  . Penile prosthesis  removal  2001  . Circumcision   2001    No family history on file.  History  Substance Use Topics  . Smoking status: Former Smoker -- 2.50 packs/day for 55 years    Types: Cigarettes  . Smokeless tobacco: Never Used     Comment: 08/20/2012 "stopped smoking cigarettes ~ 1994"  . Alcohol Use: No      Review of Systems  Musculoskeletal: Positive for arthritis.  All other systems reviewed and are negative.    Allergies  Penicillins and Percocet  Home Medications   Current Outpatient Rx  Name  Route  Sig  Dispense  Refill  . allopurinol (ZYLOPRIM) 100 MG tablet   Oral   Take 100 mg by mouth 2 (two) times daily.         Marland Kitchen aspirin EC 81 MG tablet   Oral   Take 81 mg by mouth daily.         . bicalutamide (CASODEX) 50 MG tablet   Oral   Take 50 mg by mouth daily.         . bimatoprost (LUMIGAN) 0.01 % SOLN   Both Eyes   Place 1 drop into both eyes at bedtime.         . calcium-vitamin D (OSCAL WITH D) 500-200 MG-UNIT per tablet   Oral  Take 1 tablet by mouth 2 (two) times daily.         . cilostazol (PLETAL) 50 MG tablet   Oral   Take 50 mg by mouth 2 (two) times daily.         . clindamycin (CLEOCIN) 150 MG capsule      3 tabs PO TID x 7 days   63 capsule   0   . diazepam (VALIUM) 10 MG tablet   Oral   Take 5 mg by mouth at bedtime.          . donepezil (ARICEPT) 10 MG tablet   Oral   Take 10 mg by mouth at bedtime.         . DULoxetine (CYMBALTA) 30 MG capsule   Oral   Take 30 mg by mouth 2 (two) times daily.         Marland Kitchen guaiFENesin (MUCINEX) 600 MG 12 hr tablet   Oral   Take 600 mg by mouth every 12 (twelve) hours as needed. For cough/congestion         . HYDROcodone-acetaminophen (NORCO) 10-325 MG per tablet   Oral   Take 1 tablet by mouth every 6 (six) hours. For pain         . lidocaine (LIDODERM) 5 %   Transdermal   Place 1 patch onto the skin daily. Remove & Discard patch within 12 hours or as directed by MD         . memantine (NAMENDA) 10 MG  tablet   Oral   Take 10 mg by mouth 2 (two) times daily.         . metoprolol succinate (TOPROL-XL) 25 MG 24 hr tablet   Oral   Take 25 mg by mouth daily.         . potassium chloride SA (K-DUR,KLOR-CON) 20 MEQ tablet   Oral   Take 20 mEq by mouth daily.         . Tamsulosin HCl (FLOMAX) 0.4 MG CAPS   Oral   Take 0.4 mg by mouth at bedtime.           BP 157/65  Pulse 93  Temp(Src) 97.6 F (36.4 C)  SpO2 94%  Physical Exam  Nursing note and vitals reviewed. Constitutional: He is oriented to person, place, and time. He appears well-developed and well-nourished.  Non-toxic appearance. No distress.  HENT:  Head: Normocephalic and atraumatic.  Eyes: Conjunctivae, EOM and lids are normal. Pupils are equal, round, and reactive to light.  Neck: Normal range of motion. Neck supple. No tracheal deviation present. No mass present.  Cardiovascular: Normal rate, regular rhythm and normal heart sounds.  Exam reveals no gallop.   No murmur heard. Pulmonary/Chest: Effort normal and breath sounds normal. No stridor. No respiratory distress. He has no decreased breath sounds. He has no wheezes. He has no rhonchi. He has no rales.  Abdominal: Soft. Normal appearance and bowel sounds are normal. He exhibits no distension. There is no tenderness. There is no rebound and no CVA tenderness.  Musculoskeletal: Normal range of motion. He exhibits no edema and no tenderness.       Right hip: He exhibits no swelling and no deformity.  Tenderness with range of motion of the right hip. No shortening or rotation. Neurovascular status intact  Neurological: He is alert and oriented to person, place, and time. He has normal strength. No cranial nerve deficit or sensory deficit. GCS eye subscore is 4. GCS verbal subscore  is 5. GCS motor subscore is 6.  Skin: Skin is warm and dry. No abrasion and no rash noted.  Psychiatric: He has a normal mood and affect. His speech is normal and behavior is normal.     ED Course  Procedures (including critical care time)  Labs Reviewed - No data to display No results found.   No diagnosis found.    MDM  Patient given Vicodin here for pain. Patient has adequate home health and was instructed to follow with his doctor to arrange for home physical therapy.        Toy Baker, MD 01/26/13 1320

## 2013-01-26 NOTE — ED Notes (Signed)
[facility] contacted for transport 

## 2013-01-26 NOTE — ED Notes (Signed)
Per EMS pt came from home c/o arthritis pain to right hip. Pt lives alone and has home health nurse visit. Pt was laying in same position since yesterday 12pm unable to get up. Daughter called EMS today. Pt has refused nursing homes in the past but is unable to care for himself.

## 2014-05-23 ENCOUNTER — Emergency Department (HOSPITAL_COMMUNITY)
Admission: EM | Admit: 2014-05-23 | Discharge: 2014-05-23 | Disposition: A | Discharge status: Home / Self Care | Payer: Medicare HMO | Attending: Emergency Medicine | Admitting: Emergency Medicine

## 2014-05-23 ENCOUNTER — Encounter (HOSPITAL_COMMUNITY): Payer: Self-pay | Admitting: Emergency Medicine

## 2014-05-23 ENCOUNTER — Emergency Department (HOSPITAL_COMMUNITY): Payer: Medicare HMO

## 2014-05-23 DIAGNOSIS — IMO0002 Reserved for concepts with insufficient information to code with codable children: Secondary | ICD-10-CM | POA: Diagnosis not present

## 2014-05-23 DIAGNOSIS — Z9889 Other specified postprocedural states: Secondary | ICD-10-CM | POA: Diagnosis not present

## 2014-05-23 DIAGNOSIS — F411 Generalized anxiety disorder: Secondary | ICD-10-CM | POA: Insufficient documentation

## 2014-05-23 DIAGNOSIS — Z8669 Personal history of other diseases of the nervous system and sense organs: Secondary | ICD-10-CM | POA: Diagnosis not present

## 2014-05-23 DIAGNOSIS — Z79899 Other long term (current) drug therapy: Secondary | ICD-10-CM | POA: Insufficient documentation

## 2014-05-23 DIAGNOSIS — J449 Chronic obstructive pulmonary disease, unspecified: Secondary | ICD-10-CM | POA: Insufficient documentation

## 2014-05-23 DIAGNOSIS — J4489 Other specified chronic obstructive pulmonary disease: Secondary | ICD-10-CM | POA: Insufficient documentation

## 2014-05-23 DIAGNOSIS — I10 Essential (primary) hypertension: Secondary | ICD-10-CM | POA: Diagnosis not present

## 2014-05-23 DIAGNOSIS — Z87891 Personal history of nicotine dependence: Secondary | ICD-10-CM | POA: Insufficient documentation

## 2014-05-23 DIAGNOSIS — M109 Gout, unspecified: Secondary | ICD-10-CM | POA: Insufficient documentation

## 2014-05-23 DIAGNOSIS — S0990XA Unspecified injury of head, initial encounter: Secondary | ICD-10-CM | POA: Diagnosis present

## 2014-05-23 DIAGNOSIS — Y9389 Activity, other specified: Secondary | ICD-10-CM | POA: Diagnosis not present

## 2014-05-23 DIAGNOSIS — Z8546 Personal history of malignant neoplasm of prostate: Secondary | ICD-10-CM | POA: Diagnosis not present

## 2014-05-23 DIAGNOSIS — Z951 Presence of aortocoronary bypass graft: Secondary | ICD-10-CM | POA: Diagnosis not present

## 2014-05-23 DIAGNOSIS — F489 Nonpsychotic mental disorder, unspecified: Secondary | ICD-10-CM | POA: Insufficient documentation

## 2014-05-23 DIAGNOSIS — Z880 Allergy status to penicillin: Secondary | ICD-10-CM | POA: Insufficient documentation

## 2014-05-23 DIAGNOSIS — Z791 Long term (current) use of non-steroidal anti-inflammatories (NSAID): Secondary | ICD-10-CM | POA: Insufficient documentation

## 2014-05-23 DIAGNOSIS — Y929 Unspecified place or not applicable: Secondary | ICD-10-CM | POA: Diagnosis not present

## 2014-05-23 DIAGNOSIS — G8929 Other chronic pain: Secondary | ICD-10-CM | POA: Insufficient documentation

## 2014-05-23 DIAGNOSIS — Z9181 History of falling: Secondary | ICD-10-CM | POA: Diagnosis not present

## 2014-05-23 DIAGNOSIS — S0190XA Unspecified open wound of unspecified part of head, initial encounter: Secondary | ICD-10-CM | POA: Insufficient documentation

## 2014-05-23 DIAGNOSIS — Z7982 Long term (current) use of aspirin: Secondary | ICD-10-CM | POA: Diagnosis not present

## 2014-05-23 DIAGNOSIS — Z23 Encounter for immunization: Secondary | ICD-10-CM | POA: Diagnosis not present

## 2014-05-23 DIAGNOSIS — I2510 Atherosclerotic heart disease of native coronary artery without angina pectoris: Secondary | ICD-10-CM | POA: Diagnosis not present

## 2014-05-23 DIAGNOSIS — W1809XA Striking against other object with subsequent fall, initial encounter: Secondary | ICD-10-CM | POA: Insufficient documentation

## 2014-05-23 MED ORDER — TETANUS-DIPHTH-ACELL PERTUSSIS 5-2.5-18.5 LF-MCG/0.5 IM SUSP
0.5000 mL | Freq: Once | INTRAMUSCULAR | Status: AC
  Administered 2014-05-23: 0.5 mL via INTRAMUSCULAR
  Filled 2014-05-23: qty 0.5

## 2014-05-23 NOTE — ED Provider Notes (Signed)
CSN: [id number]     Arrival date & time 05/23/14  1334 History   First MD Initiated Contact with Patient 05/23/14 1614     Chief Complaint  Patient presents with  . Fall  . Head Laceration     (Consider location/radiation/quality/duration/timing/severity/associated sxs/prior Treatment) Patient is a 80 y.o. male presenting with fall and scalp laceration.  Fall This is a new problem. The current episode started today. The problem occurs rarely. The problem has been resolved. Pertinent negatives include no abdominal pain, chest pain, chills, congestion, coughing, fever, headaches, nausea, neck pain, rash, sore throat or vomiting. Nothing aggravates the symptoms. He has tried nothing for the symptoms.  Head Laceration Pertinent negatives include no abdominal pain, chest pain, chills, congestion, coughing, fever, headaches, nausea, neck pain, rash, sore throat or vomiting.    Past Medical History  Diagnosis Date  . Coronary artery disease   . Hypertension   . High cholesterol   . COPD (chronic obstructive pulmonary disease)   . Exertional dyspnea 08/20/2012  . History of blood transfusion 1994; 2004    "w/both bypass ORs"  . History of carotid endarterectomy 1994    right  . Recurrent falls     "last fall 08/15/2012" (08/20/2012)  . Arthritis     "I suffer all over my body; dr said it could be from arthritis" (08/20/2012)  . Chronic lower back pain   . Anxiety   . Mental disorder   . Prostate cancer 1988    radiation treatment  . Hypothyroidism   . OSA (obstructive sleep apnea)   . Gout     "thumb" (08/20/2012)  . Incontinence of urine    Past Surgical History  Procedure Laterality Date  . Appendectomy    . Cataract extraction w/ intraocular lens  implant, bilateral    . Coronary artery bypass graft  1994; 2004  . Prostatectomy    . Hernia repair      UHR  . Cardiac valve replacement  2004    AVR  . Orchiectomy  2001    bilateral  . Penile prosthesis  removal  2001    . Circumcision  2001   No family history on file. History  Substance Use Topics  . Smoking status: Former Smoker -- 2.50 packs/day for 55 years    Types: Cigarettes  . Smokeless tobacco: Never Used     Comment: 08/20/2012 "stopped smoking cigarettes ~ 1994"  . Alcohol Use: No    Review of Systems  Constitutional: Negative for fever and chills.  HENT: Negative for congestion and sore throat.   Eyes: Negative for pain.  Respiratory: Negative for cough and shortness of breath.   Cardiovascular: Negative for chest pain.  Gastrointestinal: Negative for nausea, vomiting and abdominal pain.  Genitourinary: Negative for dysuria and flank pain.  Musculoskeletal: Negative for back pain and neck pain.  Skin: Negative for rash.  Neurological: Negative for seizures and headaches.      Allergies  Percocet and Penicillins  Home Medications   Prior to Admission medications   Medication Sig Start Date End Date Taking? Authorizing Provider  allopurinol (ZYLOPRIM) 100 MG tablet Take 100 mg by mouth 2 (two) times daily.   Yes Historical Provider, MD  aspirin EC 81 MG tablet Take 81 mg by mouth daily.   Yes Historical Provider, MD  bicalutamide (CASODEX) 50 MG tablet Take 50 mg by mouth daily.   Yes Historical Provider, MD  bimatoprost (LUMIGAN) 0.01 % SOLN Place 1 drop into both  eyes at bedtime.   Yes Historical Provider, MD  budesonide (PULMICORT) 180 MCG/ACT inhaler Inhale 2 puffs into the lungs 2 (two) times daily.   Yes Historical Provider, MD  calcium-vitamin D (OSCAL WITH D) 500-200 MG-UNIT per tablet Take 1 tablet by mouth 2 (two) times daily.   Yes Historical Provider, MD  cilostazol (PLETAL) 50 MG tablet Take 50 mg by mouth 2 (two) times daily.   Yes Historical Provider, MD  clonazePAM (KLONOPIN) 2 MG tablet Take 2 mg by mouth at bedtime.   Yes Historical Provider, MD  donepezil (ARICEPT) 10 MG tablet Take 10 mg by mouth at bedtime.   Yes Historical Provider, MD  guaiFENesin  (MUCINEX) 600 MG 12 hr tablet Take 600 mg by mouth 2 (two) times daily. For cough/congestion   Yes Historical Provider, MD  HYDROcodone-acetaminophen (NORCO/VICODIN) 5-325 MG per tablet Take 1 tablet by mouth every 8 (eight) hours.   Yes Historical Provider, MD  MELATONIN PO Take 1 tablet by mouth at bedtime.   Yes Historical Provider, MD  meloxicam (MOBIC) 15 MG tablet Take 15 mg by mouth daily.   Yes Historical Provider, MD  memantine (NAMENDA) 10 MG tablet Take 10 mg by mouth 2 (two) times daily.   Yes Historical Provider, MD  metoprolol succinate (TOPROL-XL) 25 MG 24 hr tablet Take 25 mg by mouth daily.   Yes Historical Provider, MD  oxybutynin (DITROPAN) 5 MG tablet Take 5 mg by mouth at bedtime.   Yes Historical Provider, MD  potassium chloride SA (K-DUR,KLOR-CON) 20 MEQ tablet Take 20 mEq by mouth daily.   Yes Historical Provider, MD  venlafaxine XR (EFFEXOR-XR) 75 MG 24 hr capsule Take 75 mg by mouth daily.   Yes Historical Provider, MD  vitamin B-12 (CYANOCOBALAMIN) 1000 MCG tablet Take 1,000 mcg by mouth daily.   Yes Historical Provider, MD   BP 164/64  Pulse 70  Temp(Src) 97.7 F (36.5 C) (Oral)  Resp 16  SpO2 92% Physical Exam  Constitutional: He is oriented to person, place, and time. He appears well-developed and well-nourished. No distress.  HENT:  Head: Normocephalic. Head is with laceration.    Eyes: Pupils are equal, round, and reactive to light.  Neck: Normal range of motion.  Cardiovascular: Normal rate and regular rhythm.   Pulmonary/Chest: Effort normal and breath sounds normal.  Abdominal: Soft. He exhibits no distension. There is no tenderness.  Musculoskeletal: Normal range of motion.  Neurological: He is alert and oriented to person, place, and time.  Skin: Skin is warm. He is not diaphoretic.    ED Course  Procedures (including critical care time) Labs Review Labs Reviewed - No data to display  Imaging Review Ct Head Wo Contrast  05/23/2014    CLINICAL DATA:  [place].  Hit head.  EXAM: CT HEAD WITHOUT CONTRAST  TECHNIQUE: Contiguous axial images were obtained from the base of the skull through the vertex without intravenous contrast.  COMPARISON:  10/04/2012.  FINDINGS: Stable age related cerebral atrophy, ventriculomegaly and periventricular white matter disease. No extra-axial fluid collections are identified. No CT findings for acute hemispheric infarction or intracranial hemorrhage. No mass lesions. The brainstem and cerebellum are normal.  No acute fractures identified. There is a right occipital scalp hematoma without radiopaque foreign body. The paranasal sinuses and mastoid air cells are clear.  IMPRESSION: Age related cerebral atrophy, ventriculomegaly and periventricular white matter disease.  No acute intracranial findings or acute skull fracture.   Electronically Signed   By: Kalman Jewels  M.D.   On: 05/23/2014 17:55     EKG Interpretation None      MDM   Final diagnoses:  Minor head injury, initial encounter   80 year old with a history of CAD, hypertension, high cholesterol, COPD on aspirin but no other blood thinners who presents today after a fall from standing distress the back side of his head and has small lacerations to his posterior occiput and a skin tear on his right hand.  Upon arrival patient is hemodynamically stable. He appears in no acute distress. He is in good spirits. Patient denies any headache, nausea. Patient has not vomited. Denies loss of consciousness. Given the patient's age and comorbidities, will obtain a CT head to rule out any intracranial abnormalities. Posterior lacerations or hemostatic and are well approximated and do not require sutures at this time. Tetanus to be updated.  CT scan without any evidence of intracranial findings or acute skull fracture. Patient reassessed and still awake alert and oriented. Discussed the results of the CT scan with family member at the bedside and with the  patient. Strict return precautions were given. Patient discharged in stable condition. Patient seen and evaluated by myself and by the attending Dr. Stark Jock.       Freddi Che, MD 05/23/14 1806

## 2014-05-23 NOTE — ED Notes (Signed)
Pt reports that he was reaching for something when he lost his balance, fell backwards and hit head on coffee table. Denies LOC. Hematoma noted to posterior aspect of head, bleeding controlled. Pt AO x4. PERRLA, 4 mm. Pt denies pain. Pt took 81 mg aspirin this AM, but denies taking any other blood thinner. NAD.

## 2014-05-24 NOTE — ED Provider Notes (Signed)
I saw and evaluated the patient, reviewed the resident's note and I agree with the findings and plan. Patient is an 80 year old male brought for evaluation after a fall.  He fell and struck his head on the coffee table.  He denies loc, syncope.  He denies neck pain.  He denies other injury in the fall.  On exam, vitals are stable and he is afebrile.  Head is noted to have two superficial lacerations to the occiput.  Bleeding is controlled and the wounds are well-approximated.  Neck is supple.  There is no bony ttp and he has full range of motion.  Heart is rrr and lungs are clear.  Abd is benign.  Neurologically, cranial nerves are intact without deficit.  Head ct is unremarkable and the lacerations do not require repair.  He is neurologically intact. Will discharge, to return prn for any problems.        Veryl Speak, MD 05/24/14 [phone]

## 2015-03-23 ENCOUNTER — Other Ambulatory Visit (HOSPITAL_COMMUNITY): Payer: Self-pay

## 2015-03-23 ENCOUNTER — Encounter (HOSPITAL_COMMUNITY): Payer: Self-pay | Admitting: *Deleted

## 2015-03-23 ENCOUNTER — Emergency Department (HOSPITAL_COMMUNITY): Payer: Commercial Managed Care - HMO

## 2015-03-23 ENCOUNTER — Inpatient Hospital Stay (HOSPITAL_COMMUNITY)
Admission: EM | Admit: 2015-03-23 | Discharge: 2015-03-25 | DRG: 192 | Disposition: A | Discharge status: Home / Self Care | Payer: Commercial Managed Care - HMO | Attending: Internal Medicine | Admitting: Internal Medicine

## 2015-03-23 DIAGNOSIS — I4510 Unspecified right bundle-branch block: Secondary | ICD-10-CM | POA: Diagnosis present

## 2015-03-23 DIAGNOSIS — Z87891 Personal history of nicotine dependence: Secondary | ICD-10-CM | POA: Diagnosis not present

## 2015-03-23 DIAGNOSIS — Z8546 Personal history of malignant neoplasm of prostate: Secondary | ICD-10-CM | POA: Diagnosis not present

## 2015-03-23 DIAGNOSIS — M109 Gout, unspecified: Secondary | ICD-10-CM | POA: Diagnosis present

## 2015-03-23 DIAGNOSIS — R05 Cough: Secondary | ICD-10-CM | POA: Diagnosis present

## 2015-03-23 DIAGNOSIS — F419 Anxiety disorder, unspecified: Secondary | ICD-10-CM | POA: Diagnosis present

## 2015-03-23 DIAGNOSIS — R059 Cough, unspecified: Secondary | ICD-10-CM | POA: Diagnosis present

## 2015-03-23 DIAGNOSIS — G8929 Other chronic pain: Secondary | ICD-10-CM | POA: Diagnosis present

## 2015-03-23 DIAGNOSIS — M545 Low back pain: Secondary | ICD-10-CM | POA: Diagnosis present

## 2015-03-23 DIAGNOSIS — I481 Persistent atrial fibrillation: Secondary | ICD-10-CM | POA: Diagnosis not present

## 2015-03-23 DIAGNOSIS — Z880 Allergy status to penicillin: Secondary | ICD-10-CM

## 2015-03-23 DIAGNOSIS — Z9841 Cataract extraction status, right eye: Secondary | ICD-10-CM

## 2015-03-23 DIAGNOSIS — E860 Dehydration: Secondary | ICD-10-CM | POA: Diagnosis present

## 2015-03-23 DIAGNOSIS — Z951 Presence of aortocoronary bypass graft: Secondary | ICD-10-CM

## 2015-03-23 DIAGNOSIS — I480 Paroxysmal atrial fibrillation: Secondary | ICD-10-CM | POA: Diagnosis not present

## 2015-03-23 DIAGNOSIS — M1990 Unspecified osteoarthritis, unspecified site: Secondary | ICD-10-CM | POA: Diagnosis present

## 2015-03-23 DIAGNOSIS — D649 Anemia, unspecified: Secondary | ICD-10-CM | POA: Diagnosis present

## 2015-03-23 DIAGNOSIS — Z952 Presence of prosthetic heart valve: Secondary | ICD-10-CM | POA: Diagnosis not present

## 2015-03-23 DIAGNOSIS — E780 Pure hypercholesterolemia: Secondary | ICD-10-CM | POA: Diagnosis present

## 2015-03-23 DIAGNOSIS — J209 Acute bronchitis, unspecified: Secondary | ICD-10-CM | POA: Diagnosis present

## 2015-03-23 DIAGNOSIS — E039 Hypothyroidism, unspecified: Secondary | ICD-10-CM | POA: Diagnosis present

## 2015-03-23 DIAGNOSIS — W19XXXA Unspecified fall, initial encounter: Secondary | ICD-10-CM

## 2015-03-23 DIAGNOSIS — I2510 Atherosclerotic heart disease of native coronary artery without angina pectoris: Secondary | ICD-10-CM | POA: Diagnosis present

## 2015-03-23 DIAGNOSIS — I10 Essential (primary) hypertension: Secondary | ICD-10-CM | POA: Diagnosis present

## 2015-03-23 DIAGNOSIS — G4733 Obstructive sleep apnea (adult) (pediatric): Secondary | ICD-10-CM | POA: Diagnosis present

## 2015-03-23 DIAGNOSIS — J441 Chronic obstructive pulmonary disease with (acute) exacerbation: Principal | ICD-10-CM | POA: Diagnosis present

## 2015-03-23 DIAGNOSIS — N400 Enlarged prostate without lower urinary tract symptoms: Secondary | ICD-10-CM | POA: Diagnosis present

## 2015-03-23 DIAGNOSIS — I4891 Unspecified atrial fibrillation: Secondary | ICD-10-CM | POA: Diagnosis present

## 2015-03-23 DIAGNOSIS — E876 Hypokalemia: Secondary | ICD-10-CM | POA: Diagnosis present

## 2015-03-23 DIAGNOSIS — F0390 Unspecified dementia without behavioral disturbance: Secondary | ICD-10-CM | POA: Diagnosis present

## 2015-03-23 DIAGNOSIS — Z9842 Cataract extraction status, left eye: Secondary | ICD-10-CM | POA: Diagnosis not present

## 2015-03-23 DIAGNOSIS — I509 Heart failure, unspecified: Secondary | ICD-10-CM | POA: Diagnosis not present

## 2015-03-23 DIAGNOSIS — R627 Adult failure to thrive: Secondary | ICD-10-CM | POA: Diagnosis present

## 2015-03-23 LAB — URINALYSIS, ROUTINE W REFLEX MICROSCOPIC
Bilirubin Urine: NEGATIVE
Glucose, UA: NEGATIVE mg/dL
Hgb urine dipstick: NEGATIVE
Ketones, ur: NEGATIVE mg/dL
Leukocytes, UA: NEGATIVE
Nitrite: NEGATIVE
Protein, ur: NEGATIVE mg/dL
Specific Gravity, Urine: 1.016 (ref 1.005–1.030)
Urobilinogen, UA: 1 mg/dL (ref 0.0–1.0)
pH: 7.5 (ref 5.0–8.0)

## 2015-03-23 LAB — CBC WITH DIFFERENTIAL/PLATELET
Basophils Absolute: 0 10*3/uL (ref 0.0–0.1)
Basophils Relative: 0 % (ref 0–1)
Eosinophils Absolute: 0.1 10*3/uL (ref 0.0–0.7)
Eosinophils Relative: 1 % (ref 0–5)
HCT: 35.5 % — ABNORMAL LOW (ref 39.0–52.0)
Hemoglobin: 11.4 g/dL — ABNORMAL LOW (ref 13.0–17.0)
Lymphocytes Relative: 7 % — ABNORMAL LOW (ref 12–46)
Lymphs Abs: 0.9 10*3/uL (ref 0.7–4.0)
MCH: 26.3 pg (ref 26.0–34.0)
MCHC: 32.1 g/dL (ref 30.0–36.0)
MCV: 82 fL (ref 78.0–100.0)
Monocytes Absolute: 1.1 10*3/uL — ABNORMAL HIGH (ref 0.1–1.0)
Monocytes Relative: 8 % (ref 3–12)
Neutro Abs: 12.1 10*3/uL — ABNORMAL HIGH (ref 1.7–7.7)
Neutrophils Relative %: 85 % — ABNORMAL HIGH (ref 43–77)
Platelets: 179 10*3/uL (ref 150–400)
RBC: 4.33 MIL/uL (ref 4.22–5.81)
RDW: 16.6 % — ABNORMAL HIGH (ref 11.5–15.5)
WBC: 14.3 10*3/uL — ABNORMAL HIGH (ref 4.0–10.5)

## 2015-03-23 LAB — BASIC METABOLIC PANEL
Anion gap: 10 (ref 5–15)
BUN: 16 mg/dL (ref 6–20)
CO2: 24 mmol/L (ref 22–32)
Calcium: 8.6 mg/dL — ABNORMAL LOW (ref 8.9–10.3)
Chloride: 104 mmol/L (ref 101–111)
Creatinine, Ser: 1.07 mg/dL (ref 0.61–1.24)
GFR calc Af Amer: >60 mL/min (ref >60)
GFR calc non Af Amer: 59 mL/min — ABNORMAL LOW (ref >60)
Glucose, Bld: 128 mg/dL — ABNORMAL HIGH (ref 65–99)
Potassium: 4 mmol/L (ref 3.5–5.1)
Sodium: 138 mmol/L (ref 135–145)

## 2015-03-23 LAB — I-STAT CG4 LACTIC ACID, ED: Lactic Acid, Venous: 1.99 mmol/L (ref 0.5–2.0)

## 2015-03-23 MED ORDER — DEXTROSE 5 % IV SOLN
500.0000 mg | Freq: Once | INTRAVENOUS | Status: AC
  Administered 2015-03-23: 500 mg via INTRAVENOUS
  Filled 2015-03-23: qty 500

## 2015-03-23 MED ORDER — ALBUTEROL (5 MG/ML) CONTINUOUS INHALATION SOLN
10.0000 mg/h | INHALATION_SOLUTION | RESPIRATORY_TRACT | Status: AC
  Administered 2015-03-23: 10 mg/h via RESPIRATORY_TRACT
  Filled 2015-03-23: qty 20

## 2015-03-23 MED ORDER — DEXTROSE 5 % IV SOLN
1.0000 g | Freq: Once | INTRAVENOUS | Status: AC
  Administered 2015-03-23: 1 g via INTRAVENOUS
  Filled 2015-03-23: qty 10

## 2015-03-23 NOTE — ED Notes (Signed)
Per EMS, pt complains of weakness, cough, shortness of breath for the past 2 weeks. Pt also states he has not been able to keep anything down for the past 4 days. Pt has hx of COPD. Pt was seen at his PCP 2 weeks ago for cough and weakness, states he had chest x-ray and was given a shot but is unsure what was in it.

## 2015-03-23 NOTE — H&P (Addendum)
PCP:  Henrine Screws, MD  Vascular Surgery [facility]  Referring provider Irena Cords   Chief Complaint:  Too weak to walk HPI: Logan Allen is a 81 y.o. male   has a past medical history of Coronary artery disease; Hypertension; High cholesterol; COPD (chronic obstructive pulmonary disease); Exertional dyspnea (08/20/2012); History of blood transfusion (1994; 2004); History of carotid endarterectomy (1994); Recurrent falls; Arthritis; Chronic lower back pain; Anxiety; Mental disorder; Prostate cancer (1988); Hypothyroidism; OSA (obstructive sleep apnea); Gout; and Incontinence of urine.   Presented with  For the past 2 weeks or so patienet have had cough and increased shortness of breath with some low grade fever. He has not been eating well. Family took him to PCP and he was given a Surveyor, quantity of antibiotic". Patient have not been eating or drinking much, started to feel very weak and unable to ambulate. Family attempted to get him up to feed but he could not stand. EMS was called He was brought to ER. CXR showing COPD no infiltrate, no UTI but noted to have elevated WBC. Patient with some diminished air movement started on nebulizer with some response.   Hospitalist was called for admission for COPD exacerbation and failure to thrive  Review of Systems:    Pertinent positives include: Fevers, chills, fatigue shortness of breath at rest.  productive cough,   Constitutional:  No weight loss, night sweats, weight loss  HEENT:  No headaches, Difficulty swallowing,Tooth/dental problems,Sore throat,  No sneezing, itching, ear ache, nasal congestion, post nasal drip,  Cardio-vascular:  No chest pain, Orthopnea, PND, anasarca, dizziness, palpitations.no Bilateral lower extremity swelling  GI:  No heartburn, indigestion, abdominal pain, nausea, vomiting, diarrhea, change in bowel habits, loss of appetite, melena, blood in stool, hematemesis Resp:  no No dyspnea on exertion, No excess  mucus, noNo non-productive cough, No coughing up of blood.No change in color of mucus.No wheezing. Skin:  no rash or lesions. No jaundice GU:  no dysuria, change in color of urine, no urgency or frequency. No straining to urinate.  No flank pain.  Musculoskeletal:  No joint pain or no joint swelling. No decreased range of motion. No back pain.  Psych:  No change in mood or affect. No depression or anxiety. No memory loss.  Neuro: no localizing neurological complaints, no tingling, no weakness, no double vision, no gait abnormality, no slurred speech, no confusion  Otherwise ROS are negative except for above, 10 systems were reviewed  Past Medical History: Past Medical History  Diagnosis Date  . Coronary artery disease   . Hypertension   . High cholesterol   . COPD (chronic obstructive pulmonary disease)   . Exertional dyspnea 08/20/2012  . History of blood transfusion 1994; 2004    "w/both bypass ORs"  . History of carotid endarterectomy 1994    right  . Recurrent falls     "last fall 08/15/2012" (08/20/2012)  . Arthritis     "I suffer all over my body; dr said it could be from arthritis" (08/20/2012)  . Chronic lower back pain   . Anxiety   . Mental disorder   . Prostate cancer 1988    radiation treatment  . Hypothyroidism   . OSA (obstructive sleep apnea)   . Gout     "thumb" (08/20/2012)  . Incontinence of urine    Past Surgical History  Procedure Laterality Date  . Appendectomy    . Cataract extraction w/ intraocular lens  implant, bilateral    . Coronary artery  bypass graft  1994; 2004  . Prostatectomy    . Hernia repair      UHR  . Cardiac valve replacement  2004    AVR  . Orchiectomy  2001    bilateral  . Penile prosthesis  removal  2001  . Circumcision  2001     Medications: Prior to Admission medications   Medication Sig Start Date End Date Taking? Authorizing Provider  allopurinol (ZYLOPRIM) 100 MG tablet Take 100 mg by mouth 2 (two) times daily.    Yes Historical Provider, MD  aspirin EC 81 MG tablet Take 81 mg by mouth daily.   Yes Historical Provider, MD  bimatoprost (LUMIGAN) 0.01 % SOLN Place 1 drop into both eyes at bedtime.   Yes Historical Provider, MD  budesonide (PULMICORT) 180 MCG/ACT inhaler Inhale 2 puffs into the lungs 2 (two) times daily.   Yes Historical Provider, MD  calcium-vitamin D (OSCAL WITH D) 500-200 MG-UNIT per tablet Take 1 tablet by mouth 2 (two) times daily.   Yes Historical Provider, MD  cilostazol (PLETAL) 50 MG tablet Take 50 mg by mouth 2 (two) times daily.   Yes Historical Provider, MD  clonazePAM (KLONOPIN) 2 MG tablet Take 2 mg by mouth at bedtime.   Yes Historical Provider, MD  finasteride (PROSCAR) 5 MG tablet Take 5 mg by mouth every morning.  02/04/15  Yes Historical Provider, MD  guaiFENesin (MUCINEX) 600 MG 12 hr tablet Take 600 mg by mouth 2 (two) times daily. For cough/congestion   Yes Historical Provider, MD  HYDROcodone-acetaminophen (NORCO/VICODIN) 5-325 MG per tablet Take 1 tablet by mouth every 8 (eight) hours. Takes on Tuesday Thursday Saturday and Sunday   Yes Historical Provider, MD  Melatonin 200 MCG TABS Take 300 mcg by mouth at bedtime.   Yes Historical Provider, MD  meloxicam (MOBIC) 15 MG tablet Take 15 mg by mouth See admin instructions. Monday Wednesday Friday   Yes Historical Provider, MD  memantine (NAMENDA) 10 MG tablet Take 10 mg by mouth 2 (two) times daily.   Yes Historical Provider, MD  metoprolol succinate (TOPROL-XL) 25 MG 24 hr tablet Take 25 mg by mouth daily.   Yes Historical Provider, MD  oxybutynin (DITROPAN) 5 MG tablet Take 5 mg by mouth at bedtime.   Yes Historical Provider, MD  potassium chloride SA (K-DUR,KLOR-CON) 20 MEQ tablet Take 20 mEq by mouth daily.   Yes Historical Provider, MD  venlafaxine (EFFEXOR) 37.5 MG tablet Take 37.5 mg by mouth 2 (two) times daily.   Yes Historical Provider, MD  vitamin B-12 (CYANOCOBALAMIN) 1000 MCG tablet Take 1,000 mcg by mouth  daily.   Yes Historical Provider, MD    Allergies:   Allergies  Allergen Reactions  . Percocet [Oxycodone-Acetaminophen] Other (See Comments)    delusional  . Zocor [Simvastatin]     Unknown   . Penicillins Rash    Social History:  Ambulatory  walker cane  Lives at home alone,      reports that he has quit smoking. His smoking use included Cigarettes. He has a 137.5 pack-year smoking history. He has never used smokeless tobacco. He reports that he does not drink alcohol or use illicit drugs.    Family History: family history includes Depression in his father; Hypertension in his father.    Physical Exam: Patient Vitals for the past 24 hrs:  BP Temp Temp src Pulse Resp SpO2  03/23/15 2246 - - - - - 93 %  03/23/15 2200 141/75 mmHg - -  93 22 92 %  03/23/15 2159 - 99.4 F (37.4 C) Oral 94 14 96 %  03/23/15 2130 128/58 mmHg - - 82 - 95 %  03/23/15 2127 148/66 mmHg - - 87 15 95 %  03/23/15 2100 148/66 mmHg - - 92 - 94 %  03/23/15 2030 144/65 mmHg - - - - -  03/23/15 2000 170/66 mmHg - - 95 - 95 %  03/23/15 1926 144/56 mmHg - - 95 19 92 %  03/23/15 1804 174/57 mmHg 97.5 F (36.4 C) Oral 95 18 97 %    1. General:  in No Acute distress 2. Psychological: Alert and  Oriented 3. Head/ENT:     Dry Mucous Membranes                          Head Non traumatic, neck supple                          Normal  Dentition 4. SKIN:   decreased Skin turgor,  Skin clean Dry and intact no rash 5. Heart: Regular rate and rhythm no Murmur, Rub or gallop 6. Lungs: Mild wheezes worse breath sounds   7. Abdomen: Soft, non-tender, Non distended 8. Lower extremities: no clubbing, cyanosis, or edema 9. Neurologically Grossly intact, moving all 4 extremities equally has generalized weakness noted 10. MSK: Normal range of motion  body mass index is unknown because there is no weight on file.   Labs on Admission:   Results for orders placed or performed during the hospital encounter of  03/23/15 (from the past 24 hour(s))  Basic metabolic panel     Status: Abnormal   Collection Time: 03/23/15  6:25 PM  Result Value Ref Range   Sodium 138 135 - 145 mmol/L   Potassium 4.0 3.5 - 5.1 mmol/L   Chloride 104 101 - 111 mmol/L   CO2 24 22 - 32 mmol/L   Glucose, Bld 128 (H) 65 - 99 mg/dL   BUN 16 6 - 20 mg/dL   Creatinine, Ser 1.07 0.61 - 1.24 mg/dL   Calcium 8.6 (L) 8.9 - 10.3 mg/dL   GFR calc non Af Amer 59 (L) >60 mL/min   GFR calc Af Amer >60 >60 mL/min   Anion gap 10 5 - 15  CBC with Differential     Status: Abnormal   Collection Time: 03/23/15  6:25 PM  Result Value Ref Range   WBC 14.3 (H) 4.0 - 10.5 K/uL   RBC 4.33 4.22 - 5.81 MIL/uL   Hemoglobin 11.4 (L) 13.0 - 17.0 g/dL   HCT 35.5 (L) 39.0 - 52.0 %   MCV 82.0 78.0 - 100.0 fL   MCH 26.3 26.0 - 34.0 pg   MCHC 32.1 30.0 - 36.0 g/dL   RDW 16.6 (H) 11.5 - 15.5 %   Platelets 179 150 - 400 K/uL   Neutrophils Relative % 85 (H) 43 - 77 %   Neutro Abs 12.1 (H) 1.7 - 7.7 K/uL   Lymphocytes Relative 7 (L) 12 - 46 %   Lymphs Abs 0.9 0.7 - 4.0 K/uL   Monocytes Relative 8 3 - 12 %   Monocytes Absolute 1.1 (H) 0.1 - 1.0 K/uL   Eosinophils Relative 1 0 - 5 %   Eosinophils Absolute 0.1 0.0 - 0.7 K/uL   Basophils Relative 0 0 - 1 %   Basophils Absolute 0.0 0.0 - 0.1 K/uL  I-Stat  CG4 Lactic Acid, ED     Status: None   Collection Time: 03/23/15  6:33 PM  Result Value Ref Range   Lactic Acid, Venous 1.99 0.5 - 2.0 mmol/L  Urinalysis, Routine w reflex microscopic     Status: None   Collection Time: 03/23/15  7:16 PM  Result Value Ref Range   Color, Urine YELLOW YELLOW   APPearance CLEAR CLEAR   Specific Gravity, Urine 1.016 1.005 - 1.030   pH 7.5 5.0 - 8.0   Glucose, UA NEGATIVE NEGATIVE mg/dL   Hgb urine dipstick NEGATIVE NEGATIVE   Bilirubin Urine NEGATIVE NEGATIVE   Ketones, ur NEGATIVE NEGATIVE mg/dL   Protein, ur NEGATIVE NEGATIVE mg/dL   Urobilinogen, UA 1.0 0.0 - 1.0 mg/dL   Nitrite NEGATIVE NEGATIVE    Leukocytes, UA NEGATIVE NEGATIVE    UA no uti  No results found for: HGBA1C  CrCl cannot be calculated (Unknown ideal weight.).  BNP (last 3 results) No results for input(s): PROBNP in the last 8760 hours.  Other results:  I have pearsonaly reviewed this: ECG REPORT  Rate:96   Rhythm: RBBB with frequent PAC's, P waves noted ST&T Change: no ischemic changes QTC 447   There were no vitals filed for this visit.   Cultures:    Component Value Date/Time   SDES URINE, RANDOM 08/26/2012 1154   SPECREQUEST NONE 08/26/2012 1154   CULT NO GROWTH 08/26/2012 1154   REPTSTATUS 08/27/2012 FINAL 08/26/2012 1154     Radiological Exams on Admission: Dg Chest 2 View  03/23/2015   CLINICAL DATA:  Two week history of generalized weakness, cough, and shortness of breath. Four day history of nausea and vomiting. Current history of COPD. Prior CABG.  EXAM: CHEST  2 VIEW  COMPARISON:  08/20/2012 and earlier.  FINDINGS: AP erect and lateral imaging was performed. Suboptimal inspiration. Sternotomy for CABG. Cardiac silhouette normal in size, unchanged. Thoracic aorta tortuous and atherosclerotic, unchanged. Hilar and mediastinal contours otherwise unremarkable. Prominent bronchovascular markings diffusely and mild central peribronchial thickening, similar prior. Lungs otherwise clear. No localized airspace consolidation. No pleural effusions. No pneumothorax. Normal pulmonary vascularity. Osteoporotic compression fracture of what I believe is T10, unchanged. Compression fracture of what I believe is L2 with prior augmentation.  IMPRESSION: Suboptimal inspiration. Stable chronic bronchitis and/or asthma. No acute cardiopulmonary disease.   Electronically Signed   By: Evangeline Dakin M.D.   On: 03/23/2015 18:57    Chart has been reviewed  Family not  at  Bedside     Assessment/Plan 81 YO male with history of dementia mild, essential hypertension and COPD presents with COPD exacerbation and  evidence of dehydration and decompensation. EKG showing unclear rhythm  Present on Admission:  . Adult failure to thrive - negative poor by mouth intake. They'll administer IV fluids treat underlying COPD and reevaluate patient will need PT OT evaluation, given frequent falls will obtain CT scan of the head this patient unable to provide history with a history of hit his head or not  . Essential hypertension - continue home medications  . Dementia - mild currently appears to be alert and oriented. We'll continue to monitor family not at bedside  . COPD exacerbation -  - Will initiate Steroid taper, antibiotics, Albuterol PRN, scheduled Atrovent, Dulera and Mucinex. Titrate O2 to saturation >90%. Follow patients respiratory status.  . Dehydration - due to poor by mouth intake will rehydrate  Abnormal  EKG  - we'll repeat, unclear rhythm possible MHC versus A. fib versus  sinus with frequent PACs will be will reevaluate the of rehydration. Currently rate controlled currently patient has history of frequent falls will be a poor candidate for anticoagulation    Prophylaxis:  Lovenox   CODE STATUS:  FULL CODE  as per patient    Disposition:  likely will need placement for rehabilitation     Other plan as per orders.  I have spent a total of 55 min on this admission  Divinity Kyler 03/23/2015, 10:56 PM  [facility]  Pager [phone]   after 2 AM please page floor coverage PA If 7AM-7PM, please contact the day team taking care of the patient  [email or web address]  Password [email or web address]

## 2015-03-23 NOTE — ED Notes (Signed)
Bed: WA05 Expected date:  Expected time:  Means of arrival:  Comments: EMS-FTT 

## 2015-03-23 NOTE — ED Notes (Signed)
Admitting physician at bedside for evaluation

## 2015-03-23 NOTE — ED Provider Notes (Signed)
CSN: [id number]     Arrival date & time 03/23/15  1749 History   First MD Initiated Contact with Patient 03/23/15 1809     Chief Complaint  Patient presents with  . Weakness  . Shortness of Breath  . Cough     (Consider location/radiation/quality/duration/timing/severity/associated sxs/prior Treatment) HPI Patient presents to the emergency department with a 2 week history of cough, weakness and shortness of breath.  Patient states that he has been having productive cough over this timeframe.  He states he was seen at his primary care doctor's office 2 weeks ago and given a shot of anabiotic's, but was unsure of what this was, he states he is not sent home with any other medications.  He has a history of COPD and has had wheezing associated with his symptoms.  The patient states that he feels very weak and not able to walk.  The family states that today he was attempting to ambulate but needed significant assistance and was lowered to the ground because he was too weak to help the family states this is not normal for him and he can usually ambulate without difficulty.  Patient denies chest pain, nausea, vomiting, diarrhea, bloody stool, dysuria, back pain, neck pain, abdominal pain, or syncope.  The patient states that nothing seems make his condition, better or worse Past Medical History  Diagnosis Date  . Coronary artery disease   . Hypertension   . High cholesterol   . COPD (chronic obstructive pulmonary disease)   . Exertional dyspnea 08/20/2012  . History of blood transfusion 1994; 2004    "w/both bypass ORs"  . History of carotid endarterectomy 1994    right  . Recurrent falls     "last fall 08/15/2012" (08/20/2012)  . Arthritis     "I suffer all over my body; dr said it could be from arthritis" (08/20/2012)  . Chronic lower back pain   . Anxiety   . Mental disorder   . Prostate cancer 1988    radiation treatment  . Hypothyroidism   . OSA (obstructive sleep apnea)   . Gout      "thumb" (08/20/2012)  . Incontinence of urine    Past Surgical History  Procedure Laterality Date  . Appendectomy    . Cataract extraction w/ intraocular lens  implant, bilateral    . Coronary artery bypass graft  1994; 2004  . Prostatectomy    . Hernia repair      UHR  . Cardiac valve replacement  2004    AVR  . Orchiectomy  2001    bilateral  . Penile prosthesis  removal  2001  . Circumcision  2001   Family History  Problem Relation Age of Onset  . Hypertension Father   . Depression Father    History  Substance Use Topics  . Smoking status: Former Smoker -- 2.50 packs/day for 55 years    Types: Cigarettes  . Smokeless tobacco: Never Used     Comment: 08/20/2012 "stopped smoking cigarettes ~ 1994"  . Alcohol Use: No    Review of Systems  All other systems negative except as documented in the HPI. All pertinent positives and negatives as reviewed in the HPI.  Allergies  Percocet; Zocor; and Penicillins  Home Medications   Prior to Admission medications   Medication Sig Start Date End Date Taking? Authorizing Provider  allopurinol (ZYLOPRIM) 100 MG tablet Take 100 mg by mouth 2 (two) times daily.   Yes Historical Provider, MD  aspirin  EC 81 MG tablet Take 81 mg by mouth daily.   Yes Historical Provider, MD  bimatoprost (LUMIGAN) 0.01 % SOLN Place 1 drop into both eyes at bedtime.   Yes Historical Provider, MD  budesonide (PULMICORT) 180 MCG/ACT inhaler Inhale 2 puffs into the lungs 2 (two) times daily.   Yes Historical Provider, MD  calcium-vitamin D (OSCAL WITH D) 500-200 MG-UNIT per tablet Take 1 tablet by mouth 2 (two) times daily.   Yes Historical Provider, MD  cilostazol (PLETAL) 50 MG tablet Take 50 mg by mouth 2 (two) times daily.   Yes Historical Provider, MD  clonazePAM (KLONOPIN) 2 MG tablet Take 2 mg by mouth at bedtime.   Yes Historical Provider, MD  finasteride (PROSCAR) 5 MG tablet Take 5 mg by mouth every morning.  02/04/15  Yes Historical Provider,  MD  guaiFENesin (MUCINEX) 600 MG 12 hr tablet Take 600 mg by mouth 2 (two) times daily. For cough/congestion   Yes Historical Provider, MD  HYDROcodone-acetaminophen (NORCO/VICODIN) 5-325 MG per tablet Take 1 tablet by mouth every 8 (eight) hours. Takes on Tuesday Thursday Saturday and Sunday   Yes Historical Provider, MD  Melatonin 200 MCG TABS Take 300 mcg by mouth at bedtime.   Yes Historical Provider, MD  meloxicam (MOBIC) 15 MG tablet Take 15 mg by mouth See admin instructions. Monday Wednesday Friday   Yes Historical Provider, MD  memantine (NAMENDA) 10 MG tablet Take 10 mg by mouth 2 (two) times daily.   Yes Historical Provider, MD  metoprolol succinate (TOPROL-XL) 25 MG 24 hr tablet Take 25 mg by mouth daily.   Yes Historical Provider, MD  oxybutynin (DITROPAN) 5 MG tablet Take 5 mg by mouth at bedtime.   Yes Historical Provider, MD  potassium chloride SA (K-DUR,KLOR-CON) 20 MEQ tablet Take 20 mEq by mouth daily.   Yes Historical Provider, MD  venlafaxine (EFFEXOR) 37.5 MG tablet Take 37.5 mg by mouth 2 (two) times daily.   Yes Historical Provider, MD  vitamin B-12 (CYANOCOBALAMIN) 1000 MCG tablet Take 1,000 mcg by mouth daily.   Yes Historical Provider, MD   BP 142/56 mmHg  Pulse 90  Temp(Src) 99.4 F (37.4 C) (Oral)  Resp 20  SpO2 93% Physical Exam  Constitutional: He is oriented to person, place, and time. He appears well-developed and well-nourished. No distress.  HENT:  Head: Normocephalic and atraumatic.  Mouth/Throat: Oropharynx is clear and moist.  Eyes: Pupils are equal, round, and reactive to light.  Neck: Normal range of motion. Neck supple.  Cardiovascular: Normal rate, regular rhythm and normal heart sounds.   Pulmonary/Chest: Effort normal. No respiratory distress. He has wheezes. He has no rales.  Musculoskeletal: He exhibits no edema.  Neurological: He is alert and oriented to person, place, and time. He exhibits normal muscle tone. Coordination normal.  Skin:  Skin is warm and dry. No rash noted. No erythema.  Psychiatric: He has a normal mood and affect. His behavior is normal.  Nursing note and vitals reviewed.   ED Course  Procedures (including critical care time) Labs Review Labs Reviewed  BASIC METABOLIC PANEL - Abnormal; Notable for the following:    Glucose, Bld 128 (*)    Calcium 8.6 (*)    GFR calc non Af Amer 59 (*)    All other components within normal limits  CBC WITH DIFFERENTIAL/PLATELET - Abnormal; Notable for the following:    WBC 14.3 (*)    Hemoglobin 11.4 (*)    HCT 35.5 (*)  RDW 16.6 (*)    Neutrophils Relative % 85 (*)    Neutro Abs 12.1 (*)    Lymphocytes Relative 7 (*)    Monocytes Absolute 1.1 (*)    All other components within normal limits  URINALYSIS, ROUTINE W REFLEX MICROSCOPIC (NOT AT [facility])  I-STAT CG4 LACTIC ACID, ED    Imaging Review Dg Chest 2 View  03/23/2015   CLINICAL DATA:  Two week history of generalized weakness, cough, and shortness of breath. Four day history of nausea and vomiting. Current history of COPD. Prior CABG.  EXAM: CHEST  2 VIEW  COMPARISON:  08/20/2012 and earlier.  FINDINGS: AP erect and lateral imaging was performed. Suboptimal inspiration. Sternotomy for CABG. Cardiac silhouette normal in size, unchanged. Thoracic aorta tortuous and atherosclerotic, unchanged. Hilar and mediastinal contours otherwise unremarkable. Prominent bronchovascular markings diffusely and mild central peribronchial thickening, similar prior. Lungs otherwise clear. No localized airspace consolidation. No pleural effusions. No pneumothorax. Normal pulmonary vascularity. Osteoporotic compression fracture of what I believe is T10, unchanged. Compression fracture of what I believe is L2 with prior augmentation.  IMPRESSION: Suboptimal inspiration. Stable chronic bronchitis and/or asthma. No acute cardiopulmonary disease.   Electronically Signed   By: Evangeline Dakin M.D.   On: 03/23/2015 18:57     EKG  Interpretation   Date/Time:  Wednesday Mar 23 2015 18:10:39 EDT Ventricular Rate:  96 PR Interval:    QRS Duration: 134 QT Interval:  354 QTC Calculation: 447 R Axis:   -105 Text Interpretation:  Atrial fibrillation RBBB and LAFB Confirmed by BELFI   MD, MELANIE ([id number]) on 03/23/2015 6:13:36 PM     She will be admitted to the hospital for further evaluation of COPD exacerbation/early pneumonia.  Patient is to ice and the plan and all questions were answered.  I spoke with the [facility] who will be down to evaluate the patient    Dalia Heading, PA-C 03/25/15 0126  Malvin Johns, MD 03/28/15 [phone]

## 2015-03-24 ENCOUNTER — Inpatient Hospital Stay (HOSPITAL_COMMUNITY): Payer: Commercial Managed Care - HMO

## 2015-03-24 DIAGNOSIS — F0390 Unspecified dementia without behavioral disturbance: Secondary | ICD-10-CM

## 2015-03-24 DIAGNOSIS — I10 Essential (primary) hypertension: Secondary | ICD-10-CM

## 2015-03-24 DIAGNOSIS — I480 Paroxysmal atrial fibrillation: Secondary | ICD-10-CM

## 2015-03-24 DIAGNOSIS — R627 Adult failure to thrive: Secondary | ICD-10-CM

## 2015-03-24 DIAGNOSIS — J441 Chronic obstructive pulmonary disease with (acute) exacerbation: Principal | ICD-10-CM

## 2015-03-24 DIAGNOSIS — E860 Dehydration: Secondary | ICD-10-CM

## 2015-03-24 DIAGNOSIS — I4891 Unspecified atrial fibrillation: Secondary | ICD-10-CM

## 2015-03-24 DIAGNOSIS — I509 Heart failure, unspecified: Secondary | ICD-10-CM

## 2015-03-24 LAB — CBC
HCT: 33.6 % — ABNORMAL LOW (ref 39.0–52.0)
Hemoglobin: 10.5 g/dL — ABNORMAL LOW (ref 13.0–17.0)
MCH: 25.5 pg — ABNORMAL LOW (ref 26.0–34.0)
MCHC: 31.3 g/dL (ref 30.0–36.0)
MCV: 81.8 fL (ref 78.0–100.0)
PLATELETS: UNDETERMINED 10*3/uL (ref 150–400)
RBC: 4.11 MIL/uL — AB (ref 4.22–5.81)
RDW: 16.7 % — AB (ref 11.5–15.5)
WBC: 13.1 10*3/uL — AB (ref 4.0–10.5)

## 2015-03-24 LAB — COMPREHENSIVE METABOLIC PANEL
ALBUMIN: 3.2 g/dL — AB (ref 3.5–5.0)
ALK PHOS: 81 U/L (ref 38–126)
ALT: 17 U/L (ref 17–63)
ANION GAP: 13 (ref 5–15)
AST: 20 U/L (ref 15–41)
BILIRUBIN TOTAL: 0.2 mg/dL — AB (ref 0.3–1.2)
BUN: 19 mg/dL (ref 6–20)
CO2: 23 mmol/L (ref 22–32)
Calcium: 8.8 mg/dL — ABNORMAL LOW (ref 8.9–10.3)
Chloride: 104 mmol/L (ref 101–111)
Creatinine, Ser: 1.1 mg/dL (ref 0.61–1.24)
GFR calc Af Amer: >60 mL/min (ref >60)
GFR calc non Af Amer: 57 mL/min — ABNORMAL LOW (ref >60)
Glucose, Bld: 178 mg/dL — ABNORMAL HIGH (ref 65–99)
Potassium: 3.4 mmol/L — ABNORMAL LOW (ref 3.5–5.1)
SODIUM: 140 mmol/L (ref 135–145)
TOTAL PROTEIN: 6.5 g/dL (ref 6.5–8.1)

## 2015-03-24 LAB — TSH: TSH: 0.703 u[IU]/mL (ref 0.350–4.500)

## 2015-03-24 LAB — PHOSPHORUS: Phosphorus: 3 mg/dL (ref 2.5–4.6)

## 2015-03-24 LAB — MAGNESIUM: MAGNESIUM: 2 mg/dL (ref 1.7–2.4)

## 2015-03-24 MED ORDER — ALLOPURINOL 100 MG PO TABS
100.0000 mg | ORAL_TABLET | Freq: Two times a day (BID) | ORAL | Status: DC
  Administered 2015-03-24 – 2015-03-25 (×3): 100 mg via ORAL
  Filled 2015-03-24 (×4): qty 1

## 2015-03-24 MED ORDER — ENOXAPARIN SODIUM 40 MG/0.4ML ~~LOC~~ SOLN
40.0000 mg | SUBCUTANEOUS | Status: DC
  Administered 2015-03-24 – 2015-03-25 (×2): 40 mg via SUBCUTANEOUS
  Filled 2015-03-24 (×2): qty 0.4

## 2015-03-24 MED ORDER — PREDNISONE 20 MG PO TABS
40.0000 mg | ORAL_TABLET | Freq: Two times a day (BID) | ORAL | Status: DC
  Administered 2015-03-24 (×2): 40 mg via ORAL
  Filled 2015-03-24 (×3): qty 2

## 2015-03-24 MED ORDER — MEMANTINE HCL 10 MG PO TABS
10.0000 mg | ORAL_TABLET | Freq: Two times a day (BID) | ORAL | Status: DC
  Administered 2015-03-24 – 2015-03-25 (×3): 10 mg via ORAL
  Filled 2015-03-24 (×4): qty 1

## 2015-03-24 MED ORDER — HYDROCODONE-ACETAMINOPHEN 5-325 MG PO TABS
1.0000 | ORAL_TABLET | Freq: Three times a day (TID) | ORAL | Status: DC | PRN
  Administered 2015-03-24: 1 via ORAL
  Filled 2015-03-24: qty 1

## 2015-03-24 MED ORDER — CLONAZEPAM 1 MG PO TABS
1.0000 mg | ORAL_TABLET | Freq: Every day | ORAL | Status: DC
  Administered 2015-03-24: 1 mg via ORAL
  Filled 2015-03-24: qty 1

## 2015-03-24 MED ORDER — OXYBUTYNIN CHLORIDE 5 MG PO TABS
5.0000 mg | ORAL_TABLET | Freq: Every day | ORAL | Status: DC
  Administered 2015-03-24 (×2): 5 mg via ORAL
  Filled 2015-03-24 (×4): qty 1

## 2015-03-24 MED ORDER — ASPIRIN EC 81 MG PO TBEC
81.0000 mg | DELAYED_RELEASE_TABLET | Freq: Every day | ORAL | Status: DC
  Administered 2015-03-24 – 2015-03-25 (×2): 81 mg via ORAL
  Filled 2015-03-24 (×2): qty 1

## 2015-03-24 MED ORDER — ONDANSETRON HCL 4 MG PO TABS: 4.0000 mg | ORAL_TABLET | Freq: Four times a day (QID) | ORAL | Status: DC | PRN

## 2015-03-24 MED ORDER — ACETAMINOPHEN 325 MG PO TABS: 650.0000 mg | ORAL_TABLET | Freq: Four times a day (QID) | ORAL | Status: DC | PRN

## 2015-03-24 MED ORDER — GUAIFENESIN ER 600 MG PO TB12
600.0000 mg | ORAL_TABLET | Freq: Two times a day (BID) | ORAL | Status: DC
  Administered 2015-03-24 – 2015-03-25 (×3): 600 mg via ORAL
  Filled 2015-03-24 (×4): qty 1

## 2015-03-24 MED ORDER — LATANOPROST 0.005 % OP SOLN
1.0000 [drp] | Freq: Every day | OPHTHALMIC | Status: DC
  Administered 2015-03-24 (×2): 1 [drp] via OPHTHALMIC
  Filled 2015-03-24: qty 2.5

## 2015-03-24 MED ORDER — CETYLPYRIDINIUM CHLORIDE 0.05 % MT LIQD
7.0000 mL | Freq: Two times a day (BID) | OROMUCOSAL | Status: DC
  Administered 2015-03-24 (×2): 7 mL via OROMUCOSAL

## 2015-03-24 MED ORDER — SODIUM CHLORIDE 0.9 % IV SOLN
INTRAVENOUS | Status: DC
  Administered 2015-03-24: 01:00:00 via INTRAVENOUS

## 2015-03-24 MED ORDER — DEXTROSE 5 % IV SOLN
500.0000 mg | INTRAVENOUS | Status: DC
  Administered 2015-03-24: 500 mg via INTRAVENOUS
  Filled 2015-03-24 (×2): qty 500

## 2015-03-24 MED ORDER — IPRATROPIUM-ALBUTEROL 0.5-2.5 (3) MG/3ML IN SOLN
3.0000 mL | Freq: Two times a day (BID) | RESPIRATORY_TRACT | Status: DC
  Administered 2015-03-24 – 2015-03-25 (×2): 3 mL via RESPIRATORY_TRACT
  Filled 2015-03-24 (×2): qty 3

## 2015-03-24 MED ORDER — PREDNISONE 20 MG PO TABS
20.0000 mg | ORAL_TABLET | Freq: Every day | ORAL | Status: DC
  Administered 2015-03-25: 20 mg via ORAL
  Filled 2015-03-24 (×2): qty 1

## 2015-03-24 MED ORDER — METOPROLOL SUCCINATE ER 25 MG PO TB24
25.0000 mg | ORAL_TABLET | Freq: Every day | ORAL | Status: DC
  Administered 2015-03-24 – 2015-03-25 (×2): 25 mg via ORAL
  Filled 2015-03-24 (×2): qty 1

## 2015-03-24 MED ORDER — CHLORHEXIDINE GLUCONATE 0.12 % MT SOLN
15.0000 mL | Freq: Two times a day (BID) | OROMUCOSAL | Status: DC
  Administered 2015-03-24 – 2015-03-25 (×2): 15 mL via OROMUCOSAL
  Filled 2015-03-24 (×5): qty 15

## 2015-03-24 MED ORDER — FINASTERIDE 5 MG PO TABS
5.0000 mg | ORAL_TABLET | Freq: Every day | ORAL | Status: DC
  Administered 2015-03-24 – 2015-03-25 (×2): 5 mg via ORAL
  Filled 2015-03-24 (×2): qty 1

## 2015-03-24 MED ORDER — IPRATROPIUM BROMIDE 0.02 % IN SOLN
0.5000 mg | Freq: Four times a day (QID) | RESPIRATORY_TRACT | Status: DC
  Administered 2015-03-24 (×2): 0.5 mg via RESPIRATORY_TRACT
  Filled 2015-03-24 (×2): qty 2.5

## 2015-03-24 MED ORDER — POTASSIUM CHLORIDE CRYS ER 20 MEQ PO TBCR
40.0000 meq | EXTENDED_RELEASE_TABLET | Freq: Once | ORAL | Status: AC
  Administered 2015-03-24: 40 meq via ORAL
  Filled 2015-03-24: qty 2

## 2015-03-24 MED ORDER — VENLAFAXINE HCL 37.5 MG PO TABS
37.5000 mg | ORAL_TABLET | Freq: Two times a day (BID) | ORAL | Status: DC
  Administered 2015-03-24 – 2015-03-25 (×3): 37.5 mg via ORAL
  Filled 2015-03-24 (×4): qty 1

## 2015-03-24 MED ORDER — ACETAMINOPHEN 650 MG RE SUPP: 650.0000 mg | Freq: Four times a day (QID) | RECTAL | Status: DC | PRN

## 2015-03-24 MED ORDER — ONDANSETRON HCL 4 MG/2ML IJ SOLN: 4.0000 mg | Freq: Four times a day (QID) | INTRAMUSCULAR | Status: DC | PRN

## 2015-03-24 MED ORDER — CLONAZEPAM 1 MG PO TABS
2.0000 mg | ORAL_TABLET | Freq: Every day | ORAL | Status: DC
Start: 2015-03-24 — End: 2015-03-24
  Administered 2015-03-24: 2 mg via ORAL
  Filled 2015-03-24: qty 2

## 2015-03-24 MED ORDER — BUDESONIDE 0.5 MG/2ML IN SUSP
0.5000 mg | Freq: Two times a day (BID) | RESPIRATORY_TRACT | Status: DC
  Administered 2015-03-24 – 2015-03-25 (×3): 0.5 mg via RESPIRATORY_TRACT
  Filled 2015-03-24 (×3): qty 2

## 2015-03-24 MED ORDER — CILOSTAZOL 50 MG PO TABS
50.0000 mg | ORAL_TABLET | Freq: Two times a day (BID) | ORAL | Status: DC
  Administered 2015-03-24 – 2015-03-25 (×3): 50 mg via ORAL
  Filled 2015-03-24 (×4): qty 1

## 2015-03-24 MED ORDER — CEFTRIAXONE SODIUM IN DEXTROSE 20 MG/ML IV SOLN
1.0000 g | INTRAVENOUS | Status: DC
  Administered 2015-03-24: 1 g via INTRAVENOUS
  Filled 2015-03-24 (×2): qty 50

## 2015-03-24 NOTE — Progress Notes (Signed)
  Echocardiogram 2D Echocardiogram has been performed.  Logan Allen 03/24/2015, 3:46 PM

## 2015-03-24 NOTE — Care Management Note (Signed)
Case Management Note  Patient Details  Name: Logan Allen MRN: [id number] Date of Birth: March 29, 1924  Subjective/Objective:   81 yo male with COPD exacerbation                 Action/Plan:  Spoke with patient and daughter Kendrick Fries in the room. Patient lives at home alone and has hired caregivers for 8 hours a day. He has a walker, rollator, BSC, and elevated toilet seat. Discussed that PT will make recommendations regarding level of therapy and daughter and patient stated that they are open to placement for rehab if recommended. Await PT recommendations. Expected Discharge Date:                  Expected Discharge Plan:  Skilled Nursing Facility  In-House Referral:  Clinical Social Work  Discharge planning Services  CM Consult  Post Acute Care Choice:  NA Choice offered to:  NA  DME Arranged:    DME Agency:     HH Arranged:    [place] Agency:     Status of Service:  Completed, signed off  Medicare Important Message Given:  No Date Medicare IM Given:    Medicare IM give by:    Date Additional Medicare IM Given:    Additional Medicare Important Message give by:     If discussed at [place] of Stay Meetings, dates discussed:    Additional Comments:  Scot Dock, RN 03/24/2015, 1:42 PM

## 2015-03-24 NOTE — ED Notes (Signed)
Pt returned from ct

## 2015-03-24 NOTE — Clinical Social Work Placement (Signed)
   CLINICAL SOCIAL WORK PLACEMENT  NOTE  Date:  03/24/2015  Patient Details  Name: NIVAAN DICENZO MRN: [id number] Date of Birth: 05/02/1924  Clinical Social Work is seeking post-discharge placement for this patient at the [place] level of care (*CSW will initial, date and re-position this form in  chart as items are completed):  Yes   Patient/family provided with [facility] Work Department's list of facilities offering this level of care within the geographic area requested by the patient (or if unable, by the patient's family).  Yes   Patient/family informed of their freedom to choose among providers that offer the needed level of care, that participate in Medicare, Medicaid or managed care program needed by the patient, have an available bed and are willing to accept the patient.  Yes   Patient/family informed of [facility]'s ownership interest in [facility] and [facility], as well as of the fact that they are under no obligation to receive care at these facilities.  PASRR submitted to EDS on       PASRR number received on       Existing PASRR number confirmed on 03/24/15     FL2 transmitted to all facilities in geographic area requested by pt/family on 03/24/15     FL2 transmitted to all facilities within larger geographic area on       Patient informed that his/her managed care company has contracts with or will negotiate with certain facilities, including the following:            Patient/family informed of bed offers received.  Patient chooses bed at       Physician recommends and patient chooses bed at      Patient to be transferred to   on  .  Patient to be transferred to facility by       Patient family notified on   of transfer.  Name of family member notified:        PHYSICIAN       Additional Comment:    _______________________________________________ Ludwig Clarks, LCSW 03/24/2015, 3:28 PM

## 2015-03-24 NOTE — Progress Notes (Signed)
Patient Demographics  Logan Allen, is a 81 y.o. male, DOB - 02-19-1924, ZPH:[id number]  Admit date - 03/23/2015   Admitting Physician Toy Baker, MD  Outpatient Primary MD for the patient is Henrine Screws, MD  LOS - 1   Chief Complaint  Patient presents with  . Weakness  . Shortness of Breath  . Cough        Subjective:   Logan Allen today has, No headache, No chest pain, No abdominal pain - No Nausea, No new weakness tingling or numbness, No Cough - SOB.    Assessment & Plan    1. Acute bronchitis in a patient with COPD. Currently no wheezing, no shortness of breath or oxygen demand. Agree with empiric IV anti-biotic's, does have mild leukocytosis, he already feels better. Supportive care with oxygen and advise her treatments as needed. No signs of COPD exacerbation, rapidly taper steroids.   2. Essential hypertension continue beta blocker and monitor.   3. Gout. Continue allopurinol.   4. BPH. On alpha-blocker continue.   5. Dementia. Continue Namenda, Effexor. At risk for delirium. Minimize narcotics and benzodiazepine's.   6. CAD. Continue beta blocker, and a platelet medications for secondary prevention. No chest pain or acute issues.   7.? New Afib - goal will be rate controlled, check TSH which is stable, echo pending. Continue beta blocker. Request cardiology to evaluate. Poor candidate for anticoagulation due to dementia and extremely high fall risk. We'll defer to cardiology. Continue aspirin for now.      Code Status: Full  Family Communication: None present  Disposition Plan: To be decided   Consults  none   Procedures   CT head. Unremarkable.  Echogram ordered   DVT Prophylaxis  Lovenox   Lab Results  Component Value Date   PLT  PLATELET CLUMPS NOTED ON SMEAR, UNABLE TO ESTIMATE 03/24/2015    Medications  Scheduled Meds: . allopurinol  100 mg Oral BID  . aspirin EC  81 mg Oral Daily  . azithromycin  500 mg Intravenous Q24H  . budesonide  0.5 mg Nebulization BID  . cefTRIAXone (ROCEPHIN)  IV  1 g Intravenous Q24H  . cilostazol  50 mg Oral BID  . clonazePAM  2 mg Oral QHS  . enoxaparin (LOVENOX) injection  40 mg Subcutaneous Q24H  . finasteride  5 mg Oral Daily  . guaiFENesin  600 mg Oral BID  . ipratropium-albuterol  3 mL Nebulization BID  . latanoprost  1 drop Both Eyes QHS  . memantine  10 mg Oral BID  . metoprolol succinate  25 mg Oral Daily  . oxybutynin  5 mg Oral QHS  . potassium chloride  40 mEq Oral Once  . predniSONE  40 mg Oral BID  . venlafaxine  37.5 mg Oral BID   Continuous Infusions: . sodium chloride 75 mL/hr at 03/24/15 0054   PRN Meds:.acetaminophen **OR** acetaminophen, HYDROcodone-acetaminophen, ondansetron **OR** ondansetron (ZOFRAN) IV  Antibiotics    Anti-infectives    Start     Dose/Rate Route Frequency Ordered Stop   03/24/15 2200  cefTRIAXone (ROCEPHIN) 1 g in dextrose 5 % 50 mL IVPB - Premix     1 g 100 mL/hr over 30 Minutes Intravenous Every 24 hours 03/24/15 0044  03/24/15 2200  azithromycin (ZITHROMAX) 500 mg in dextrose 5 % 250 mL IVPB     500 mg 250 mL/hr over 60 Minutes Intravenous Every 24 hours 03/24/15 0044     03/23/15 2145  cefTRIAXone (ROCEPHIN) 1 g in dextrose 5 % 50 mL IVPB     1 g 100 mL/hr over 30 Minutes Intravenous  Once 03/23/15 2132 03/23/15 2345   03/23/15 2145  azithromycin (ZITHROMAX) 500 mg in dextrose 5 % 250 mL IVPB     500 mg 250 mL/hr over 60 Minutes Intravenous  Once 03/23/15 2132 03/23/15 2302        Objective:   Filed Vitals:   03/24/15 0530 03/24/15 0838 03/24/15 0844 03/24/15 1012  BP: 132/58   129/61  Pulse: 92   85  Temp:    98.5 F (36.9 C)  TempSrc: Oral   Oral  Resp:    20  Height:      SpO2: 94% 96% 96% 97%     Wt Readings from Last 3 Encounters:  08/20/12 79.1 kg (174 lb 6.1 oz)  11/17/10 87.998 kg (194 lb)  11/01/10 88.057 kg (194 lb 2.1 oz)     Intake/Output Summary (Last 24 hours) at 03/24/15 1040 Last data filed at 03/24/15 0500  Gross per 24 hour  Intake  307.5 ml  Output      0 ml  Net  307.5 ml     Physical Exam  Awake , pleasantly confused, No new F.N deficits, Normal affect [place].AT,PERRAL Supple Neck,No JVD, No cervical lymphadenopathy appriciated.  Symmetrical Chest wall movement, Good air movement bilaterally, CTAB RRR,No Gallops,Rubs or new Murmurs, No Parasternal Heave +ve B.Sounds, Abd Soft, No tenderness, No organomegaly appriciated, No rebound - guarding or rigidity. No Cyanosis, Clubbing or edema, No new Rash or bruise      Data Review   Micro Results No results found for this or any previous visit (from the past 240 hour(s)).  Radiology Reports Dg Chest 2 View  03/23/2015   CLINICAL DATA:  Two week history of generalized weakness, cough, and shortness of breath. Four day history of nausea and vomiting. Current history of COPD. Prior CABG.  EXAM: CHEST  2 VIEW  COMPARISON:  08/20/2012 and earlier.  FINDINGS: AP erect and lateral imaging was performed. Suboptimal inspiration. Sternotomy for CABG. Cardiac silhouette normal in size, unchanged. Thoracic aorta tortuous and atherosclerotic, unchanged. Hilar and mediastinal contours otherwise unremarkable. Prominent bronchovascular markings diffusely and mild central peribronchial thickening, similar prior. Lungs otherwise clear. No localized airspace consolidation. No pleural effusions. No pneumothorax. Normal pulmonary vascularity. Osteoporotic compression fracture of what I believe is T10, unchanged. Compression fracture of what I believe is L2 with prior augmentation.  IMPRESSION: Suboptimal inspiration. Stable chronic bronchitis and/or asthma. No acute cardiopulmonary disease.   Electronically Signed   By: Evangeline Dakin M.D.   On: 03/23/2015 18:57   Ct Head Wo Contrast  03/24/2015   CLINICAL DATA:  Recent falls. Failure to thrive. History of prostate cancer.  EXAM: CT HEAD WITHOUT CONTRAST  TECHNIQUE: Contiguous axial images were obtained from the base of the skull through the vertex without intravenous contrast.  COMPARISON:  05/23/2014; 10/04/2012  FINDINGS: Similar findings of advanced atrophy with diffuse sulcal prominence and centralized volume loss with commensurate ex vacuo dilatation of the ventricular system. Extensive nearly confluent periventricular hypodensities compatible microvascular ischemic disease. Bilateral basal ganglial calcifications. Given extensive background parenchymal abnormalities, there is no CT evidence of superimposed acute large territory infarct. No  intraparenchymal or extra-axial mass or hemorrhage. Unchanged size and configuration of the ventricles and basilar cisterns. No midline shift. Intracranial atherosclerosis. Limited visualization the paranasal sinuses and mastoid air cells is normal. No air-fluid levels. Regional soft tissues appear normal. Post bilateral cataract surgery. No displaced calvarial fracture.  IMPRESSION: Similar findings of advanced atrophy and microvascular ischemic disease without acute intracranial process.   Electronically Signed   By: Sandi Mariscal M.D.   On: 03/24/2015 01:11     CBC  Recent Labs Lab 03/23/15 1825 03/24/15 0510  WBC 14.3* 13.1*  HGB 11.4* 10.5*  HCT 35.5* 33.6*  PLT 179 PLATELET CLUMPS NOTED ON SMEAR, UNABLE TO ESTIMATE  MCV 82.0 81.8  MCH 26.3 25.5*  MCHC 32.1 31.3  RDW 16.6* 16.7*  LYMPHSABS 0.9  --   MONOABS 1.1*  --   EOSABS 0.1  --   BASOSABS 0.0  --     Chemistries   Recent Labs Lab 03/23/15 1825 03/24/15 0510  NA 138 140  K 4.0 3.4*  CL 104 104  CO2 24 23  GLUCOSE 128* 178*  BUN 16 19  CREATININE 1.07 1.10  CALCIUM 8.6* 8.8*  MG  --  2.0  AST  --  20  ALT  --  17  ALKPHOS  --  81  BILITOT  --   0.2*   ------------------------------------------------------------------------------------------------------------------ CrCl cannot be calculated (Unknown ideal weight.). ------------------------------------------------------------------------------------------------------------------ No results for input(s): HGBA1C in the last 72 hours. ------------------------------------------------------------------------------------------------------------------ No results for input(s): CHOL, HDL, LDLCALC, TRIG, CHOLHDL, LDLDIRECT in the last 72 hours. ------------------------------------------------------------------------------------------------------------------  Recent Labs  03/24/15 0510  TSH 0.703   ------------------------------------------------------------------------------------------------------------------ No results for input(s): VITAMINB12, FOLATE, FERRITIN, TIBC, IRON, RETICCTPCT in the last 72 hours.  Coagulation profile No results for input(s): INR, PROTIME in the last 168 hours.  No results for input(s): DDIMER in the last 72 hours.  Cardiac Enzymes No results for input(s): CKMB, TROPONINI, MYOGLOBIN in the last 168 hours.  Invalid input(s): CK ------------------------------------------------------------------------------------------------------------------ Invalid input(s): POCBNP   Time Spent in minutes   35   Denise Bramblett K M.D on 03/24/2015 at 10:40 AM  Between 7am to 7pm - Pager - [phone]  After 7pm go to [email or web address] - password [facility]  [facility]   Office  [phone]
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
## 2015-03-24 NOTE — Evaluation (Signed)
Occupational Therapy Evaluation Patient Details Name: Logan Allen MRN: [id number] DOB: 1924-04-26 Today's Date: 03/24/2015    History of Present Illness 81 y/o male admitted for acute bronchitis, new onset afib, Cardiology consulted    PMhx: CAD, aortic valve disease, HTN, hypothyroidism, dementia with recurrent falls and COPD    Clinical Impression   Pt was admitted for the above.  He will benefit from skilled OT focusing on safety with toileting and grooming. At baseline, he has assistance for ADLs.  Goals are for supervision level in acute and he currently needs min A    Follow Up Recommendations  SNF    Equipment Recommendations  3 in 1 bedside comode (if pt doesn't have)    Recommendations for Other Services       Precautions / Restrictions Precautions Precautions: Fall Restrictions Weight Bearing Restrictions: No      Mobility Bed Mobility Overal bed mobility: Needs Assistance Bed Mobility: Supine to Sit     Supine to sit: HOB elevated;Supervision     General bed mobility comments: increased time  Transfers Overall transfer level: Needs assistance Equipment used: Rolling walker (2 wheeled) Transfers: Sit to/from Stand Sit to Stand: Min assist         General transfer comment: cues for hand placement; transfers repeated to take orthostatics, pt fatigues in static stand    Balance Overall balance assessment: Needs assistance Sitting-balance support: Feet supported;No upper extremity supported Sitting balance-Leahy Scale: Fair       Standing balance-Leahy Scale: Fair (pt has h/o recent falls)                              ADL Overall ADL's : Needs assistance/impaired             Lower Body Bathing: Minimal assistance;Sit to/from stand       Lower Body Dressing: Minimal assistance;Sit to/from stand   Toilet Transfer: Minimal assistance;BSC;Ambulation   Toileting- Clothing Manipulation and Hygiene: Minimal assistance;Sit  to/from stand         General ADL Comments: pt is able to perform UB adls with set up; min A for balance for LB adls and to reach to feet.  Pt was able to pull up and adjust socks.  Cues and physical assistance given to keep walker close to him; he walked too far behind it as he is used to rollator     Vision     Perception     Praxis      Pertinent Vitals/Pain Pain Assessment: No/denies pain     Hand Dominance     Extremity/Trunk Assessment Upper Extremity Assessment Upper Extremity Assessment: Overall WFL for tasks assessed      Cervical / Trunk Assessment Cervical / Trunk Assessment: Kyphotic   Communication Communication Communication: No difficulties   Cognition Arousal/Alertness: Awake/alert Behavior During Therapy: WFL for tasks assessed/performed Overall Cognitive Status: History of cognitive impairments - at baseline                     General Comments       Exercises       Shoulder Instructions      Home Living Family/patient expects to be discharged to:: Private residence Living Arrangements: Alone Available Help at Discharge: Personal care attendant Type of Home: House Home Access: Stairs to enter [employer] of Steps: small threshold from garage   Home Layout: One level  Bathroom Toilet: Handicapped height     Home Equipment: Environmental consultant - 2 wheels;Walker - 4 wheels   Additional Comments: has aide 8 hours a day; assists with adls/food preparation as needed      Prior Functioning/Environment Level of Independence: Independent with assistive device(s)        Comments: requires assist with ADLs from aide (per dtr); pt reports independence with laundry, cooking etc.--not verifiied    OT Diagnosis: Generalized weakness   OT Problem List: Decreased strength;Decreased activity tolerance;Impaired balance (sitting and/or standing);Decreased cognition;Decreased safety awareness   OT Treatment/Interventions:  Self-care/ADL training;DME and/or AE instruction;Patient/family education;Balance training;Cognitive remediation/compensation    OT Goals(Current goals can be found in the care plan section) Acute Rehab OT Goals Patient Stated Goal: none stated by pt, dtr states possibly rehab if needed OT Goal Formulation: With patient Time For Goal Achievement: 03/31/15 Potential to Achieve Goals: Good ADL Goals Pt Will Perform Grooming: with supervision;standing Pt Will Transfer to Toilet: with supervision;bedside commode;ambulating Pt Will Perform Toileting - Clothing Manipulation and hygiene: sit to/from stand  OT Frequency: Min 2X/week   Barriers to D/C:            Co-evaluation PT/OT/SLP Co-Evaluation/Treatment: Yes Reason for Co-Treatment: For patient/therapist safety PT goals addressed during session: Mobility/safety with mobility OT goals addressed during session: ADL's and self-care      End of Session    Activity Tolerance: Patient limited by fatigue Patient left: in chair;with call bell/phone within reach;with family/visitor present   Time: 1110-1132 OT Time Calculation (min): 22 min Charges:  OT General Charges $OT Visit: 1 Procedure OT Evaluation $Initial OT Evaluation Tier I: 1 Procedure G-Codes:    [name] 04/22/15, 1:03 PM  Lesle Chris, OTR/L [phone] 22-Apr-2015

## 2015-03-24 NOTE — Clinical Social Work Note (Signed)
Clinical Social Work Assessment  Patient Details  Name: Logan Allen MRN: [id number] Date of Birth: 1924/05/31  Date of referral:  03/24/15               Reason for consult:  Discharge Planning                Permission sought to share information with:  Family Supports Permission granted to share information::  Yes, Verbal Permission Granted  Name::      (daughterKendrick Fries)  Agency::     Relationship::   (daughter/HCPOA)  Contact Information:     Housing/Transportation Living arrangements for the past 2 months:  Single Family Home Source of Information:  Adult Children Patient Interpreter Needed:  None Criminal Activity/Legal Involvement Pertinent to Current Situation/Hospitalization:  No - Comment as needed Significant Relationships:  Adult Children Lives with:  Self Do you feel safe going back to the place where you live?  Yes Need for family participation in patient care:  Yes (Comment)  Care giving concerns:  Patient lives alone and PT is recommending SNF at dc.   Social Worker assessment / plan:   CSW met with patient and contacted his daughter by phone to discuss SNF. Patient is agreeable- states he has been to [facility] in the past. Daughter prefers [facility] or [facility].  CSW discussed SNF search process as well as insurance approval which will be needed for SNF transfer. Both are agreeable to purusing SNF options. CSW will complete FL2 and PASARR for SNF search.  Employment status:  Retired Forensic scientist:  Medicare PT Recommendations:  [place] / Referral to community resources:  [place]  Patient/Family's Response to care:  Patient and daughter acknowledge that he will benefit from SNF at [facility]- he lives alone and would benefit from a short SNF stay.  Patient/Family's Understanding of and Emotional Response to Diagnosis, Current Treatment, and Prognosis:  Patient is too drowsy to discuss things further,  however his daughter understands and agrees with plans for her father. "he gets a bit confused". She is optimistic he can return home after a short SNF stay.  Emotional Assessment Appearance:  Appears stated age Attitude/Demeanor/Rapport:  Lethargic, Sedated Affect (typically observed):  Appropriate Orientation:  Oriented to Self, Oriented to  Time, Oriented to Place Alcohol / Substance use:  Not Applicable Psych involvement (Current and /or in the community):  No (Comment)  Discharge Needs  Concerns to be addressed:  Adjustment to Illness, Home Safety Concerns, Discharge Planning Concerns Readmission within the last 30 days:  No Current discharge risk:  None Barriers to Discharge:  No Barriers Identified   Ludwig Clarks, LCSW 03/24/2015, 3:20 PM

## 2015-03-24 NOTE — Consult Note (Signed)
Patient ID: Logan Allen MRN: [id number], DOB/AGE: 06/20/1924   Admit date: 03/23/2015   Primary Physician: Henrine Screws, MD Primary Cardiologist: Seen by Dr. Tamala Julian in the past  Pt. Profile:  81 y/o male with reported history of CAD, aortic valve disease, HTN, hypothyroidism, dementia with recurrent falls and COPD, admitted for acute bronchitis. Cardiology consulted for new onset atrial fibrillation.   Problem List  Past Medical History  Diagnosis Date  . Coronary artery disease   . Hypertension   . High cholesterol   . COPD (chronic obstructive pulmonary disease)   . Exertional dyspnea 08/20/2012  . History of blood transfusion 1994; 2004    "w/both bypass ORs"  . History of carotid endarterectomy 1994    right  . Recurrent falls     "last fall 08/15/2012" (08/20/2012)  . Arthritis     "I suffer all over my body; dr said it could be from arthritis" (08/20/2012)  . Chronic lower back pain   . Anxiety   . Mental disorder   . Prostate cancer 1988    radiation treatment  . Hypothyroidism   . OSA (obstructive sleep apnea)   . Gout     "thumb" (08/20/2012)  . Incontinence of urine     Past Surgical History  Procedure Laterality Date  . Appendectomy    . Cataract extraction w/ intraocular lens  implant, bilateral    . Coronary artery bypass graft  1994; 2004  . Prostatectomy    . Hernia repair      UHR  . Cardiac valve replacement  2004    AVR  . Orchiectomy  2001    bilateral  . Penile prosthesis  removal  2001  . Circumcision  2001     Allergies  Allergies  Allergen Reactions  . Percocet [Oxycodone-Acetaminophen] Other (See Comments)    delusional  . Zocor [Simvastatin]     Unknown   . Penicillins Rash    HPI  The patient is a 81 y/o male, who apparently has been seen in the past by Dr. Tamala Julian. Complete record of cardiac history not assessable in chart. Per PMH list, he has a h/o CAD, HTN, hypothyroidism, dementia with recurrent falls as  well as COPD. His daughter reports h/o CABG in 1994 and AVR in 2004.  He was admitted by IM for weakness and acute bronchitis on 5/25. Also found to be dehydrated. He was placed on Steroid taper, antibiotics, Albuterol PRN, scheduled Atrovent, Dulera and Mucinex. IVFs for dehydration. Cardiology consulted for  new-onset atrial fibrillation. TSH was checked and normal. 2D echo pending. Ventricular rate has remained well controlled. Asymptomatic. No chest pain.    Home Medications  Prior to Admission medications   Medication Sig Start Date End Date Taking? Authorizing Provider  allopurinol (ZYLOPRIM) 100 MG tablet Take 100 mg by mouth 2 (two) times daily.   Yes Historical Provider, MD  aspirin EC 81 MG tablet Take 81 mg by mouth daily.   Yes Historical Provider, MD  bimatoprost (LUMIGAN) 0.01 % SOLN Place 1 drop into both eyes at bedtime.   Yes Historical Provider, MD  budesonide (PULMICORT) 180 MCG/ACT inhaler Inhale 2 puffs into the lungs 2 (two) times daily.   Yes Historical Provider, MD  calcium-vitamin D (OSCAL WITH D) 500-200 MG-UNIT per tablet Take 1 tablet by mouth 2 (two) times daily.   Yes Historical Provider, MD  cilostazol (PLETAL) 50 MG tablet Take 50 mg by mouth 2 (two) times daily.  Yes Historical Provider, MD  clonazePAM (KLONOPIN) 2 MG tablet Take 2 mg by mouth at bedtime.   Yes Historical Provider, MD  finasteride (PROSCAR) 5 MG tablet Take 5 mg by mouth every morning.  02/04/15  Yes Historical Provider, MD  guaiFENesin (MUCINEX) 600 MG 12 hr tablet Take 600 mg by mouth 2 (two) times daily. For cough/congestion   Yes Historical Provider, MD  HYDROcodone-acetaminophen (NORCO/VICODIN) 5-325 MG per tablet Take 1 tablet by mouth every 8 (eight) hours. Takes on Tuesday Thursday Saturday and Sunday   Yes Historical Provider, MD  Melatonin 200 MCG TABS Take 300 mcg by mouth at bedtime.   Yes Historical Provider, MD  meloxicam (MOBIC) 15 MG tablet Take 15 mg by mouth See admin  instructions. Monday Wednesday Friday   Yes Historical Provider, MD  memantine (NAMENDA) 10 MG tablet Take 10 mg by mouth 2 (two) times daily.   Yes Historical Provider, MD  metoprolol succinate (TOPROL-XL) 25 MG 24 hr tablet Take 25 mg by mouth daily.   Yes Historical Provider, MD  oxybutynin (DITROPAN) 5 MG tablet Take 5 mg by mouth at bedtime.   Yes Historical Provider, MD  potassium chloride SA (K-DUR,KLOR-CON) 20 MEQ tablet Take 20 mEq by mouth daily.   Yes Historical Provider, MD  venlafaxine (EFFEXOR) 37.5 MG tablet Take 37.5 mg by mouth 2 (two) times daily.   Yes Historical Provider, MD  vitamin B-12 (CYANOCOBALAMIN) 1000 MCG tablet Take 1,000 mcg by mouth daily.   Yes Historical Provider, MD    Family History  Family History  Problem Relation Age of Onset  . Hypertension Father   . Depression Father     Social History  History   Social History  . Marital Status: Widowed    Spouse Name: N/A  . Number of Children: N/A  . Years of Education: N/A   Occupational History  . Not on file.   Social History Main Topics  . Smoking status: Former Smoker -- 2.50 packs/day for 55 years    Types: Cigarettes  . Smokeless tobacco: Never Used     Comment: 08/20/2012 "stopped smoking cigarettes ~ 1994"  . Alcohol Use: No  . Drug Use: No  . Sexual Activity: No   Other Topics Concern  . Not on file   Social History Narrative     Review of Systems General:  No chills, fever, night sweats or weight changes.  Cardiovascular:  No chest pain, dyspnea on exertion, edema, orthopnea, palpitations, paroxysmal nocturnal dyspnea. Dermatological: No rash, lesions/masses Respiratory: No cough, dyspnea Urologic: No hematuria, dysuria Abdominal:   No nausea, vomiting, diarrhea, bright red blood per rectum, melena, or hematemesis Neurologic:  No visual changes, wkns, changes in mental status. All other systems reviewed and are otherwise negative except as noted above.  Physical  Exam  Blood pressure 129/61, pulse 85, temperature 98.5 F (36.9 C), temperature source Oral, resp. rate 20, height 5\' 5"  (1.651 m), SpO2 97 %.  General: Pleasant, NAD Psych: Normal affect. Neuro: Alert and oriented X 3. Moves all extremities spontaneously. HEENT: Normal  Neck: Supple without bruits or JVD. Lungs:  Resp regular and unlabored, CTA. Heart: irregular irregular no s3, s4, or murmurs. Abdomen: Soft, non-tender, non-distended, BS + x 4.  Extremities: No clubbing, cyanosis or edema. DP/PT/Radials 2+ and equal bilaterally.  Labs  Troponin (Point of Care Test) No results for input(s): TROPIPOC in the last 72 hours. No results for input(s): CKTOTAL, CKMB, TROPONINI in the last 72 hours. Lab Results  Component Value Date   WBC 13.1* 03/24/2015   HGB 10.5* 03/24/2015   HCT 33.6* 03/24/2015   MCV 81.8 03/24/2015   PLT PLATELET CLUMPS NOTED ON SMEAR, UNABLE TO ESTIMATE 03/24/2015    Recent Labs Lab 03/24/15 0510  NA 140  K 3.4*  CL 104  CO2 23  BUN 19  CREATININE 1.10  CALCIUM 8.8*  PROT 6.5  BILITOT 0.2*  ALKPHOS 81  ALT 17  AST 20  GLUCOSE 178*   No results found for: CHOL, HDL, LDLCALC, TRIG Lab Results  Component Value Date   DDIMER * 07/17/2010    7.97        AT THE INHOUSE ESTABLISHED CUTOFF VALUE OF 0.48 ug/mL FEU, THIS ASSAY HAS BEEN DOCUMENTED IN THE LITERATURE TO HAVE A SENSITIVITY AND NEGATIVE PREDICTIVE VALUE OF AT LEAST 98 TO 99%.  THE TEST RESULT SHOULD BE CORRELATED WITH AN ASSESSMENT OF THE CLINICAL PROBABILITY OF DVT / VTE.     Radiology/Studies  Dg Chest 2 View  03/23/2015   CLINICAL DATA:  Two week history of generalized weakness, cough, and shortness of breath. Four day history of nausea and vomiting. Current history of COPD. Prior CABG.  EXAM: CHEST  2 VIEW  COMPARISON:  08/20/2012 and earlier.  FINDINGS: AP erect and lateral imaging was performed. Suboptimal inspiration. Sternotomy for CABG. Cardiac silhouette normal in size,  unchanged. Thoracic aorta tortuous and atherosclerotic, unchanged. Hilar and mediastinal contours otherwise unremarkable. Prominent bronchovascular markings diffusely and mild central peribronchial thickening, similar prior. Lungs otherwise clear. No localized airspace consolidation. No pleural effusions. No pneumothorax. Normal pulmonary vascularity. Osteoporotic compression fracture of what I believe is T10, unchanged. Compression fracture of what I believe is L2 with prior augmentation.  IMPRESSION: Suboptimal inspiration. Stable chronic bronchitis and/or asthma. No acute cardiopulmonary disease.   Electronically Signed   By: Evangeline Dakin M.D.   On: 03/23/2015 18:57   Ct Head Wo Contrast  03/24/2015   CLINICAL DATA:  Recent falls. Failure to thrive. History of prostate cancer.  EXAM: CT HEAD WITHOUT CONTRAST  TECHNIQUE: Contiguous axial images were obtained from the base of the skull through the vertex without intravenous contrast.  COMPARISON:  05/23/2014; 10/04/2012  FINDINGS: Similar findings of advanced atrophy with diffuse sulcal prominence and centralized volume loss with commensurate ex vacuo dilatation of the ventricular system. Extensive nearly confluent periventricular hypodensities compatible microvascular ischemic disease. Bilateral basal ganglial calcifications. Given extensive background parenchymal abnormalities, there is no CT evidence of superimposed acute large territory infarct. No intraparenchymal or extra-axial mass or hemorrhage. Unchanged size and configuration of the ventricles and basilar cisterns. No midline shift. Intracranial atherosclerosis. Limited visualization the paranasal sinuses and mastoid air cells is normal. No air-fluid levels. Regional soft tissues appear normal. Post bilateral cataract surgery. No displaced calvarial fracture.  IMPRESSION: Similar findings of advanced atrophy and microvascular ischemic disease without acute intracranial process.   Electronically  Signed   By: Sandi Mariscal M.D.   On: 03/24/2015 01:11    ECG  Atrial fibrillation with a CVR  Echocardiogram  Pending     ASSESSMENT AND PLAN  Active Problems:   Essential hypertension   Cough   Dementia   HTN (hypertension)   Adult failure to thrive   COPD exacerbation   Dehydration   1. Atrial Fibrillation: new onset, in the setting of acute bronchitis, dehydration and chronic anemia. Ventricular rate is well controlled. TSH is WNL. 2D echo pending. Correct hypokalemia with supplemental K. Continue BB therapy with  cardioselective metoprolol. CHA2DS2 VASc score is at least > 2 given age >77 and HTN. However, agree that he is a poor candidate for anticoagulation due to dementia and extremely high fall risk.  Continue aspirin for now. If use of bronchodilators, recommend Xopenex vs albuterol to reduce risk of reflex tachycardia. Continue on telemetry.     SignedLyda Jester, PA-C 03/24/2015, 11:34 AM  Patient examined chart reviewed.  Exam with dementia and SEM through prosthetic valve.  He is s/p CABG in 89 And redo with tissue AVR by Dr Servando Snare in 2004.  He is frail, falls and very elderly with dementia.  Not a candidate for anticoagulation.  Continue rate control with beta blocker.  Echo pending but probably has some calcification of tissue valve and elevated mean gradient.  Continue pulmicort and pulmonary toilet   Namenda and effexor do not seem to be particularly effective for his memory or affect  [employer]

## 2015-03-24 NOTE — Evaluation (Signed)
Physical Therapy Evaluation Patient Details Name: Logan Allen MRN: [id number] DOB: April 05, 1924 Today's Date: 03/24/2015   History of Present Illness  81 y/o male admitted for acute bronchitis, new onset afib, Cardiology consulted    PMhx: CAD, aortic valve disease, HTN, hypothyroidism, dementia with recurrent falls and COPD   Clinical Impression  Pt admitted with above diagnosis. Pt currently with functional limitations due to the deficits listed below (see PT Problem List).  Pt will benefit from skilled PT to increase their independence and safety with mobility to allow discharge to the venue listed below.  Pt pleasant and cooperative with therapy today     Follow Up Recommendations SNF    Equipment Recommendations  None recommended by PT    Recommendations for Other Services       Precautions / Restrictions Precautions Precautions: Fall      Mobility  Bed Mobility Overal bed mobility: Needs Assistance Bed Mobility: Supine to Sit     Supine to sit: HOB elevated;Supervision     General bed mobility comments: incr time  Transfers Overall transfer level: Needs assistance Equipment used: Rolling walker (2 wheeled) Transfers: Sit to/from Stand Sit to Stand: Min assist         General transfer comment: cues for hand placement; transfers repeated to take orthostatics, pt fatigues in static stand  Ambulation/Gait   Ambulation Distance (Feet): 40 Feet (10' more) Assistive device: Rolling walker (2 wheeled) Gait Pattern/deviations: Step-through pattern;Decreased stride length;Trunk flexed;Shuffle     General Gait Details: multi-modal cues for RW distance from self, posture and step length  Stairs            Wheelchair Mobility    Modified Rankin (Stroke Patients Only)       Balance Overall balance assessment: Needs assistance Sitting-balance support: Feet supported;No upper extremity supported Sitting balance-Leahy Scale: Fair       Standing  balance-Leahy Scale: Fair                               Pertinent Vitals/Pain Pain Assessment: No/denies pain    Home Living Family/patient expects to be discharged to:: Private residence   Available Help at Discharge: Personal care attendant (8hrs per day) Type of Home: House Home Access: Stairs to enter   [employer] of Steps: small threshold from garage Home Layout: One level Home Equipment: Environmental consultant - 2 wheels;Walker - 4 wheels Additional Comments: pt prefers rollator    Prior Function Level of Independence: Independent with assistive device(s)         Comments: requires assist with ADLs from aide (per dtr); pt reports independence with laundry, cooking etc.     Hand Dominance        Extremity/Trunk Assessment   Upper Extremity Assessment: Defer to OT evaluation           Lower Extremity Assessment: Generalized weakness      Cervical / Trunk Assessment: Kyphotic  Communication   Communication: No difficulties  Cognition Arousal/Alertness: Awake/alert Behavior During Therapy: WFL for tasks assessed/performed Overall Cognitive Status: History of cognitive impairments - at baseline                      General Comments      Exercises        Assessment/Plan    PT Assessment Patient needs continued PT services  PT Diagnosis Difficulty walking   PT Problem List Decreased strength;Decreased balance;Decreased mobility;Decreased  activity tolerance  PT Treatment Interventions DME instruction;Gait training;Functional mobility training;Therapeutic activities;Patient/family education;Therapeutic exercise   PT Goals (Current goals can be found in the Care Plan section) Acute Rehab PT Goals Patient Stated Goal: none stated by pt, dtr states possibly rehab if needed PT Goal Formulation: With patient/family Time For Goal Achievement: 04/07/15 Potential to Achieve Goals: Good    Frequency Min 3X/week   Barriers to discharge  Decreased caregiver support      Co-evaluation PT/OT/SLP Co-Evaluation/Treatment: Yes Reason for Co-Treatment: For patient/therapist safety PT goals addressed during session: Mobility/safety with mobility         End of Session Equipment Utilized During Treatment: Gait belt Activity Tolerance: Patient tolerated treatment well Patient left: with call bell/phone within reach;in chair;with family/visitor present;with chair alarm set (NT informed there was no chair alarm box in room) Nurse Communication: Mobility status         Time: 3734-2876 PT Time Calculation (min) (ACUTE ONLY): 29 min   Charges:   PT Evaluation $Initial PT Evaluation Tier I: 1 Procedure     PT G CodesKenyon Ana 04/16/15, 12:58 PM

## 2015-03-24 NOTE — Progress Notes (Signed)
PT obtained orthostatic vitals and stated were negative and pt not symptomatic.  Paged PT for specific data.

## 2015-03-25 DIAGNOSIS — I481 Persistent atrial fibrillation: Secondary | ICD-10-CM

## 2015-03-25 LAB — BASIC METABOLIC PANEL
Anion gap: 7 (ref 5–15)
BUN: 26 mg/dL — ABNORMAL HIGH (ref 6–20)
CO2: 26 mmol/L (ref 22–32)
Calcium: 8.7 mg/dL — ABNORMAL LOW (ref 8.9–10.3)
Chloride: 107 mmol/L (ref 101–111)
Creatinine, Ser: 1.21 mg/dL (ref 0.61–1.24)
GFR calc non Af Amer: 51 mL/min — ABNORMAL LOW (ref >60)
GFR, EST AFRICAN AMERICAN: 59 mL/min — AB (ref >60)
Glucose, Bld: 123 mg/dL — ABNORMAL HIGH (ref 65–99)
Potassium: 4.5 mmol/L (ref 3.5–5.1)
SODIUM: 140 mmol/L (ref 135–145)

## 2015-03-25 LAB — MAGNESIUM: Magnesium: 2.2 mg/dL (ref 1.7–2.4)

## 2015-03-25 MED ORDER — AZITHROMYCIN 500 MG PO TABS: 500.0000 mg | ORAL_TABLET | Freq: Every day | ORAL | Status: DC

## 2015-03-25 MED ORDER — CLONAZEPAM 0.5 MG PO TABS: 2.0000 mg | ORAL_TABLET | Freq: Every day | ORAL | Status: DC

## 2015-03-25 MED ORDER — HYDROCODONE-ACETAMINOPHEN 5-325 MG PO TABS: 1.0000 | ORAL_TABLET | Freq: Three times a day (TID) | ORAL | Status: DC

## 2015-03-25 NOTE — Discharge Summary (Signed)
Logan Allen, is a 81 y.o. male  DOB [birth year]  MRN [id number].  Admission date:  03/23/2015  Admitting Physician  Toy Baker, MD  Discharge Date:  03/25/2015   Primary MD  Henrine Screws, MD  Recommendations for primary care physician for things to follow:    Check CBC, CMP, 2 view CXR in 7 days   Admission Diagnosis  Dehydration [E86.0] COPD exacerbation [J44.1] Falls [W19.XXXA]   Discharge Diagnosis  Dehydration [E86.0] COPD exacerbation [J44.1] Falls [W19.XXXA]     Active Problems:   Essential hypertension   Cough   Dementia   HTN (hypertension)   Adult failure to thrive   COPD exacerbation   Dehydration   A-fib      Past Medical History  Diagnosis Date  . Coronary artery disease   . Hypertension   . High cholesterol   . COPD (chronic obstructive pulmonary disease)   . Exertional dyspnea 08/20/2012  . History of blood transfusion 1994; 2004    "w/both bypass ORs"  . History of carotid endarterectomy 1994    right  . Recurrent falls     "last fall 08/15/2012" (08/20/2012)  . Arthritis     "I suffer all over my body; dr said it could be from arthritis" (08/20/2012)  . Chronic lower back pain   . Anxiety   . Mental disorder   . Prostate cancer 1988    radiation treatment  . Hypothyroidism   . OSA (obstructive sleep apnea)   . Gout     "thumb" (08/20/2012)  . Incontinence of urine     Past Surgical History  Procedure Laterality Date  . Appendectomy    . Cataract extraction w/ intraocular lens  implant, bilateral    . Coronary artery bypass graft  1994; 2004  . Prostatectomy    . Hernia repair      UHR  . Cardiac valve replacement  2004    AVR  . Orchiectomy  2001    bilateral  . Penile prosthesis  removal  2001  . Circumcision  2001       History of  present illness and  Hospital Course:     Kindly see H&P for history of present illness and admission details, please review complete Labs, Consult reports and Test reports for all details in brief  HPI  from the history and physical done on the day of admission  Logan Allen is a 81 y.o. male has a past medical history of Coronary artery disease; Hypertension; High cholesterol; COPD (chronic obstructive pulmonary disease); Exertional dyspnea (08/20/2012); History of blood transfusion (1994; 2004); History of carotid endarterectomy (1994); Recurrent falls; Arthritis; Chronic lower back pain; Anxiety; Mental disorder; Prostate cancer (1988); Hypothyroidism; OSA (obstructive sleep apnea); Gout; and Incontinence of urine.   Presented with - for 2 weeks or so patienet have had cough and increased shortness of breath with some low grade fever. He has not been eating well. Family took him to PCP and he was given a Surveyor, quantity of antibiotic".  Patient have not been eating or drinking much, started to feel very weak and unable to ambulate. Family attempted to get him up to feed but he could not stand. EMS was called He was brought to ER. CXR showing COPD no infiltrate, no UTI but noted to have elevated WBC. Patient with some diminished air movement started on nebulizer with some response.   Hospitalist was called for admission for COPD exacerbation and failure to thrive   Hospital Course   1. Acute bronchitis in a patient with COPD. Currently no wheezing, no shortness of breath or oxygen demand. Stop IV anti-bite is transitioned to oral azithromycin for 4 more days. Supportive care with oxygen and advise her treatments as needed. No signs of COPD exacerbation, stop steroids.   2. Essential hypertension continue beta blocker and monitor.   3. Gout. Continue allopurinol.   4. BPH. On alpha-blocker continue.   5. Dementia. Continue Namenda, Effexor. At risk for delirium. Minimize narcotics and  benzodiazepine's.   6. CAD. Continue beta blocker along with 2 anti- platelet medications for secondary prevention. No chest pain or acute issues.   7.New Afib with history of bovine aortic valve(per patient) [place] VASC score - 2- goal will be rate controll, echo and TSH are both stable. Continue beta blocker. Seen by cardiology. Poor candidate for anticoagulation due to dementia and extremely high fall risk. Continue aspirin, risks and benefits explained to the patient in detail he accepts it.    Discharge Condition: Stable   Follow UP  Follow-up Information    Follow up with GATES,ROBERT NEVILL, MD. Schedule an appointment as soon as possible for a visit in 5 days.   Specialty:  Internal Medicine   Contact information:   [address] [place] [address] [phone]         Discharge Instructions  and  Discharge Medications      Discharge Instructions    Discharge instructions    Complete by:  As directed   Follow with Primary MD GATES,ROBERT NEVILL, MD in 5 days   Get CBC, CMP, 2 view Chest X ray checked  by SNF MD or Primary MD in 5 days.    Activity: As tolerated with Full fall precautions use walker/cane & assistance as needed   Disposition SNF   Diet: Heart Healthy with feeding assistance and aspiration precautions.  For Heart failure patients - Check your Weight same time everyday, if you gain over 2 pounds, or you develop in leg swelling, experience more shortness of breath or chest pain, call your Primary MD immediately. Follow Cardiac Low Salt Diet and 1.5 lit/day fluid restriction.   On your next visit with your primary care physician please Get Medicines reviewed and adjusted.   Please request your Prim.MD to go over all Hospital Tests and Procedure/Radiological results at the follow up, please get all Hospital records sent to your Prim MD by signing hospital release before you go home.   If you experience worsening of your  admission symptoms, develop shortness of breath, life threatening emergency, suicidal or homicidal thoughts you must seek medical attention immediately by calling 911 or calling your MD immediately  if symptoms less severe.  You Must read complete instructions/literature along with all the possible adverse reactions/side effects for all the Medicines you take and that have been prescribed to you. Take any new Medicines after you have completely understood and accpet all the possible adverse reactions/side effects.   Do not drive, operating heavy machinery,  perform activities at heights, swimming or participation in water activities or provide baby sitting services if your were admitted for syncope or siezures until you have seen by Primary MD or a Neurologist and advised to do so again.  Do not drive when taking Pain medications.    Do not take more than prescribed Pain, Sleep and Anxiety Medications  Special Instructions: If you have smoked or chewed Tobacco  in the last 2 yrs please stop smoking, stop any regular Alcohol  and or any Recreational drug use.  Wear Seat belts while driving.   Please note  You were cared for by a hospitalist during your hospital stay. If you have any questions about your discharge medications or the care you received while you were in the hospital after you are discharged, you can call the unit and asked to speak with the hospitalist on call if the hospitalist that took care of you is not available. Once you are discharged, your primary care physician will handle any further medical issues. Please note that NO REFILLS for any discharge medications will be authorized once you are discharged, as it is imperative that you return to your primary care physician (or establish a relationship with a primary care physician if you do not have one) for your aftercare needs so that they can reassess your need for medications and monitor your lab values.     Increase activity  slowly    Complete by:  As directed             Medication List    STOP taking these medications        meloxicam 15 MG tablet  Commonly known as:  MOBIC      TAKE these medications        allopurinol 100 MG tablet  Commonly known as:  ZYLOPRIM  Take 100 mg by mouth 2 (two) times daily.     aspirin EC 81 MG tablet  Take 81 mg by mouth daily.     azithromycin 500 MG tablet  Commonly known as:  ZITHROMAX  Take 1 tablet (500 mg total) by mouth daily. For 4 more days     bimatoprost 0.01 % Soln  Commonly known as:  LUMIGAN  Place 1 drop into both eyes at bedtime.     budesonide 180 MCG/ACT inhaler  Commonly known as:  PULMICORT  Inhale 2 puffs into the lungs 2 (two) times daily.     calcium-vitamin D 500-200 MG-UNIT per tablet  Commonly known as:  OSCAL WITH D  Take 1 tablet by mouth 2 (two) times daily.     cilostazol 50 MG tablet  Commonly known as:  PLETAL  Take 50 mg by mouth 2 (two) times daily.     clonazePAM 0.5 MG tablet  Commonly known as:  KLONOPIN  Take 4 tablets (2 mg total) by mouth at bedtime.     finasteride 5 MG tablet  Commonly known as:  PROSCAR  Take 5 mg by mouth every morning.     guaiFENesin 600 MG 12 hr tablet  Commonly known as:  MUCINEX  Take 600 mg by mouth 2 (two) times daily. For cough/congestion     HYDROcodone-acetaminophen 5-325 MG per tablet  Commonly known as:  NORCO/VICODIN  Take 1 tablet by mouth every 8 (eight) hours. Takes on Tuesday Thursday Saturday and Sunday     Melatonin 200 MCG Tabs  Take 300 mcg by mouth at bedtime.     memantine 10 MG  tablet  Commonly known as:  NAMENDA  Take 10 mg by mouth 2 (two) times daily.     metoprolol succinate 25 MG 24 hr tablet  Commonly known as:  TOPROL-XL  Take 25 mg by mouth daily.     oxybutynin 5 MG tablet  Commonly known as:  DITROPAN  Take 5 mg by mouth at bedtime.     potassium chloride SA 20 MEQ tablet  Commonly known as:  K-DUR,KLOR-CON  Take 20 mEq by mouth  daily.     venlafaxine 37.5 MG tablet  Commonly known as:  EFFEXOR  Take 37.5 mg by mouth 2 (two) times daily.     vitamin B-12 1000 MCG tablet  Commonly known as:  CYANOCOBALAMIN  Take 1,000 mcg by mouth daily.          Diet and Activity recommendation: See Discharge Instructions above   Consults obtained - None   Major procedures and Radiology Reports - PLEASE review detailed and final reports for all details, in brief -    TTE  - Left ventricle: The cavity size was normal. Wall thickness wasincreased in a pattern of mild LVH. Systolic function was normal.The estimated ejection fraction was in the range of 60% to 65%.Wall motion was normal; there were no regional wall motionabnormalities. Doppler parameters are consistent with highventricular filling pressure. - Aortic valve: A bioprosthesis was present and functioningnormally. Valve area (VTI): 2.05 cm^2. Valve area (Vmax): 1.92cm^2. Valve area (Vmean): 1.92 cm^2. - Mitral valve: Calcified annulus. There was mild regurgitation.Valve area by continuity equation (using LVOT flow): 1.76 cm^2. - Left atrium: The atrium was severely dilated. - Right ventricle: Systolic function was mildly reduced. - Tricuspid valve: There was moderate regurgitation. - Pulmonary arteries: PA peak pressure: 43 mm Hg (S).   Dg Chest 2 View  03/23/2015   CLINICAL DATA:  Two week history of generalized weakness, cough, and shortness of breath. Four day history of nausea and vomiting. Current history of COPD. Prior CABG.  EXAM: CHEST  2 VIEW  COMPARISON:  08/20/2012 and earlier.  FINDINGS: AP erect and lateral imaging was performed. Suboptimal inspiration. Sternotomy for CABG. Cardiac silhouette normal in size, unchanged. Thoracic aorta tortuous and atherosclerotic, unchanged. Hilar and mediastinal contours otherwise unremarkable. Prominent bronchovascular markings diffusely and mild central peribronchial thickening, similar prior. Lungs  otherwise clear. No localized airspace consolidation. No pleural effusions. No pneumothorax. Normal pulmonary vascularity. Osteoporotic compression fracture of what I believe is T10, unchanged. Compression fracture of what I believe is L2 with prior augmentation.  IMPRESSION: Suboptimal inspiration. Stable chronic bronchitis and/or asthma. No acute cardiopulmonary disease.   Electronically Signed   By: Evangeline Dakin M.D.   On: 03/23/2015 18:57   Ct Head Wo Contrast  03/24/2015   CLINICAL DATA:  Recent falls. Failure to thrive. History of prostate cancer.  EXAM: CT HEAD WITHOUT CONTRAST  TECHNIQUE: Contiguous axial images were obtained from the base of the skull through the vertex without intravenous contrast.  COMPARISON:  05/23/2014; 10/04/2012  FINDINGS: Similar findings of advanced atrophy with diffuse sulcal prominence and centralized volume loss with commensurate ex vacuo dilatation of the ventricular system. Extensive nearly confluent periventricular hypodensities compatible microvascular ischemic disease. Bilateral basal ganglial calcifications. Given extensive background parenchymal abnormalities, there is no CT evidence of superimposed acute large territory infarct. No intraparenchymal or extra-axial mass or hemorrhage. Unchanged size and configuration of the ventricles and basilar cisterns. No midline shift. Intracranial atherosclerosis. Limited visualization the paranasal sinuses and mastoid air cells is normal.  No air-fluid levels. Regional soft tissues appear normal. Post bilateral cataract surgery. No displaced calvarial fracture.  IMPRESSION: Similar findings of advanced atrophy and microvascular ischemic disease without acute intracranial process.   Electronically Signed   By: Sandi Mariscal M.D.   On: 03/24/2015 01:11    Micro Results      No results found for this or any previous visit (from the past 240 hour(s)).     Today   Subjective:   Logan Allen today has no headache,no  chest abdominal pain,no new weakness tingling or numbness, feels much better wants to go to SNF.  Objective:   Blood pressure 116/48, pulse 59, temperature 97.5 F (36.4 C), temperature source Oral, resp. rate 20, height 5\' 5"  (1.651 m), SpO2 95 %.   Intake/Output Summary (Last 24 hours) at 03/25/15 0935 Last data filed at 03/25/15 0500  Gross per 24 hour  Intake    300 ml  Output      0 ml  Net    300 ml    Exam Awake Alert, Oriented x 3, No new F.N deficits, Normal affect [place].AT,PERRAL Supple Neck,No JVD, No cervical lymphadenopathy appriciated.  Symmetrical Chest wall movement, Good air movement bilaterally, CTAB RRR,No Gallops,Rubs or new Murmurs, No Parasternal Heave +ve B.Sounds, Abd Soft, Non tender, No organomegaly appriciated, No rebound -guarding or rigidity. No Cyanosis, Clubbing or edema, No new Rash or bruise  Data Review   CBC w Diff: Lab Results  Component Value Date   WBC 13.1* 03/24/2015   HGB 10.5* 03/24/2015   HCT 33.6* 03/24/2015   PLT PLATELET CLUMPS NOTED ON SMEAR, UNABLE TO ESTIMATE 03/24/2015   LYMPHOPCT 7* 03/23/2015   MONOPCT 8 03/23/2015   EOSPCT 1 03/23/2015   BASOPCT 0 03/23/2015    CMP: Lab Results  Component Value Date   NA 140 03/25/2015   K 4.5 03/25/2015   CL 107 03/25/2015   CO2 26 03/25/2015   BUN 26* 03/25/2015   CREATININE 1.21 03/25/2015   PROT 6.5 03/24/2015   ALBUMIN 3.2* 03/24/2015   BILITOT 0.2* 03/24/2015   ALKPHOS 81 03/24/2015   AST 20 03/24/2015   ALT 17 03/24/2015  . Lab Results  Component Value Date   TSH 0.703 03/24/2015     Total Time in preparing paper work, data evaluation and todays exam - 35 minutes  Thurnell Lose M.D on 03/25/2015 at 9:35 AM  [facility]   Office  [phone]

## 2015-03-25 NOTE — Clinical Social Work Placement (Signed)
   CLINICAL SOCIAL WORK PLACEMENT  NOTE  Date:  03/25/2015  Patient Details  Name: Logan Allen MRN: [id number] Date of Birth: 29-Dec-1923  Clinical Social Work is seeking post-discharge placement for this patient at the [place] level of care (*CSW will initial, date and re-position this form in  chart as items are completed):  Yes   Patient/family provided with [place] Work Department's list of facilities offering this level of care within the geographic area requested by the patient (or if unable, by the patient's family).  Yes   Patient/family informed of their freedom to choose among providers that offer the needed level of care, that participate in Medicare, Medicaid or managed care program needed by the patient, have an available bed and are willing to accept the patient.  Yes   Patient/family informed of [facility]'s ownership interest in [facility] and [facility], as well as of the fact that they are under no obligation to receive care at these facilities.  PASRR submitted to EDS on       PASRR number received on       Existing PASRR number confirmed on 03/24/15     FL2 transmitted to all facilities in geographic area requested by pt/family on 03/24/15     FL2 transmitted to all facilities within larger geographic area on       Patient informed that his/her managed care company has contracts with or will negotiate with certain facilities, including the following:        Yes   Patient/family informed of bed offers received.  Patient chooses bed at [facility]     Physician recommends and patient chooses bed at      Patient to be transferred to [facility] on 03/25/15.  Patient to be transferred to facility by [facility]     Patient family notified on 03/25/15 of transfer.  Name of family member notified:  DAUGHTER     PHYSICIAN       Additional Comment: Pt / daughter are in agreement with plan for D/C to [facility]  today. [facility] transport is required. Pt is aware out of pocket costs may be  associated with [facility] transport. [employer] medicare has provided prior authorization for [facility]. NSG reviewed d/c summary, scripts, avs. Scripts included in d/c packet.    _______________________________________________ Luretha Rued, LCSW 03/25/2015, 3:37 PM

## 2015-03-25 NOTE — Progress Notes (Signed)
Patient Profile: 81 y/o male with history of CAD s/p CABG in 1994 with redo and AVR in 2004, HTN, hypothyroidism, dementia with h/o recurrent falls and COPD, admitted for acute bronchitis and dehydration. Cardiology consulted for new onset atrial fibrillation.   Subjective: Doing well. No complaints. Denies CP, dyspnea and palpitations.   Objective: Vital signs in last 24 hours: Temp:  [97.5 F (36.4 C)-98.5 F (36.9 C)] 97.5 F (36.4 C) (05/27 0616) Pulse Rate:  [59-97] 59 (05/27 0616) Resp:  [17-20] 20 (05/27 0616) BP: (116-138)/(48-65) 116/48 mmHg (05/27 0616) SpO2:  [95 %-99 %] 99 % (05/27 0616) Last BM Date: 03/23/15  Intake/Output from previous day: 05/26 0701 - 05/27 0700 In: 300 [IV Piggyback:300] Out: -  Intake/Output this shift:    Medications Current Facility-Administered Medications  Medication Dose Route Frequency Provider Last Rate Last Dose  . acetaminophen (TYLENOL) tablet 650 mg  650 mg Oral Q6H PRN Toy Baker, MD       Or  . acetaminophen (TYLENOL) suppository 650 mg  650 mg Rectal Q6H PRN Toy Baker, MD      . allopurinol (ZYLOPRIM) tablet 100 mg  100 mg Oral BID Toy Baker, MD   100 mg at 03/24/15 2237  . antiseptic oral rinse (CPC / CETYLPYRIDINIUM CHLORIDE 0.05%) solution 7 mL  7 mL Mouth Rinse q12n4p Thurnell Lose, MD   7 mL at 03/24/15 1600  . aspirin EC tablet 81 mg  81 mg Oral Daily Toy Baker, MD   81 mg at 03/24/15 0950  . azithromycin (ZITHROMAX) 500 mg in dextrose 5 % 250 mL IVPB  500 mg Intravenous Q24H Toy Baker, MD   500 mg at 03/24/15 2243  . budesonide (PULMICORT) nebulizer solution 0.5 mg  0.5 mg Nebulization BID Toy Baker, MD   0.5 mg at 03/24/15 2022  . cefTRIAXone (ROCEPHIN) 1 g in dextrose 5 % 50 mL IVPB - Premix  1 g Intravenous Q24H Toy Baker, MD   1 g at 03/24/15 2237  . chlorhexidine (PERIDEX) 0.12 % solution 15 mL  15 mL Mouth Rinse BID Thurnell Lose, MD   15 mL at  03/24/15 1420  . cilostazol (PLETAL) tablet 50 mg  50 mg Oral BID Toy Baker, MD   50 mg at 03/24/15 2237  . clonazePAM (KLONOPIN) tablet 1 mg  1 mg Oral QHS Thurnell Lose, MD   1 mg at 03/24/15 2237  . enoxaparin (LOVENOX) injection 40 mg  40 mg Subcutaneous Q24H Toy Baker, MD   40 mg at 03/24/15 0950  . finasteride (PROSCAR) tablet 5 mg  5 mg Oral Daily Toy Baker, MD   5 mg at 03/24/15 0950  . guaiFENesin (MUCINEX) 12 hr tablet 600 mg  600 mg Oral BID Toy Baker, MD   600 mg at 03/24/15 2237  . HYDROcodone-acetaminophen (NORCO/VICODIN) 5-325 MG per tablet 1 tablet  1 tablet Oral Q8H PRN Toy Baker, MD   1 tablet at 03/24/15 2334  . ipratropium-albuterol (DUONEB) 0.5-2.5 (3) MG/3ML nebulizer solution 3 mL  3 mL Nebulization BID Thurnell Lose, MD   3 mL at 03/24/15 2022  . latanoprost (XALATAN) 0.005 % ophthalmic solution 1 drop  1 drop Both Eyes QHS Toy Baker, MD   1 drop at 03/24/15 2238  . memantine (NAMENDA) tablet 10 mg  10 mg Oral BID Toy Baker, MD   10 mg at 03/24/15 2237  . metoprolol succinate (TOPROL-XL) 24 hr tablet 25 mg  25 mg  Oral Daily Toy Baker, MD   25 mg at 03/24/15 0950  . ondansetron (ZOFRAN) tablet 4 mg  4 mg Oral Q6H PRN Toy Baker, MD       Or  . ondansetron (ZOFRAN) injection 4 mg  4 mg Intravenous Q6H PRN Toy Baker, MD      . oxybutynin (DITROPAN) tablet 5 mg  5 mg Oral QHS Toy Baker, MD   5 mg at 03/24/15 2237  . predniSONE (DELTASONE) tablet 20 mg  20 mg Oral Q breakfast Thurnell Lose, MD      . venlafaxine [facility]) tablet 37.5 mg  37.5 mg Oral BID Toy Baker, MD   37.5 mg at 03/24/15 2237    PE: General appearance: alert, cooperative and no distress Neck: no carotid bruit and no JVD Lungs: clear to auscultation bilaterally Heart: irregularly irregular rhythm and regular rate Extremities: no LEE Pulses: 2+ and symmetric Skin: warm and dry Neurologic:  Grossly normal  Lab Results:   Recent Labs  03/23/15 1825 03/24/15 0510  WBC 14.3* 13.1*  HGB 11.4* 10.5*  HCT 35.5* 33.6*  PLT 179 PLATELET CLUMPS NOTED ON SMEAR, UNABLE TO ESTIMATE   BMET  Recent Labs  03/23/15 1825 03/24/15 0510 03/25/15 0547  NA 138 140 140  K 4.0 3.4* 4.5  CL 104 104 107  CO2 24 23 26   GLUCOSE 128* 178* 123*  BUN 16 19 26*  CREATININE 1.07 1.10 1.21  CALCIUM 8.6* 8.8* 8.7*   PT/INR No results for input(s): LABPROT, INR in the last 72 hours. Cholesterol No results for input(s): CHOL in the last 72 hours. Cardiac Panel (last 3 results) No results for input(s): CKTOTAL, CKMB, TROPONINI, RELINDX in the last 72 hours.   Studies/Results: 2D Echo 03/24/15 Study Conclusions  - Left ventricle: The cavity size was normal. Wall thickness was increased in a pattern of mild LVH. Systolic function was normal. The estimated ejection fraction was in the range of 60% to 65%. Wall motion was normal; there were no regional wall motion abnormalities. Doppler parameters are consistent with high ventricular filling pressure. - Aortic valve: A bioprosthesis was present and functioning normally. Valve area (VTI): 2.05 cm^2. Valve area (Vmax): 1.92 cm^2. Valve area (Vmean): 1.92 cm^2. - Mitral valve: Calcified annulus. There was mild regurgitation. Valve area by continuity equation (using LVOT flow): 1.76 cm^2. - Left atrium: The atrium was severely dilated. - Right ventricle: Systolic function was mildly reduced. - Tricuspid valve: There was moderate regurgitation. - Pulmonary arteries: PA peak pressure: 43 mm Hg (S).    Assessment/Plan  Active Problems:   Essential hypertension   Cough   Dementia   HTN (hypertension)   Adult failure to thrive   COPD exacerbation   Dehydration   A-fib  1. Atrial Fibrillation with CVR: new onset, in the setting of acute bronchitis, dehydration and chronic anemia. Ventricular rate is well controlled.  TSH is WNL. 2D echo demonstrates normal LVF with EF of 60-65%. No WMA. The LA is severely dilated. Hypokalemia has been corected with supplemental K. Continue BB therapy with cardioselective metoprolol. CHA2DS2 VASc score is at least > 2 given age >54 and HTN. However, agree that he is a poor candidate for anticoagulation due to dementia and extremely high fall risk. Continue aspirin for now. If use of bronchodilators, recommend Xopenex vs albuterol to reduce risk of reflex tachycardia. Continue on telemetry.   2. CAD: h/o CABG in 1994 with redo in 2004. Stable w/o angina. 2D echo shows normal  LVF and wall motion.   3. S/P AVR: tissue valve replacement in 2004. 2D echo showed bioprosthesis was present and functioning normally. Valve area (VTI): 2.05 cm^2. Valve area (Vmax): 1.92 cm^2. Valve area (Vmean): 1.92 cm^2.    LOS: 2 days    Brittainy M. Ladoris Gene 03/25/2015 7:44 AM  I have personally seen and examined this patient with Lyda Jester, PA-C. I agree with the assessment and plan as outlined above. Atrial fib with good rate control. Not a candidate for long term anti-coagulation. Continue ASA and beta blocker. We will sign off. Please call with questions.   Colena Ketterman 03/25/2015 11:44 AM

## 2015-03-25 NOTE — Discharge Instructions (Signed)
Follow with Primary MD GATES,ROBERT NEVILL, MD in 5 days   Get CBC, CMP, 2 view Chest X ray checked  by SNF MD or Primary MD in 5 days.    Activity: As tolerated with Full fall precautions use walker/cane & assistance as needed   Disposition SNF   Diet: Heart Healthy with feeding assistance and aspiration precautions.  For Heart failure patients - Check your Weight same time everyday, if you gain over 2 pounds, or you develop in leg swelling, experience more shortness of breath or chest pain, call your Primary MD immediately. Follow Cardiac Low Salt Diet and 1.5 lit/day fluid restriction.   On your next visit with your primary care physician please Get Medicines reviewed and adjusted.   Please request your Prim.MD to go over all Hospital Tests and Procedure/Radiological results at the follow up, please get all Hospital records sent to your Prim MD by signing hospital release before you go home.   If you experience worsening of your admission symptoms, develop shortness of breath, life threatening emergency, suicidal or homicidal thoughts you must seek medical attention immediately by calling 911 or calling your MD immediately  if symptoms less severe.  You Must read complete instructions/literature along with all the possible adverse reactions/side effects for all the Medicines you take and that have been prescribed to you. Take any new Medicines after you have completely understood and accpet all the possible adverse reactions/side effects.   Do not drive, operating heavy machinery, perform activities at heights, swimming or participation in water activities or provide baby sitting services if your were admitted for syncope or siezures until you have seen by Primary MD or a Neurologist and advised to do so again.  Do not drive when taking Pain medications.    Do not take more than prescribed Pain, Sleep and Anxiety Medications  Special Instructions: If you have smoked or chewed  Tobacco  in the last 2 yrs please stop smoking, stop any regular Alcohol  and or any Recreational drug use.  Wear Seat belts while driving.   Please note  You were cared for by a hospitalist during your hospital stay. If you have any questions about your discharge medications or the care you received while you were in the hospital after you are discharged, you can call the unit and asked to speak with the hospitalist on call if the hospitalist that took care of you is not available. Once you are discharged, your primary care physician will handle any further medical issues. Please note that NO REFILLS for any discharge medications will be authorized once you are discharged, as it is imperative that you return to your primary care physician (or establish a relationship with a primary care physician if you do not have one) for your aftercare needs so that they can reassess your need for medications and monitor your lab values.

## 2015-03-25 NOTE — Progress Notes (Signed)
[facility] and gave report to [employer]. Irven Baltimore, RN

## 2015-03-29 ENCOUNTER — Encounter: Payer: Self-pay | Admitting: Adult Health

## 2015-03-29 ENCOUNTER — Non-Acute Institutional Stay (SKILLED_NURSING_FACILITY): Payer: Commercial Managed Care - HMO | Admitting: Adult Health

## 2015-03-29 DIAGNOSIS — I4819 Other persistent atrial fibrillation: Secondary | ICD-10-CM

## 2015-03-29 DIAGNOSIS — F419 Anxiety disorder, unspecified: Secondary | ICD-10-CM

## 2015-03-29 DIAGNOSIS — J42 Unspecified chronic bronchitis: Secondary | ICD-10-CM

## 2015-03-29 DIAGNOSIS — N400 Enlarged prostate without lower urinary tract symptoms: Secondary | ICD-10-CM

## 2015-03-29 DIAGNOSIS — R5381 Other malaise: Secondary | ICD-10-CM

## 2015-03-29 DIAGNOSIS — F0390 Unspecified dementia without behavioral disturbance: Secondary | ICD-10-CM | POA: Diagnosis not present

## 2015-03-29 DIAGNOSIS — I10 Essential (primary) hypertension: Secondary | ICD-10-CM

## 2015-03-29 DIAGNOSIS — G4700 Insomnia, unspecified: Secondary | ICD-10-CM | POA: Diagnosis not present

## 2015-03-29 DIAGNOSIS — I2510 Atherosclerotic heart disease of native coronary artery without angina pectoris: Secondary | ICD-10-CM | POA: Diagnosis not present

## 2015-03-29 DIAGNOSIS — M109 Gout, unspecified: Secondary | ICD-10-CM

## 2015-03-29 DIAGNOSIS — R32 Unspecified urinary incontinence: Secondary | ICD-10-CM

## 2015-03-29 DIAGNOSIS — F329 Major depressive disorder, single episode, unspecified: Secondary | ICD-10-CM | POA: Diagnosis not present

## 2015-03-29 DIAGNOSIS — E876 Hypokalemia: Secondary | ICD-10-CM | POA: Diagnosis not present

## 2015-03-29 DIAGNOSIS — F32A Depression, unspecified: Secondary | ICD-10-CM

## 2015-03-29 DIAGNOSIS — I481 Persistent atrial fibrillation: Secondary | ICD-10-CM

## 2015-03-29 NOTE — Progress Notes (Signed)
Patient ID: Logan Allen, male   DOB: [birth year], 81 y.o.   MRN: [id number]   03/29/2015  Facility:  Nursing Home Location:  [place] Room Number: 105-P LEVEL OF CARE:  SNF (31)    Chief Complaint  Patient presents with  . Hospitalization Follow-up    Physical deconditioning, acute bronchitis, hypertension, gout, BPH, dementia, depression, CAD, atrial fibrillation, insomnia and anxiety    HISTORY OF PRESENT ILLNESS:  This is an 81 year old male who has been admitted to [facility] on 03/25/15 from [facility]. He has PMH of CAD, hypertension, hyperlipidemia, COPD, exertional dyspnea, prostate cancer, hypothyroidism, gout and urinary incontinence. He presented to the ED with cough, SOB, low-grade fever and poor appetite. Family to patient to PCP and was given "a shot of antibiotic." Chest x-ray in the ED showed COPD, no infiltrate and elevated WBC. He was treated for Acute bronchitis and discharged with Azithromycin.   He has been admitted for a short-term rehabilitation.  PAST MEDICAL HISTORY:  Past Medical History  Diagnosis Date  . Coronary artery disease   . Hypertension   . High cholesterol   . COPD (chronic obstructive pulmonary disease)   . Exertional dyspnea 08/20/2012  . History of blood transfusion 1994; 2004    "w/both bypass ORs"  . History of carotid endarterectomy 1994    right  . Recurrent falls     "last fall 08/15/2012" (08/20/2012)  . Arthritis     "I suffer all over my body; dr said it could be from arthritis" (08/20/2012)  . Chronic lower back pain   . Anxiety   . Mental disorder   . Prostate cancer 1988    radiation treatment  . Hypothyroidism   . OSA (obstructive sleep apnea)   . Gout     "thumb" (08/20/2012)  . Incontinence of urine     CURRENT MEDICATIONS: Reviewed per MAR/see medication list  Allergies  Allergen Reactions  . Percocet [Oxycodone-Acetaminophen] Other (See Comments)    delusional  .  Zocor [Simvastatin]     Unknown   . Penicillins Rash     REVIEW OF SYSTEMS:  GENERAL: no change in appetite, no fatigue, no weight changes, no fever, chills or weakness RESPIRATORY: no cough, SOB, DOE, wheezing, hemoptysis CARDIAC: no chest pain, edema or palpitations GI: no abdominal pain, diarrhea, constipation, heart burn, nausea or vomiting  PHYSICAL EXAMINATION  GENERAL: no acute distress, normal body habitus EYES: conjunctivae normal, sclerae normal, normal eye lids NECK: supple, trachea midline, no neck masses, no thyroid tenderness, no thyromegaly LYMPHATICS: no LAN in the neck, no supraclavicular LAN RESPIRATORY: breathing is even & unlabored, BS CTAB CARDIAC: irregular heart rate, no murmur,no extra heart sounds, no edema GI: abdomen soft, normal BS, no masses, no tenderness, no hepatomegaly, no splenomegaly EXTREMITIES: Able to move 4 extremities PSYCHIATRIC: the patient is alert & oriented to person, affect & behavior appropriate  LABS/RADIOLOGY: Labs reviewed: Basic Metabolic Panel:  Recent Labs  03/23/15 1825 03/24/15 0510 03/25/15 0547  NA 138 140 140  K 4.0 3.4* 4.5  CL 104 104 107  CO2 24 23 26   GLUCOSE 128* 178* 123*  BUN 16 19 26*  CREATININE 1.07 1.10 1.21  CALCIUM 8.6* 8.8* 8.7*  MG  --  2.0 2.2  PHOS  --  3.0  --    Liver Function Tests:  Recent Labs  03/24/15 0510  AST 20  ALT 17  ALKPHOS 81  BILITOT 0.2*  PROT  6.5  ALBUMIN 3.2*   CBC:  Recent Labs  03/23/15 1825 03/24/15 0510  WBC 14.3* 13.1*  NEUTROABS 12.1*  --   HGB 11.4* 10.5*  HCT 35.5* 33.6*  MCV 82.0 81.8  PLT 179 PLATELET CLUMPS NOTED ON SMEAR, UNABLE TO ESTIMATE    Dg Chest 2 View  03/23/2015   CLINICAL DATA:  Two week history of generalized weakness, cough, and shortness of breath. Four day history of nausea and vomiting. Current history of COPD. Prior CABG.  EXAM: CHEST  2 VIEW  COMPARISON:  08/20/2012 and earlier.  FINDINGS: AP erect and lateral imaging was  performed. Suboptimal inspiration. Sternotomy for CABG. Cardiac silhouette normal in size, unchanged. Thoracic aorta tortuous and atherosclerotic, unchanged. Hilar and mediastinal contours otherwise unremarkable. Prominent bronchovascular markings diffusely and mild central peribronchial thickening, similar prior. Lungs otherwise clear. No localized airspace consolidation. No pleural effusions. No pneumothorax. Normal pulmonary vascularity. Osteoporotic compression fracture of what I believe is T10, unchanged. Compression fracture of what I believe is L2 with prior augmentation.  IMPRESSION: Suboptimal inspiration. Stable chronic bronchitis and/or asthma. No acute cardiopulmonary disease.   Electronically Signed   By: Evangeline Dakin M.D.   On: 03/23/2015 18:57   Ct Head Wo Contrast  03/24/2015   CLINICAL DATA:  Recent falls. Failure to thrive. History of prostate cancer.  EXAM: CT HEAD WITHOUT CONTRAST  TECHNIQUE: Contiguous axial images were obtained from the base of the skull through the vertex without intravenous contrast.  COMPARISON:  05/23/2014; 10/04/2012  FINDINGS: Similar findings of advanced atrophy with diffuse sulcal prominence and centralized volume loss with commensurate ex vacuo dilatation of the ventricular system. Extensive nearly confluent periventricular hypodensities compatible microvascular ischemic disease. Bilateral basal ganglial calcifications. Given extensive background parenchymal abnormalities, there is no CT evidence of superimposed acute large territory infarct. No intraparenchymal or extra-axial mass or hemorrhage. Unchanged size and configuration of the ventricles and basilar cisterns. No midline shift. Intracranial atherosclerosis. Limited visualization the paranasal sinuses and mastoid air cells is normal. No air-fluid levels. Regional soft tissues appear normal. Post bilateral cataract surgery. No displaced calvarial fracture.  IMPRESSION: Similar findings of advanced atrophy  and microvascular ischemic disease without acute intracranial process.   Electronically Signed   By: Sandi Mariscal M.D.   On: 03/24/2015 01:11    ASSESSMENT/PLAN:  Physical deconditioning - for rehabilitation Acute bronchitis - continue azithromycin 500 mg by mouth daily 4 days, Mucinex 600 mg by mouth twice a day and Pulmicort 2 puffs into lungs twice a day Hypertension - well controlled; continue Toprol-XL 25 mg by mouth daily Gout - continue allopurinol 100 mg by mouth twice a day BPH - continue Proscar 5 mg by mouth every morning Dementia - Namenda 10 mg by mouth twice a day Depression - continue Effexor 37.5 mg by mouth twice a day CAD - stable; continue Pletal 50 mg by mouth twice a day, Toprol-XL 25 mg by mouth daily and aspirin 81 mg by mouth daily Atrial fibrillation - rate controlled; not a candidate for anticoagulation due to high risk for falls; continue Toprol-XL 25 mg by mouth daily Anxiety - mood is stable; continue Klonopin 2 mg by mouth daily at bedtime Insomnia - continue melatonin 300 g by mouth daily at bedtime Hypokalemia - K4.5; discontinue KCl Urinary incontinence - continue oxybutynin 5 mg 1 tab by mouth daily at bedtime    Goals of care:  Short-term rehabilitation   Labs/test ordered:  For CBC, CMP and 2 view chest x-ray on  04/01/15   Spent 50 minutes in patient care.    [facility], NP [employer] [phone]

## 2015-04-01 ENCOUNTER — Encounter: Payer: Self-pay | Admitting: Internal Medicine

## 2015-04-01 ENCOUNTER — Non-Acute Institutional Stay: Payer: Commercial Managed Care - HMO | Admitting: Internal Medicine

## 2015-04-01 DIAGNOSIS — F0390 Unspecified dementia without behavioral disturbance: Secondary | ICD-10-CM | POA: Diagnosis not present

## 2015-04-01 DIAGNOSIS — F418 Other specified anxiety disorders: Secondary | ICD-10-CM

## 2015-04-01 DIAGNOSIS — I481 Persistent atrial fibrillation: Secondary | ICD-10-CM

## 2015-04-01 DIAGNOSIS — R5381 Other malaise: Secondary | ICD-10-CM | POA: Diagnosis not present

## 2015-04-01 DIAGNOSIS — I4819 Other persistent atrial fibrillation: Secondary | ICD-10-CM

## 2015-04-01 DIAGNOSIS — D638 Anemia in other chronic diseases classified elsewhere: Secondary | ICD-10-CM

## 2015-04-01 DIAGNOSIS — I10 Essential (primary) hypertension: Secondary | ICD-10-CM | POA: Diagnosis not present

## 2015-04-01 DIAGNOSIS — J209 Acute bronchitis, unspecified: Secondary | ICD-10-CM

## 2015-04-01 DIAGNOSIS — M1A9XX0 Chronic gout, unspecified, without tophus (tophi): Secondary | ICD-10-CM

## 2015-04-01 DIAGNOSIS — N400 Enlarged prostate without lower urinary tract symptoms: Secondary | ICD-10-CM | POA: Diagnosis not present

## 2015-04-01 DIAGNOSIS — D72829 Elevated white blood cell count, unspecified: Secondary | ICD-10-CM

## 2015-04-01 NOTE — Progress Notes (Signed)
Patient ID: Logan Allen, male   DOB: [birth year], 81 y.o.   MRN: [id number]    [place]  PCP: Henrine Screws, MD  Code Status: DNR  Allergies  Allergen Reactions  . Percocet [Oxycodone-Acetaminophen] Other (See Comments)    delusional  . Zocor [Simvastatin]     Unknown   . Penicillins Rash    Chief Complaint  Patient presents with  . New Admit To SNF    New Admission      HPI:  81 year old patient is here for short term rehabilitation post hospital admission from 03/23/15-03/25/15 with acute bronchitis. He was initially treated for copd exacerbation but later switched to oral antibiotic for acute bronchitis. He responded well to antibiotics and prednisone was discontinued prior to discharge. He has PMH of HTN, BPH, dementia, gout, afib, CAD. He is seen in his room today. He feels his strength is coming back. He is alert and oriented and denies any concerns  Review of Systems:  Constitutional: Negative for fever, chills, diaphoresis.  HENT: Negative for headache, congestion Eyes: Negative for eye pain, blurred vision, double vision and discharge.  Respiratory: Negative for wheezing.  positive for shortness of breath with exertion Cardiovascular: Negative for chest pain, palpitations, leg swelling.  Gastrointestinal: Negative for heartburn, nausea, vomiting, abdominal pain. Appetite is good. Had bowel movement yesterday Genitourinary: Negative for dysuria Musculoskeletal: Negative for back pain, falls Skin: Negative for itching, rash.  Neurological: Negative for dizziness Psychiatric/Behavioral: Negative for depression   Past Medical History  Diagnosis Date  . Coronary artery disease   . Hypertension   . High cholesterol   . COPD (chronic obstructive pulmonary disease)   . Exertional dyspnea 08/20/2012  . History of blood transfusion 1994; 2004    "w/both bypass ORs"  . History of carotid endarterectomy 1994    right  . Recurrent falls    "last fall 08/15/2012" (08/20/2012)  . Arthritis     "I suffer all over my body; dr said it could be from arthritis" (08/20/2012)  . Chronic lower back pain   . Anxiety   . Mental disorder   . Prostate cancer 1988    radiation treatment  . Hypothyroidism   . OSA (obstructive sleep apnea)   . Gout     "thumb" (08/20/2012)  . Incontinence of urine    Past Surgical History  Procedure Laterality Date  . Appendectomy    . Cataract extraction w/ intraocular lens  implant, bilateral    . Coronary artery bypass graft  1994; 2004  . Prostatectomy    . Hernia repair      UHR  . Cardiac valve replacement  2004    AVR  . Orchiectomy  2001    bilateral  . Penile prosthesis  removal  2001  . Circumcision  2001   Social History:   reports that he has quit smoking. His smoking use included Cigarettes. He has a 137.5 pack-year smoking history. He has never used smokeless tobacco. He reports that he does not drink alcohol or use illicit drugs.  Family History  Problem Relation Age of Onset  . Hypertension Father   . Depression Father     Medications: Patient's Medications  New Prescriptions   No medications on file  Previous Medications   ALLOPURINOL (ZYLOPRIM) 100 MG TABLET    Take 100 mg by mouth 2 (two) times daily.   ASPIRIN EC 81 MG TABLET    Take 81 mg by mouth daily.  BIMATOPROST (LUMIGAN) 0.01 % SOLN    Place 1 drop into both eyes at bedtime.   BUDESONIDE (PULMICORT) 180 MCG/ACT INHALER    Inhale 2 puffs into the lungs 2 (two) times daily.   CALCIUM-VITAMIN D (OSCAL WITH D) 500-200 MG-UNIT PER TABLET    Take 1 tablet by mouth 2 (two) times daily.   CILOSTAZOL (PLETAL) 50 MG TABLET    Take 50 mg by mouth 2 (two) times daily.   CLONAZEPAM (KLONOPIN) 2 MG TABLET    Take 2 mg by mouth at bedtime.   FINASTERIDE (PROSCAR) 5 MG TABLET    Take 5 mg by mouth every morning.    GUAIFENESIN (MUCINEX) 600 MG 12 HR TABLET    Take 600 mg by mouth 2 (two) times daily. For cough/congestion     HYDROCODONE-ACETAMINOPHEN (NORCO/VICODIN) 5-325 MG PER TABLET    Take 1 tablet by mouth every 8 (eight) hours. Takes on Tuesday Thursday Saturday and Sunday   MELATONIN 300 MCG TABS    Take by mouth at bedtime.   MEMANTINE (NAMENDA) 10 MG TABLET    Take 10 mg by mouth 2 (two) times daily.   METOPROLOL SUCCINATE (TOPROL-XL) 25 MG 24 HR TABLET    Take 25 mg by mouth daily.   OXYBUTYNIN (DITROPAN) 5 MG TABLET    Take 5 mg by mouth at bedtime.   VENLAFAXINE (EFFEXOR) 37.5 MG TABLET    Take 37.5 mg by mouth 2 (two) times daily.   VITAMIN B-12 (CYANOCOBALAMIN) 1000 MCG TABLET    Take 1,000 mcg by mouth daily.  Modified Medications   No medications on file  Discontinued Medications   AZITHROMYCIN (ZITHROMAX) 500 MG TABLET    Take 1 tablet (500 mg total) by mouth daily. For 4 more days   CLONAZEPAM (KLONOPIN) 0.5 MG TABLET    Take 4 tablets (2 mg total) by mouth at bedtime.   MELATONIN 200 MCG TABS    Take 300 mcg by mouth at bedtime.   POTASSIUM CHLORIDE SA (K-DUR,KLOR-CON) 20 MEQ TABLET    Take 20 mEq by mouth daily.     Physical Exam:  Filed Vitals:   04/01/15 1050  BP: 135/74  Pulse: 86  Temp: 97.1 F (36.2 C)  TempSrc: Oral  Resp: 17  Height: 5\' 9"  (1.753 m)  Weight: 177 lb 3.2 oz (80.377 kg)  SpO2: 98%    General- elderly male, in no acute distress Head- normocephalic, atraumatic Throat- moist mucus membrane  Eyes- no pallor, no icterus, no discharge, normal conjunctiva, normal sclera Neck- no cervical lymphadenopathy Cardiovascular- normal s1,s2, no murmurs, palpable dorsalis pedis, no leg edema Respiratory- bilateral clear to auscultation, no wheeze, no rhonchi, no crackles, no use of accessory muscles Abdomen- bowel sounds present, soft, non tender Musculoskeletal- able to move all 4 extremities, generalized weakness  Neurological- no focal deficit Skin- warm and dry Psychiatry- alert and oriented to person, place and time, normal mood and affect    Labs  reviewed: Basic Metabolic Panel:  Recent Labs  03/23/15 1825 03/24/15 0510 03/25/15 0547  NA 138 140 140  K 4.0 3.4* 4.5  CL 104 104 107  CO2 24 23 26   GLUCOSE 128* 178* 123*  BUN 16 19 26*  CREATININE 1.07 1.10 1.21  CALCIUM 8.6* 8.8* 8.7*  MG  --  2.0 2.2  PHOS  --  3.0  --    Liver Function Tests:  Recent Labs  03/24/15 0510  AST 20  ALT 17  ALKPHOS 81  BILITOT 0.2*  PROT 6.5  ALBUMIN 3.2*   No results for input(s): LIPASE, AMYLASE in the last 8760 hours. No results for input(s): AMMONIA in the last 8760 hours. CBC:  Recent Labs  03/23/15 1825 03/24/15 0510  WBC 14.3* 13.1*  NEUTROABS 12.1*  --   HGB 11.4* 10.5*  HCT 35.5* 33.6*  MCV 82.0 81.8  PLT 179 PLATELET CLUMPS NOTED ON SMEAR, UNABLE TO ESTIMATE    Assessment/Plan  Physical deconditioning Will have him work with physical therapy and occupational therapy team to help with gait training and muscle strengthening exercises.fall precautions. Skin care. Encourage to be out of bed.   Acute bronchitis Clinically improved, completed his zithromax course. continue Pulmicort 2 puffs into lungs twice a day  Atrial fibrillation Rate controlled, continue Toprol-XL 25 mg daily. High fall risk, thus not on anti-coagulation  Leukocytosis Was on steroid and had acute bronchitis which could have caused this. Recheck cbc with diff  Hypertension  Stable bp reading. continue Toprol-XL 25 mg daily  Anemia Monitor cbc  Dementia Stable. Continue Namenda 10 mg bid  Depression with dementia continue Effexor 37.5 mg bid. Continue klonopin 2 mg daily  Insomnia continue melatonin   Gout continue allopurinol 100 mg bid  BPH continue Proscar 5 mg daily   Goals of care: short term rehabilitation   Labs/tests ordered: cbc with diff, chest xray  Family/ staff Communication: reviewed care plan with patient and nursing supervisor    Blanchie Serve, MD  [facility] [phone]  (Monday-Friday 8 am - 5 pm) [phone] (afterhours)

## 2015-04-12 ENCOUNTER — Non-Acute Institutional Stay: Payer: Commercial Managed Care - HMO | Admitting: Adult Health

## 2015-04-12 ENCOUNTER — Encounter: Payer: Self-pay | Admitting: Adult Health

## 2015-04-12 DIAGNOSIS — F32A Depression, unspecified: Secondary | ICD-10-CM

## 2015-04-12 DIAGNOSIS — R5381 Other malaise: Secondary | ICD-10-CM | POA: Diagnosis not present

## 2015-04-12 DIAGNOSIS — I2510 Atherosclerotic heart disease of native coronary artery without angina pectoris: Secondary | ICD-10-CM

## 2015-04-12 DIAGNOSIS — F419 Anxiety disorder, unspecified: Secondary | ICD-10-CM

## 2015-04-12 DIAGNOSIS — I481 Persistent atrial fibrillation: Secondary | ICD-10-CM | POA: Diagnosis not present

## 2015-04-12 DIAGNOSIS — F0390 Unspecified dementia without behavioral disturbance: Secondary | ICD-10-CM

## 2015-04-12 DIAGNOSIS — G4700 Insomnia, unspecified: Secondary | ICD-10-CM

## 2015-04-12 DIAGNOSIS — R32 Unspecified urinary incontinence: Secondary | ICD-10-CM

## 2015-04-12 DIAGNOSIS — M1A9XX0 Chronic gout, unspecified, without tophus (tophi): Secondary | ICD-10-CM

## 2015-04-12 DIAGNOSIS — J42 Unspecified chronic bronchitis: Secondary | ICD-10-CM | POA: Diagnosis not present

## 2015-04-12 DIAGNOSIS — I4819 Other persistent atrial fibrillation: Secondary | ICD-10-CM

## 2015-04-12 DIAGNOSIS — I10 Essential (primary) hypertension: Secondary | ICD-10-CM

## 2015-04-12 DIAGNOSIS — F329 Major depressive disorder, single episode, unspecified: Secondary | ICD-10-CM | POA: Diagnosis not present

## 2015-04-12 DIAGNOSIS — N400 Enlarged prostate without lower urinary tract symptoms: Secondary | ICD-10-CM | POA: Diagnosis not present

## 2015-04-12 NOTE — Progress Notes (Signed)
Patient ID: Logan Allen, male   DOB: 01/31/1924, 81 y.o.   MRN: [id number]   04/12/2015  Facility:  Nursing Home Location:  [place] Room Number: 105-P LEVEL OF CARE:  SNF (31)  Chief Complaint  Patient presents with  . Discharge Note    Physical deconditioning, COPD, hypertension, gout, BPH, dementia, depression, CAD, atrial fibrillation, insomnia and anxiety    HISTORY OF PRESENT ILLNESS:  This is an 81 year old male who is for discharge home with Home health PT, OT and CNA. DME:  Bedside commode.  He has been admitted to [facility] on 03/25/15 from [facility]. He has PMH of CAD, hypertension, hyperlipidemia, COPD, exertional dyspnea, prostate cancer, hypothyroidism, gout and urinary incontinence. He presented to the ED with cough, SOB, low-grade fever and poor appetite. Family brought to patient to PCP and was given "a shot of antibiotic." Chest x-ray in the ED showed COPD, no infiltrate and elevated WBC. He was treated for Acute bronchitis and discharged with Azithromycin.   Patient was admitted to this facility for short-term rehabilitation after the patient's recent hospitalization.  Patient has completed SNF rehabilitation and therapy has cleared the patient for discharge.   PAST MEDICAL HISTORY:  Past Medical History  Diagnosis Date  . Coronary artery disease   . Hypertension   . High cholesterol   . COPD (chronic obstructive pulmonary disease)   . Exertional dyspnea 08/20/2012  . History of blood transfusion 1994; 2004    "w/both bypass ORs"  . History of carotid endarterectomy 1994    right  . Recurrent falls     "last fall 08/15/2012" (08/20/2012)  . Arthritis     "I suffer all over my body; dr said it could be from arthritis" (08/20/2012)  . Chronic lower back pain   . Anxiety   . Mental disorder   . Prostate cancer 1988    radiation treatment  . Hypothyroidism   . OSA (obstructive sleep apnea)   . Gout     "thumb"  (08/20/2012)  . Incontinence of urine     CURRENT MEDICATIONS: Reviewed per MAR/see medication list  Allergies  Allergen Reactions  . Percocet [Oxycodone-Acetaminophen] Other (See Comments)    delusional  . Zocor [Simvastatin]     Unknown   . Penicillins Rash     REVIEW OF SYSTEMS:  GENERAL: no change in appetite, no fatigue, no weight changes, no fever, chills or weakness RESPIRATORY: no cough, SOB, DOE, wheezing, hemoptysis CARDIAC: no chest pain, edema or palpitations GI: no abdominal pain, diarrhea, constipation, heart burn, nausea or vomiting  PHYSICAL EXAMINATION  GENERAL: no acute distress, normal body habitus NECK: supple, trachea midline, no neck masses, no thyroid tenderness, no thyromegaly LYMPHATICS: no LAN in the neck, no supraclavicular LAN RESPIRATORY: breathing is even & unlabored, BS CTAB CARDIAC: irregular heart rate, no murmur,no extra heart sounds, no edema GI: abdomen soft, normal BS, no masses, no tenderness, no hepatomegaly, no splenomegaly EXTREMITIES: Able to move 4 extremities PSYCHIATRIC: the patient is alert & oriented to person, affect & behavior appropriate  LABS/RADIOLOGY: Labs reviewed: 04/01/15  WBC 7.2 hemoglobin 12.2 hematocrit 36.8 MCV to 79.7 sodium 141 potassium 4.6 glucose 95 BUN 15 creatinine 1.08 total bilirubin 0.4 alkaline phosphatase 90 SGOT 19 SGPT 13 total protein 6.1 all be a mean 3.3 calcium 9.2 Basic Metabolic Panel:  Recent Labs  03/23/15 1825 03/24/15 0510 03/25/15 0547  NA 138 140 140  K 4.0 3.4* 4.5  CL  104 104 107  CO2 24 23 26   GLUCOSE 128* 178* 123*  BUN 16 19 26*  CREATININE 1.07 1.10 1.21  CALCIUM 8.6* 8.8* 8.7*  MG  --  2.0 2.2  PHOS  --  3.0  --    Liver Function Tests:  Recent Labs  03/24/15 0510  AST 20  ALT 17  ALKPHOS 81  BILITOT 0.2*  PROT 6.5  ALBUMIN 3.2*   CBC:  Recent Labs  03/23/15 1825 03/24/15 0510  WBC 14.3* 13.1*  NEUTROABS 12.1*  --   HGB 11.4* 10.5*  HCT 35.5* 33.6*    MCV 82.0 81.8  PLT 179 PLATELET CLUMPS NOTED ON SMEAR, UNABLE TO ESTIMATE    Dg Chest 2 View  03/23/2015   CLINICAL DATA:  Two week history of generalized weakness, cough, and shortness of breath. Four day history of nausea and vomiting. Current history of COPD. Prior CABG.  EXAM: CHEST  2 VIEW  COMPARISON:  08/20/2012 and earlier.  FINDINGS: AP erect and lateral imaging was performed. Suboptimal inspiration. Sternotomy for CABG. Cardiac silhouette normal in size, unchanged. Thoracic aorta tortuous and atherosclerotic, unchanged. Hilar and mediastinal contours otherwise unremarkable. Prominent bronchovascular markings diffusely and mild central peribronchial thickening, similar prior. Lungs otherwise clear. No localized airspace consolidation. No pleural effusions. No pneumothorax. Normal pulmonary vascularity. Osteoporotic compression fracture of what I believe is T10, unchanged. Compression fracture of what I believe is L2 with prior augmentation.  IMPRESSION: Suboptimal inspiration. Stable chronic bronchitis and/or asthma. No acute cardiopulmonary disease.   Electronically Signed   By: Evangeline Dakin M.D.   On: 03/23/2015 18:57   Ct Head Wo Contrast  03/24/2015   CLINICAL DATA:  Recent falls. Failure to thrive. History of prostate cancer.  EXAM: CT HEAD WITHOUT CONTRAST  TECHNIQUE: Contiguous axial images were obtained from the base of the skull through the vertex without intravenous contrast.  COMPARISON:  05/23/2014; 10/04/2012  FINDINGS: Similar findings of advanced atrophy with diffuse sulcal prominence and centralized volume loss with commensurate ex vacuo dilatation of the ventricular system. Extensive nearly confluent periventricular hypodensities compatible microvascular ischemic disease. Bilateral basal ganglial calcifications. Given extensive background parenchymal abnormalities, there is no CT evidence of superimposed acute large territory infarct. No intraparenchymal or extra-axial mass or  hemorrhage. Unchanged size and configuration of the ventricles and basilar cisterns. No midline shift. Intracranial atherosclerosis. Limited visualization the paranasal sinuses and mastoid air cells is normal. No air-fluid levels. Regional soft tissues appear normal. Post bilateral cataract surgery. No displaced calvarial fracture.  IMPRESSION: Similar findings of advanced atrophy and microvascular ischemic disease without acute intracranial process.   Electronically Signed   By: Sandi Mariscal M.D.   On: 03/24/2015 01:11    ASSESSMENT/PLAN:  Physical deconditioning - for Home health PT, OT and CNA COPD - continue Mucinex 600 mg by mouth twice a day and Pulmicort 2 puffs into lungs twice a day Hypertension - well controlled; continue Toprol-XL 25 mg by mouth daily Gout - continue allopurinol 100 mg by mouth twice a day BPH - continue Proscar 5 mg by mouth every morning Dementia - Namenda 10 mg by mouth twice a day Depression - continue Effexor 37.5 mg by mouth twice a day CAD - stable; continue Pletal 50 mg by mouth twice a day, Toprol-XL 25 mg by mouth daily and aspirin 81 mg by mouth daily Atrial fibrillation - rate controlled; not a candidate for anticoagulation due to high risk for falls; continue Toprol-XL 25 mg by mouth daily Anxiety -  mood is stable; continue Klonopin 2 mg by mouth daily at bedtime Insomnia - continue melatonin 300 g by mouth daily at bedtime Urinary incontinence - continue oxybutynin 5 mg 1 tab by mouth daily at bedtime    I have filled out patient's discharge paperwork and written prescriptions.  Patient will receive home health PT, OT and CNA.  DME provided:  Bedside commode  Total discharge time: Greater than 30 minutes  Discharge time involved coordination of the discharge process with social worker, nursing staff and therapy department. Medical justification for home health services/DME verified.     [facility], NP [employer] [phone]

## 2015-04-18 DIAGNOSIS — J449 Chronic obstructive pulmonary disease, unspecified: Secondary | ICD-10-CM | POA: Diagnosis not present

## 2015-04-18 DIAGNOSIS — I10 Essential (primary) hypertension: Secondary | ICD-10-CM | POA: Diagnosis not present

## 2015-04-18 DIAGNOSIS — I4891 Unspecified atrial fibrillation: Secondary | ICD-10-CM | POA: Diagnosis not present

## 2015-04-18 DIAGNOSIS — I2510 Atherosclerotic heart disease of native coronary artery without angina pectoris: Secondary | ICD-10-CM | POA: Diagnosis not present

## 2015-09-17 ENCOUNTER — Encounter (HOSPITAL_COMMUNITY): Payer: Self-pay | Admitting: *Deleted

## 2015-09-17 ENCOUNTER — Emergency Department (HOSPITAL_COMMUNITY)
Admission: EM | Admit: 2015-09-17 | Discharge: 2015-09-17 | Disposition: A | Discharge status: Home / Self Care | Payer: Commercial Managed Care - HMO | Attending: Emergency Medicine | Admitting: Emergency Medicine

## 2015-09-17 ENCOUNTER — Emergency Department (HOSPITAL_COMMUNITY): Payer: Commercial Managed Care - HMO

## 2015-09-17 DIAGNOSIS — M1990 Unspecified osteoarthritis, unspecified site: Secondary | ICD-10-CM | POA: Diagnosis not present

## 2015-09-17 DIAGNOSIS — Y9389 Activity, other specified: Secondary | ICD-10-CM | POA: Insufficient documentation

## 2015-09-17 DIAGNOSIS — W01198A Fall on same level from slipping, tripping and stumbling with subsequent striking against other object, initial encounter: Secondary | ICD-10-CM | POA: Diagnosis not present

## 2015-09-17 DIAGNOSIS — F0390 Unspecified dementia without behavioral disturbance: Secondary | ICD-10-CM | POA: Insufficient documentation

## 2015-09-17 DIAGNOSIS — Z87891 Personal history of nicotine dependence: Secondary | ICD-10-CM | POA: Diagnosis not present

## 2015-09-17 DIAGNOSIS — Z880 Allergy status to penicillin: Secondary | ICD-10-CM | POA: Diagnosis not present

## 2015-09-17 DIAGNOSIS — Z79899 Other long term (current) drug therapy: Secondary | ICD-10-CM | POA: Insufficient documentation

## 2015-09-17 DIAGNOSIS — Z951 Presence of aortocoronary bypass graft: Secondary | ICD-10-CM | POA: Diagnosis not present

## 2015-09-17 DIAGNOSIS — Y92121 Bathroom in nursing home as the place of occurrence of the external cause: Secondary | ICD-10-CM | POA: Insufficient documentation

## 2015-09-17 DIAGNOSIS — M109 Gout, unspecified: Secondary | ICD-10-CM | POA: Diagnosis not present

## 2015-09-17 DIAGNOSIS — Y998 Other external cause status: Secondary | ICD-10-CM | POA: Insufficient documentation

## 2015-09-17 DIAGNOSIS — J449 Chronic obstructive pulmonary disease, unspecified: Secondary | ICD-10-CM | POA: Insufficient documentation

## 2015-09-17 DIAGNOSIS — W19XXXA Unspecified fall, initial encounter: Secondary | ICD-10-CM

## 2015-09-17 DIAGNOSIS — I2510 Atherosclerotic heart disease of native coronary artery without angina pectoris: Secondary | ICD-10-CM | POA: Insufficient documentation

## 2015-09-17 DIAGNOSIS — S0990XA Unspecified injury of head, initial encounter: Secondary | ICD-10-CM | POA: Diagnosis present

## 2015-09-17 DIAGNOSIS — Z9889 Other specified postprocedural states: Secondary | ICD-10-CM | POA: Insufficient documentation

## 2015-09-17 DIAGNOSIS — I10 Essential (primary) hypertension: Secondary | ICD-10-CM | POA: Diagnosis not present

## 2015-09-17 DIAGNOSIS — G8929 Other chronic pain: Secondary | ICD-10-CM | POA: Insufficient documentation

## 2015-09-17 DIAGNOSIS — F419 Anxiety disorder, unspecified: Secondary | ICD-10-CM | POA: Diagnosis not present

## 2015-09-17 DIAGNOSIS — Z8546 Personal history of malignant neoplasm of prostate: Secondary | ICD-10-CM | POA: Insufficient documentation

## 2015-09-17 DIAGNOSIS — Z7982 Long term (current) use of aspirin: Secondary | ICD-10-CM | POA: Insufficient documentation

## 2015-09-17 DIAGNOSIS — R519 Headache, unspecified: Secondary | ICD-10-CM

## 2015-09-17 DIAGNOSIS — Z8639 Personal history of other endocrine, nutritional and metabolic disease: Secondary | ICD-10-CM | POA: Insufficient documentation

## 2015-09-17 DIAGNOSIS — R51 Headache: Secondary | ICD-10-CM

## 2015-09-17 LAB — URINALYSIS, ROUTINE W REFLEX MICROSCOPIC
GLUCOSE, UA: NEGATIVE mg/dL
Hgb urine dipstick: NEGATIVE
Ketones, ur: NEGATIVE mg/dL
Leukocytes, UA: NEGATIVE
NITRITE: NEGATIVE
Protein, ur: NEGATIVE mg/dL
Specific Gravity, Urine: 1.023 (ref 1.005–1.030)
pH: 5 (ref 5.0–8.0)

## 2015-09-17 NOTE — ED Notes (Signed)
Bed: WA09 Expected date: 09/17/15 Expected time: 4:56 PM Means of arrival: Ambulance Comments: Fall

## 2015-09-17 NOTE — ED Notes (Addendum)
Patient states he fell from a standing position earlier today.  Patient states he hit his head, but denies denies LOC.  Patient denies dizziness, N/V/D and changes in vision.

## 2015-09-17 NOTE — ED Notes (Signed)
[facility] was called for pt's transportation back to [place]. 

## 2015-09-17 NOTE — ED Notes (Signed)
Pt from [facility], per ems pt had unwitnessed fall trying to get up from chair , pt presents with bruise to posterior head, right elbow skin tear. Pt alert and oriented x 4 although Hx dementia. Pt presents with rolled towels around neck , per ems pt was unable to tolerate c collar. Per family pt has been weakness to lower extremities.  since Sunday yet pt denies weakness per ems.

## 2015-09-17 NOTE — ED Notes (Signed)
[facility] here to transport pt back to [facility].

## 2015-09-17 NOTE — ED Provider Notes (Signed)
CSN: [id number]     Arrival date & time 09/17/15  1659 History   First MD Initiated Contact with Patient 09/17/15 1720     Chief Complaint  Patient presents with  . Fall     (Consider location/radiation/quality/duration/timing/severity/associated sxs/prior Treatment) HPI   Logan Allen is a 81 y.o. male, with a history of COPD, CAD, dementia, and recurrent falls, presenting to the ED for evaluation following a fall at [facility], also known as [facility] on the assisted living side. Pt is oriented x4 and states he fell in the bathroom due to loss of balance. Pt fell backwards and hit the back of his head on the floor. Pt also had a fall on Wednesday. Pt daughter, Gwenlyn Saran, is at the bedside and states pt has been weaker the last three days and wonders if pt might have a UTI. Pt currently denies any pain. Pt denies dizziness, LOC, shortness of breath, chest pain, N/V, or any other complaints.     Past Medical History  Diagnosis Date  . Coronary artery disease   . Hypertension   . High cholesterol   . COPD (chronic obstructive pulmonary disease) ([place])   . Exertional dyspnea 08/20/2012  . History of blood transfusion 1994; 2004    "w/both bypass ORs"  . History of carotid endarterectomy 1994    right  . Recurrent falls     "last fall 08/15/2012" (08/20/2012)  . Arthritis     "I suffer all over my body; dr said it could be from arthritis" (08/20/2012)  . Chronic lower back pain   . Anxiety   . Mental disorder   . Prostate cancer [facility]) 1988    radiation treatment  . Hypothyroidism   . OSA (obstructive sleep apnea)   . Gout     "thumb" (08/20/2012)  . Incontinence of urine    Past Surgical History  Procedure Laterality Date  . Appendectomy    . Cataract extraction w/ intraocular lens  implant, bilateral    . Coronary artery bypass graft  1994; 2004  . Prostatectomy    . Hernia repair      UHR  . Cardiac valve replacement  2004    AVR  .  Orchiectomy  2001    bilateral  . Penile prosthesis  removal  2001  . Circumcision  2001   Family History  Problem Relation Age of Onset  . Hypertension Father   . Depression Father    Social History  Substance Use Topics  . Smoking status: Former Smoker -- 2.50 packs/day for 55 years    Types: Cigarettes  . Smokeless tobacco: Never Used     Comment: 08/20/2012 "stopped smoking cigarettes ~ 1994"  . Alcohol Use: No    Review of Systems  HENT:       Mild pain to the back of the head.  Respiratory: Negative for shortness of breath.   Cardiovascular: Negative for chest pain.  All other systems reviewed and are negative.     Allergies  Percocet; Zocor; and Penicillins  Home Medications   Prior to Admission medications   Medication Sig Start Date End Date Taking? Authorizing Provider  allopurinol (ZYLOPRIM) 100 MG tablet Take 100 mg by mouth 2 (two) times daily.   Yes Historical Provider, MD  aspirin EC 81 MG tablet Take 81 mg by mouth daily.   Yes Historical Provider, MD  bimatoprost (LUMIGAN) 0.01 % SOLN Place 1 drop into both eyes at bedtime.  Yes Historical Provider, MD  cilostazol (PLETAL) 50 MG tablet Take 50 mg by mouth 2 (two) times daily.   Yes Historical Provider, MD  clonazePAM (KLONOPIN) 2 MG tablet Take 2 mg by mouth at bedtime.   Yes Historical Provider, MD  finasteride (PROSCAR) 5 MG tablet Take 5 mg by mouth every morning.  02/04/15  Yes Historical Provider, MD  memantine (NAMENDA) 10 MG tablet Take 10 mg by mouth 2 (two) times daily.   Yes Historical Provider, MD  metoprolol succinate (TOPROL-XL) 25 MG 24 hr tablet Take 25 mg by mouth daily.   Yes Historical Provider, MD  tobramycin-dexamethasone [facility]) ophthalmic solution Place 1 drop into both eyes 3 (three) times daily.   Yes Historical Provider, MD  venlafaxine (EFFEXOR) 37.5 MG tablet Take 37.5 mg by mouth daily.    Yes Historical Provider, MD   BP 156/84 mmHg  Pulse 94  Temp(Src) 97.5 F (36.4 C)  (Oral)  Resp 18  SpO2 95% Physical Exam  Constitutional: He is oriented to person, place, and time. He appears well-developed and well-nourished. No distress.  HENT:  Head: Normocephalic and atraumatic.  Mild tenderness to the occipital region of pt head with no visible wounds or swelling.   Eyes: Conjunctivae and EOM are normal. Pupils are equal, round, and reactive to light.  Neck:  Neck exam deferred until after CT scan.  Cardiovascular: Normal rate, regular rhythm, normal heart sounds and intact distal pulses.   Pulmonary/Chest: Effort normal and breath sounds normal. No respiratory distress.  Abdominal: Soft. Bowel sounds are normal.  Musculoskeletal: Normal range of motion. He exhibits no edema or tenderness.  Neurological: He is alert and oriented to person, place, and time. He has normal reflexes.  Decreased sensation in patient's right great toe, but pt states this is chronic for him. No additional sensory deficits. Strength 5/5 in all extremities. No gait disturbance. Cranial nerves II-XII grossly intact.  Skin: Skin is warm and dry. He is not diaphoretic.  Nursing note and vitals reviewed.   ED Course  Procedures (including critical care time) Labs Review Labs Reviewed  URINALYSIS, ROUTINE W REFLEX MICROSCOPIC (NOT AT [facility]) - Abnormal; Notable for the following:    Bilirubin Urine SMALL (*)    All other components within normal limits    Imaging Review Ct Head Wo Contrast  09/17/2015  CLINICAL DATA:  Fall.  Confusion and dementia. EXAM: CT HEAD WITHOUT CONTRAST CT CERVICAL SPINE WITHOUT CONTRAST TECHNIQUE: Multidetector CT imaging of the head and cervical spine was performed following the standard protocol without intravenous contrast. Multiplanar CT image reconstructions of the cervical spine were also generated. COMPARISON:  03/24/2015 FINDINGS: CT HEAD FINDINGS Prominence of the sulci and ventricles identified consistent with brain atrophy. There is diffuse low  attenuation throughout the subcortical and periventricular white matter compatible with chronic microvascular disease. The paranasal sinuses and mastoid air cells appear clear. The calvarium is intact. CT CERVICAL SPINE FINDINGS The alignment of the cervical spine is within normal limits. There is multi level disc space narrowing and ventral endplate spurring compatible with degenerative disc disease. Most advanced at C6-7. The facet joints appear well-aligned. No fracture or subluxation identified. No fracture or subluxation identified. Carotid artery calcifications noted. The visualized lung apices are clear. IMPRESSION: 1. No acute intracranial abnormalities. 2. Chronic microvascular disease and brain atrophy. 3. No evidence for cervical spine fracture. 4. Cervical spondylosis. Electronically Signed   By: Kerby Moors M.D.   On: 09/17/2015 18:18   Ct Cervical  Spine Wo Contrast  09/17/2015  CLINICAL DATA:  Fall.  Confusion and dementia. EXAM: CT HEAD WITHOUT CONTRAST CT CERVICAL SPINE WITHOUT CONTRAST TECHNIQUE: Multidetector CT imaging of the head and cervical spine was performed following the standard protocol without intravenous contrast. Multiplanar CT image reconstructions of the cervical spine were also generated. COMPARISON:  03/24/2015 FINDINGS: CT HEAD FINDINGS Prominence of the sulci and ventricles identified consistent with brain atrophy. There is diffuse low attenuation throughout the subcortical and periventricular white matter compatible with chronic microvascular disease. The paranasal sinuses and mastoid air cells appear clear. The calvarium is intact. CT CERVICAL SPINE FINDINGS The alignment of the cervical spine is within normal limits. There is multi level disc space narrowing and ventral endplate spurring compatible with degenerative disc disease. Most advanced at C6-7. The facet joints appear well-aligned. No fracture or subluxation identified. No fracture or subluxation identified.  Carotid artery calcifications noted. The visualized lung apices are clear. IMPRESSION: 1. No acute intracranial abnormalities. 2. Chronic microvascular disease and brain atrophy. 3. No evidence for cervical spine fracture. 4. Cervical spondylosis. Electronically Signed   By: Kerby Moors M.D.   On: 09/17/2015 18:18   I have personally reviewed and evaluated these images and lab results as part of my medical decision-making.   EKG Interpretation None      MDM   Final diagnoses:  Fall, initial encounter  Nonintractable headache, unspecified chronicity pattern, unspecified headache type    DERAY KIMBRO presents for evaluation following a fall. Pt currently has no current complaints.   Findings and plan of care discussed with Alfonzo Beers, MD.  5:23 PM Attempted to contact [facility] staff, but there was no answer and I left a message. Patient has no neurologic deficits, no signs of basilar skull fracture, no nausea or vomiting, and no signs of severe head injury. CT scan is free from any intracranial or cervical spine abnormalities. The CTs results were delivered to the patient and the patient's daughter at the bedside. Once we obtain a urine sample and the urinalysis results are delivered, patient will be discharged back to [facility]. Urinalysis reveals no signs of UTI. Patient will be discharged. The results of the urinalysis were delivered to the patient and patient's daughter. Both parties are comfortable with discharge.  Lorayne Bender, PA-C 09/17/15 [place], MD 09/17/15 [phone]

## 2015-09-17 NOTE — Discharge Instructions (Signed)
You have been seen today for evaluation after a fall. Your imaging and lab tests showed no abnormalities. Follow up with PCP as needed. Return to ED should symptoms worsen.

## 2015-09-22 ENCOUNTER — Encounter (HOSPITAL_COMMUNITY): Payer: Self-pay | Admitting: *Deleted

## 2015-09-22 ENCOUNTER — Emergency Department (HOSPITAL_COMMUNITY): Payer: Commercial Managed Care - HMO

## 2015-09-22 ENCOUNTER — Emergency Department (HOSPITAL_COMMUNITY)
Admission: EM | Admit: 2015-09-22 | Discharge: 2015-09-22 | Disposition: A | Discharge status: Home / Self Care | Payer: Commercial Managed Care - HMO | Attending: Emergency Medicine | Admitting: Emergency Medicine

## 2015-09-22 DIAGNOSIS — I10 Essential (primary) hypertension: Secondary | ICD-10-CM | POA: Diagnosis not present

## 2015-09-22 DIAGNOSIS — Z951 Presence of aortocoronary bypass graft: Secondary | ICD-10-CM | POA: Insufficient documentation

## 2015-09-22 DIAGNOSIS — Y9289 Other specified places as the place of occurrence of the external cause: Secondary | ICD-10-CM | POA: Insufficient documentation

## 2015-09-22 DIAGNOSIS — I2510 Atherosclerotic heart disease of native coronary artery without angina pectoris: Secondary | ICD-10-CM | POA: Diagnosis not present

## 2015-09-22 DIAGNOSIS — Z87891 Personal history of nicotine dependence: Secondary | ICD-10-CM | POA: Diagnosis not present

## 2015-09-22 DIAGNOSIS — F419 Anxiety disorder, unspecified: Secondary | ICD-10-CM | POA: Diagnosis not present

## 2015-09-22 DIAGNOSIS — Z8639 Personal history of other endocrine, nutritional and metabolic disease: Secondary | ICD-10-CM | POA: Diagnosis not present

## 2015-09-22 DIAGNOSIS — Z79899 Other long term (current) drug therapy: Secondary | ICD-10-CM | POA: Diagnosis not present

## 2015-09-22 DIAGNOSIS — M109 Gout, unspecified: Secondary | ICD-10-CM | POA: Insufficient documentation

## 2015-09-22 DIAGNOSIS — Y998 Other external cause status: Secondary | ICD-10-CM | POA: Insufficient documentation

## 2015-09-22 DIAGNOSIS — S8991XA Unspecified injury of right lower leg, initial encounter: Secondary | ICD-10-CM | POA: Insufficient documentation

## 2015-09-22 DIAGNOSIS — Z7982 Long term (current) use of aspirin: Secondary | ICD-10-CM | POA: Diagnosis not present

## 2015-09-22 DIAGNOSIS — J449 Chronic obstructive pulmonary disease, unspecified: Secondary | ICD-10-CM | POA: Insufficient documentation

## 2015-09-22 DIAGNOSIS — M25561 Pain in right knee: Secondary | ICD-10-CM

## 2015-09-22 DIAGNOSIS — Y9389 Activity, other specified: Secondary | ICD-10-CM | POA: Insufficient documentation

## 2015-09-22 DIAGNOSIS — Z880 Allergy status to penicillin: Secondary | ICD-10-CM | POA: Diagnosis not present

## 2015-09-22 DIAGNOSIS — S79911A Unspecified injury of right hip, initial encounter: Secondary | ICD-10-CM | POA: Diagnosis not present

## 2015-09-22 DIAGNOSIS — W1811XA Fall from or off toilet without subsequent striking against object, initial encounter: Secondary | ICD-10-CM | POA: Insufficient documentation

## 2015-09-22 DIAGNOSIS — G8929 Other chronic pain: Secondary | ICD-10-CM | POA: Diagnosis not present

## 2015-09-22 DIAGNOSIS — Z9889 Other specified postprocedural states: Secondary | ICD-10-CM | POA: Insufficient documentation

## 2015-09-22 DIAGNOSIS — M25551 Pain in right hip: Secondary | ICD-10-CM

## 2015-09-22 DIAGNOSIS — Z8546 Personal history of malignant neoplasm of prostate: Secondary | ICD-10-CM | POA: Diagnosis not present

## 2015-09-22 DIAGNOSIS — W19XXXA Unspecified fall, initial encounter: Secondary | ICD-10-CM

## 2015-09-22 DIAGNOSIS — M1990 Unspecified osteoarthritis, unspecified site: Secondary | ICD-10-CM | POA: Insufficient documentation

## 2015-09-22 NOTE — Discharge Instructions (Signed)

## 2015-09-22 NOTE — ED Provider Notes (Signed)
CSN: [id number]     Arrival date & time 09/22/15  0440 History   First MD Initiated Contact with Patient 09/22/15 [id number]     Chief Complaint  Patient presents with  . Fall  . Hip Pain     (Consider location/radiation/quality/duration/timing/severity/associated sxs/prior Treatment) HPI Comments: Patient is a 81 year old male with a history of coronary artery disease, hypertension, COPD, recurrent falls, and chronic low back pain. He presents after a fall this evening. He reports that he was sitting on the commode when it became unsteady causing him to fall on his right side and hip. Patient is complaining of right hip and knee pain since the fall. He has not ambulated sense. He denies any LOC. No vision changes, nausea, vomiting, or extremity numbness. No recent fevers. Patient does not make any complaints of lightheadedness or dizziness preceding his fall. He was transported to the emergency department by EMS from [facility].  Patient is a 81 y.o. male presenting with fall and hip pain. The history is provided by the patient. No language interpreter was used.  Fall Associated symptoms include arthralgias and myalgias.  Hip Pain Associated symptoms include arthralgias and myalgias.    Past Medical History  Diagnosis Date  . Coronary artery disease   . Hypertension   . High cholesterol   . COPD (chronic obstructive pulmonary disease) ([place])   . Exertional dyspnea 08/20/2012  . History of blood transfusion 1994; 2004    "w/both bypass ORs"  . History of carotid endarterectomy 1994    right  . Recurrent falls     "last fall 08/15/2012" (08/20/2012)  . Arthritis     "I suffer all over my body; dr said it could be from arthritis" (08/20/2012)  . Chronic lower back pain   . Anxiety   . Mental disorder   . Prostate cancer [facility]) 1988    radiation treatment  . Hypothyroidism   . OSA (obstructive sleep apnea)   . Gout     "thumb" (08/20/2012)  . Incontinence of urine     Past Surgical History  Procedure Laterality Date  . Appendectomy    . Cataract extraction w/ intraocular lens  implant, bilateral    . Coronary artery bypass graft  1994; 2004  . Prostatectomy    . Hernia repair      UHR  . Cardiac valve replacement  2004    AVR  . Orchiectomy  2001    bilateral  . Penile prosthesis  removal  2001  . Circumcision  2001   Family History  Problem Relation Age of Onset  . Hypertension Father   . Depression Father    Social History  Substance Use Topics  . Smoking status: Former Smoker -- 2.50 packs/day for 55 years    Types: Cigarettes  . Smokeless tobacco: Never Used     Comment: 08/20/2012 "stopped smoking cigarettes ~ 1994"  . Alcohol Use: No    Review of Systems  Musculoskeletal: Positive for myalgias and arthralgias.  All other systems reviewed and are negative.   Allergies  Percocet; Zocor; and Penicillins  Home Medications   Prior to Admission medications   Medication Sig Start Date End Date Taking? Authorizing Provider  aspirin EC 81 MG tablet Take 81 mg by mouth daily.   Yes Historical Provider, MD  bimatoprost (LUMIGAN) 0.01 % SOLN Place 1 drop into both eyes at bedtime.   Yes Historical Provider, MD  cilostazol (PLETAL) 50 MG tablet Take 50 mg by  mouth 2 (two) times daily.   Yes Historical Provider, MD  clonazePAM (KLONOPIN) 2 MG tablet Take 2 mg by mouth at bedtime.   Yes Historical Provider, MD  finasteride (PROSCAR) 5 MG tablet Take 5 mg by mouth every morning.  02/04/15  Yes Historical Provider, MD  Melatonin 5 MG TABS Take 1 tablet by mouth at bedtime.   Yes Historical Provider, MD  memantine (NAMENDA) 10 MG tablet Take 10 mg by mouth 2 (two) times daily.   Yes Historical Provider, MD  metoprolol succinate (TOPROL-XL) 25 MG 24 hr tablet Take 25 mg by mouth daily.   Yes Historical Provider, MD  polyvinyl alcohol (LIQUIFILM TEARS) 1.4 % ophthalmic solution Place 1 drop into both eyes 2 (two) times daily. For 6 days   Yes  Historical Provider, MD  tobramycin-dexamethasone [facility]) ophthalmic solution Place 1 drop into both eyes 3 (three) times daily.   Yes Historical Provider, MD  venlafaxine (EFFEXOR) 37.5 MG tablet Take 37.5 mg by mouth daily.    Yes Historical Provider, MD  allopurinol (ZYLOPRIM) 100 MG tablet Take 100 mg by mouth daily.     Historical Provider, MD   BP 159/81 mmHg  Pulse 86  Temp(Src) 97.5 F (36.4 C) (Oral)  Resp 18  SpO2 95%   Physical Exam  Constitutional: He is oriented to person, place, and time. He appears well-developed and well-nourished. No distress.  HENT:  Head: Normocephalic and atraumatic.  No battle's sign or raccoon's eyes. No skull instability.  Eyes: Conjunctivae and EOM are normal. No scleral icterus.  Neck: Normal range of motion.  Cardiovascular: Normal rate, regular rhythm and intact distal pulses.   DP and PT pulses 2+ b/l  Pulmonary/Chest: Effort normal. No respiratory distress.  Respirations even and unlabored  Musculoskeletal: Normal range of motion.       Right hip: He exhibits tenderness. He exhibits normal range of motion, no swelling, no crepitus and no deformity.       Right knee: He exhibits swelling (mild, medial knee). He exhibits normal range of motion, no deformity, no LCL laxity and no MCL laxity. Tenderness found.       Right upper leg: Normal.       Right lower leg: Normal.       Legs: No leg shortening or malrotation of the RLE  Neurological: He is alert and oriented to person, place, and time. He exhibits normal muscle tone. Coordination normal.  Sensation to light touch intact in bilateral lower extremities. Patient able to wiggle all toes. GCS 15. Speech is goal oriented. Patient answers questions appropriately and follows simple commands.  Skin: Skin is warm and dry. No rash noted. He is not diaphoretic. No erythema. No pallor.  Psychiatric: He has a normal mood and affect. His behavior is normal.  Nursing note and vitals  reviewed.   ED Course  Procedures (including critical care time) Labs Review Labs Reviewed - No data to display  Imaging Review Dg Hip Unilat  With Pelvis 2-3 Views Right  09/22/2015  CLINICAL DATA:  Status post fall, with moderate right hip pain. Initial encounter. EXAM: DG HIP (WITH OR WITHOUT PELVIS) 2-3V RIGHT COMPARISON:  None. FINDINGS: There is no evidence of fracture or dislocation. Both femoral heads are seated normally within their respective acetabula. The proximal right femur appears intact. Mild degenerative change is noted at the lower lumbar spine. The sacroiliac joints are unremarkable in appearance. An aortoiliac stent graft is noted. Scattered postoperative change is seen about the  pelvis. The visualized bowel gas pattern is grossly unremarkable in appearance. IMPRESSION: No evidence of fracture or dislocation. Electronically Signed   By: Garald Balding M.D.   On: 09/22/2015 05:38   I have personally reviewed and evaluated these images and lab results as part of my medical decision-making.   EKG Interpretation None      MDM   Final diagnoses:  Right hip pain  Right knee pain  Fall, initial encounter    81 year old male presents to the emergency department after a fall at a senior living facility. No head trauma or loss of consciousness. Patient complaining of right hip and knee pain. If x-ray shows no evidence of fracture. X-ray of knee is pending. If knee x-ray is negative, plan to attempt ambulation. Patient is able to ambulate, given that he is neurovascularly intact with normal range of motion of his right lower extremity, I believe he is stable for discharge back to his facility. If he is unable to bear weight on his right leg, CT may be warranted. Patient signed out to Delos Haring, PA-C at change of shift who will follow-up on imaging and disposition appropriately.   Filed Vitals:   09/22/15 0443  BP: 159/81  Pulse: 86  Temp: 97.5 F (36.4 C)  TempSrc:  Oral  Resp: 18  SpO2: 95%     Antonietta Breach, PA-C 09/22/15 [id number]  April Palumbo, MD 09/22/15 [phone]

## 2015-09-22 NOTE — ED Notes (Signed)
Per [facility] - pt from [facility] - pt was attempted to use the bathroom when his elevated toilet seat became unsteady causing him to fall, pt fell to onto right side, denies head injury or LOC. Pt c/o rt hip only at this time.

## 2015-09-22 NOTE — ED Provider Notes (Signed)
Patient to the ER after a fall from sitting on the commode after becoming unsteady. He fell onto his right side and hip.   He typically walks with a walker.   Results for orders placed or performed during the hospital encounter of 09/17/15  Urinalysis, Routine w reflex microscopic (not at [facility])  Result Value Ref Range   Color, Urine YELLOW YELLOW   APPearance CLEAR CLEAR   Specific Gravity, Urine 1.023 1.005 - 1.030   pH 5.0 5.0 - 8.0   Glucose, UA NEGATIVE NEGATIVE mg/dL   Hgb urine dipstick NEGATIVE NEGATIVE   Bilirubin Urine SMALL (A) NEGATIVE   Ketones, ur NEGATIVE NEGATIVE mg/dL   Protein, ur NEGATIVE NEGATIVE mg/dL   Nitrite NEGATIVE NEGATIVE   Leukocytes, UA NEGATIVE NEGATIVE   Ct Head Wo Contrast  09/17/2015  CLINICAL DATA:  Fall.  Confusion and dementia. EXAM: CT HEAD WITHOUT CONTRAST CT CERVICAL SPINE WITHOUT CONTRAST TECHNIQUE: Multidetector CT imaging of the head and cervical spine was performed following the standard protocol without intravenous contrast. Multiplanar CT image reconstructions of the cervical spine were also generated. COMPARISON:  03/24/2015 FINDINGS: CT HEAD FINDINGS Prominence of the sulci and ventricles identified consistent with brain atrophy. There is diffuse low attenuation throughout the subcortical and periventricular white matter compatible with chronic microvascular disease. The paranasal sinuses and mastoid air cells appear clear. The calvarium is intact. CT CERVICAL SPINE FINDINGS The alignment of the cervical spine is within normal limits. There is multi level disc space narrowing and ventral endplate spurring compatible with degenerative disc disease. Most advanced at C6-7. The facet joints appear well-aligned. No fracture or subluxation identified. No fracture or subluxation identified. Carotid artery calcifications noted. The visualized lung apices are clear. IMPRESSION: 1. No acute intracranial abnormalities. 2. Chronic microvascular disease and  brain atrophy. 3. No evidence for cervical spine fracture. 4. Cervical spondylosis. Electronically Signed   By: Kerby Moors M.D.   On: 09/17/2015 18:18   Ct Cervical Spine Wo Contrast  09/17/2015  CLINICAL DATA:  Fall.  Confusion and dementia. EXAM: CT HEAD WITHOUT CONTRAST CT CERVICAL SPINE WITHOUT CONTRAST TECHNIQUE: Multidetector CT imaging of the head and cervical spine was performed following the standard protocol without intravenous contrast. Multiplanar CT image reconstructions of the cervical spine were also generated. COMPARISON:  03/24/2015 FINDINGS: CT HEAD FINDINGS Prominence of the sulci and ventricles identified consistent with brain atrophy. There is diffuse low attenuation throughout the subcortical and periventricular white matter compatible with chronic microvascular disease. The paranasal sinuses and mastoid air cells appear clear. The calvarium is intact. CT CERVICAL SPINE FINDINGS The alignment of the cervical spine is within normal limits. There is multi level disc space narrowing and ventral endplate spurring compatible with degenerative disc disease. Most advanced at C6-7. The facet joints appear well-aligned. No fracture or subluxation identified. No fracture or subluxation identified. Carotid artery calcifications noted. The visualized lung apices are clear. IMPRESSION: 1. No acute intracranial abnormalities. 2. Chronic microvascular disease and brain atrophy. 3. No evidence for cervical spine fracture. 4. Cervical spondylosis. Electronically Signed   By: Kerby Moors M.D.   On: 09/17/2015 18:18   Dg Knee Complete 4 Views Right  09/22/2015  CLINICAL DATA:  Status post fall, with generalized right knee pain. Initial encounter. EXAM: RIGHT KNEE - COMPLETE 4+ VIEW COMPARISON:  Right tibia/fibula radiographs performed 08/20/2012 FINDINGS: There is no evidence of fracture or dislocation. The joint spaces are preserved. Mild cortical irregularity is noted along the articular surface  of the  patella. Mild osteophyte formation is noted at the tibial spine. No significant joint effusion is seen. Scattered vascular calcifications are seen. Scattered postoperative change is noted about the knee and proximal lower leg. IMPRESSION: 1. No evidence of fracture or dislocation. 2. Scattered vascular calcifications seen. Electronically Signed   By: Garald Balding M.D.   On: 09/22/2015 06:15   Dg Hip Unilat  With Pelvis 2-3 Views Right  09/22/2015  CLINICAL DATA:  Status post fall, with moderate right hip pain. Initial encounter. EXAM: DG HIP (WITH OR WITHOUT PELVIS) 2-3V RIGHT COMPARISON:  None. FINDINGS: There is no evidence of fracture or dislocation. Both femoral heads are seated normally within their respective acetabula. The proximal right femur appears intact. Mild degenerative change is noted at the lower lumbar spine. The sacroiliac joints are unremarkable in appearance. An aortoiliac stent graft is noted. Scattered postoperative change is seen about the pelvis. The visualized bowel gas pattern is grossly unremarkable in appearance. IMPRESSION: No evidence of fracture or dislocation. Electronically Signed   By: Garald Balding M.D.   On: 09/22/2015 05:38   I visualized the patient ambulating, the family member says his gait is per his baseline. The patient says his hip hurts to walk on it but he is able to walk.   Family member requests pt to be transferred back to [facility]. - Dr. Randal Buba aware pt going home.  Medications - No data to display  81 y.o.Logan Allen's medical screening exam was performed and I feel the patient has had an appropriate workup for their chief complaint at this time and likelihood of emergent condition existing is low. They have been counseled on decision, discharge, follow up and which symptoms necessitate immediate return to the emergency department. They or their family verbally stated understanding and agreement with plan and discharged in stable condition.    Vital signs are stable at discharge. Filed Vitals:   09/22/15 0443  BP: 159/81  Pulse: 86  Temp: 97.5 F (36.4 C)  Resp: [address] PA-C 09/22/15 [id number]  April Palumbo, MD 09/22/15 [phone]

## 2015-09-22 NOTE — ED Notes (Signed)
Bed: WA17 Expected date:  Expected time:  Means of arrival:  Comments: fall 

## 2015-09-22 NOTE — ED Notes (Signed)
[employer] notified of need for patient transport.

## 2015-10-18 ENCOUNTER — Emergency Department (HOSPITAL_COMMUNITY)
Admission: EM | Admit: 2015-10-18 | Discharge: 2015-10-18 | Disposition: A | Discharge status: Home / Self Care | Payer: Commercial Managed Care - HMO | Attending: Emergency Medicine | Admitting: Emergency Medicine

## 2015-10-18 ENCOUNTER — Encounter (HOSPITAL_COMMUNITY): Payer: Self-pay

## 2015-10-18 DIAGNOSIS — Z7982 Long term (current) use of aspirin: Secondary | ICD-10-CM | POA: Insufficient documentation

## 2015-10-18 DIAGNOSIS — Z951 Presence of aortocoronary bypass graft: Secondary | ICD-10-CM | POA: Diagnosis not present

## 2015-10-18 DIAGNOSIS — M1990 Unspecified osteoarthritis, unspecified site: Secondary | ICD-10-CM | POA: Diagnosis not present

## 2015-10-18 DIAGNOSIS — Z9181 History of falling: Secondary | ICD-10-CM | POA: Diagnosis not present

## 2015-10-18 DIAGNOSIS — Z79899 Other long term (current) drug therapy: Secondary | ICD-10-CM | POA: Insufficient documentation

## 2015-10-18 DIAGNOSIS — R112 Nausea with vomiting, unspecified: Secondary | ICD-10-CM | POA: Diagnosis not present

## 2015-10-18 DIAGNOSIS — Z87891 Personal history of nicotine dependence: Secondary | ICD-10-CM | POA: Diagnosis not present

## 2015-10-18 DIAGNOSIS — J449 Chronic obstructive pulmonary disease, unspecified: Secondary | ICD-10-CM | POA: Insufficient documentation

## 2015-10-18 DIAGNOSIS — Z8639 Personal history of other endocrine, nutritional and metabolic disease: Secondary | ICD-10-CM | POA: Diagnosis not present

## 2015-10-18 DIAGNOSIS — Z8546 Personal history of malignant neoplasm of prostate: Secondary | ICD-10-CM | POA: Insufficient documentation

## 2015-10-18 DIAGNOSIS — R197 Diarrhea, unspecified: Secondary | ICD-10-CM | POA: Diagnosis not present

## 2015-10-18 DIAGNOSIS — I10 Essential (primary) hypertension: Secondary | ICD-10-CM | POA: Diagnosis not present

## 2015-10-18 DIAGNOSIS — M109 Gout, unspecified: Secondary | ICD-10-CM | POA: Insufficient documentation

## 2015-10-18 DIAGNOSIS — G8929 Other chronic pain: Secondary | ICD-10-CM | POA: Diagnosis not present

## 2015-10-18 DIAGNOSIS — Z880 Allergy status to penicillin: Secondary | ICD-10-CM | POA: Diagnosis not present

## 2015-10-18 DIAGNOSIS — F419 Anxiety disorder, unspecified: Secondary | ICD-10-CM | POA: Diagnosis not present

## 2015-10-18 DIAGNOSIS — I2510 Atherosclerotic heart disease of native coronary artery without angina pectoris: Secondary | ICD-10-CM | POA: Insufficient documentation

## 2015-10-18 LAB — COMPREHENSIVE METABOLIC PANEL
ALK PHOS: 83 U/L (ref 38–126)
ALT: 19 U/L (ref 17–63)
AST: 41 U/L (ref 15–41)
Albumin: 3.5 g/dL (ref 3.5–5.0)
Anion gap: 11 (ref 5–15)
BUN: 35 mg/dL — ABNORMAL HIGH (ref 6–20)
CALCIUM: 8.8 mg/dL — AB (ref 8.9–10.3)
CO2: 27 mmol/L (ref 22–32)
CREATININE: 1.33 mg/dL — AB (ref 0.61–1.24)
Chloride: 102 mmol/L (ref 101–111)
GFR, EST AFRICAN AMERICAN: 53 mL/min — AB (ref >60)
GFR, EST NON AFRICAN AMERICAN: 46 mL/min — AB (ref >60)
Glucose, Bld: 121 mg/dL — ABNORMAL HIGH (ref 65–99)
Potassium: 3.7 mmol/L (ref 3.5–5.1)
Sodium: 140 mmol/L (ref 135–145)
Total Bilirubin: 0.8 mg/dL (ref 0.3–1.2)
Total Protein: 7 g/dL (ref 6.5–8.1)

## 2015-10-18 LAB — CBC
HCT: 38.8 % — ABNORMAL LOW (ref 39.0–52.0)
Hemoglobin: 12.7 g/dL — ABNORMAL LOW (ref 13.0–17.0)
MCH: 26.3 pg (ref 26.0–34.0)
MCHC: 32.7 g/dL (ref 30.0–36.0)
MCV: 80.3 fL (ref 78.0–100.0)
PLATELETS: 135 10*3/uL — AB (ref 150–400)
RBC: 4.83 MIL/uL (ref 4.22–5.81)
RDW: 15.9 % — AB (ref 11.5–15.5)
WBC: 12.6 10*3/uL — AB (ref 4.0–10.5)

## 2015-10-18 LAB — URINE MICROSCOPIC-ADD ON: RBC / HPF: NONE SEEN RBC/hpf (ref 0–5)

## 2015-10-18 LAB — LIPASE, BLOOD: Lipase: 23 U/L (ref 11–51)

## 2015-10-18 LAB — URINALYSIS, ROUTINE W REFLEX MICROSCOPIC
Glucose, UA: NEGATIVE mg/dL
HGB URINE DIPSTICK: NEGATIVE
KETONES UR: NEGATIVE mg/dL
Leukocytes, UA: NEGATIVE
Nitrite: NEGATIVE
PROTEIN: 30 mg/dL — AB
Specific Gravity, Urine: 1.019 (ref 1.005–1.030)
pH: 5.5 (ref 5.0–8.0)

## 2015-10-18 MED ORDER — SODIUM CHLORIDE 0.9 % IV BOLUS (SEPSIS)
1000.0000 mL | Freq: Once | INTRAVENOUS | Status: AC
  Administered 2015-10-18: 1000 mL via INTRAVENOUS

## 2015-10-18 MED ORDER — SODIUM CHLORIDE 0.9 % IV BOLUS (SEPSIS)
500.0000 mL | Freq: Once | INTRAVENOUS | Status: AC
  Administered 2015-10-18: 500 mL via INTRAVENOUS

## 2015-10-18 MED ORDER — ONDANSETRON HCL 4 MG/2ML IJ SOLN
4.0000 mg | Freq: Once | INTRAMUSCULAR | Status: AC
  Administered 2015-10-18: 4 mg via INTRAVENOUS
  Filled 2015-10-18: qty 2

## 2015-10-18 MED ORDER — ONDANSETRON 4 MG PO TBDP: ORAL_TABLET | ORAL | Status: AC

## 2015-10-18 NOTE — Discharge Instructions (Signed)
Follow up with your family doctor in one to 2 days. Be sure to drink plenty of fluids. Return for inability to eat or drink, fever, or sudden worsening abdominal pain. Diarrhea Diarrhea is watery poop (stool). It can make you feel weak, tired, thirsty, or give you a dry mouth (signs of dehydration). Watery poop is a sign of another problem, most often an infection. It often lasts 2-3 days. It can last longer if it is a sign of something serious. Take care of yourself as told by your doctor. HOME CARE   Drink 1 cup (8 ounces) of fluid each time you have watery poop.  Do not drink the following fluids:  Those that contain simple sugars (fructose, glucose, galactose, lactose, sucrose, maltose).  Sports drinks.  Fruit juices.  Whole milk products.  Sodas.  Drinks with caffeine (coffee, tea, soda) or alcohol.  Oral rehydration solution may be used if the doctor says it is okay. You may make your own solution. Follow this recipe:   - teaspoon table salt.   teaspoon baking soda.   teaspoon salt substitute containing potassium chloride.  1 tablespoons sugar.  1 liter (34 ounces) of water.  Avoid the following foods:  High fiber foods, such as raw fruits and vegetables.  Nuts, seeds, and whole grain breads and cereals.   Those that are sweetened with sugar alcohols (xylitol, sorbitol, mannitol).  Try eating the following foods:  Starchy foods, such as rice, toast, pasta, low-sugar cereal, oatmeal, baked potatoes, crackers, and bagels.  Bananas.  Applesauce.  Eat probiotic-rich foods, such as yogurt and milk products that are fermented.  Wash your hands well after each time you have watery poop.  Only take medicine as told by your doctor.  Take a warm bath to help lessen burning or pain from having watery poop. GET HELP RIGHT AWAY IF:   You cannot drink fluids without throwing up (vomiting).  You keep throwing up.  You have blood in your poop, or your poop looks  black and tarry.  You do not pee (urinate) in 6-8 hours, or there is only a small amount of very dark pee.  You have belly (abdominal) pain that gets worse or stays in the same spot (localizes).  You are weak, dizzy, confused, or light-headed.  You have a very bad headache.  Your watery poop gets worse or does not get better.  You have a fever or lasting symptoms for more than 2-3 days.  You have a fever and your symptoms suddenly get worse. MAKE SURE YOU:   Understand these instructions.  Will watch your condition.  Will get help right away if you are not doing well or get worse.   This information is not intended to replace advice given to you by your health care provider. Make sure you discuss any questions you have with your health care provider.   Document Released: 04/02/2008 Document Revised: 11/05/2014 Document Reviewed: 06/22/2012 Elsevier Interactive Patient Education [employer].

## 2015-10-18 NOTE — ED Notes (Signed)
[facility] called for transport.  

## 2015-10-18 NOTE — ED Notes (Signed)
Attempted to get urine sample, pt stated they are unable to urinate at this time

## 2015-10-18 NOTE — ED Notes (Signed)
Pt was given ginger ale and saltine crackers for PO challenge which he was able to tolerate.

## 2015-10-18 NOTE — ED Notes (Signed)
Per EMS, Pt, from [facility], c/o increasing weakness x 2 weeks and n/v/d x 1 day.  Denies pain.  Facility would like Pt evaluated for dehydration.  CBG 141.

## 2015-10-18 NOTE — ED Provider Notes (Signed)
CSN: [id number]     Arrival date & time 10/18/15  1131 History   First MD Initiated Contact with Patient 10/18/15 1148     Chief Complaint  Patient presents with  . Emesis  . Diarrhea     (Consider location/radiation/quality/duration/timing/severity/associated sxs/prior Treatment) Patient is a 81 y.o. male presenting with vomiting, diarrhea, and general illness. The history is provided by the patient.  Emesis Associated symptoms: diarrhea   Associated symptoms: no abdominal pain, no arthralgias, no chills, no headaches and no myalgias   Diarrhea Associated symptoms: vomiting   Associated symptoms: no abdominal pain, no arthralgias, no chills, no fever, no headaches and no myalgias   Illness Severity:  Moderate Onset quality:  Sudden Duration:  2 days Timing:  Constant Progression:  Worsening Chronicity:  New Associated symptoms: diarrhea, nausea and vomiting   Associated symptoms: no abdominal pain, no chest pain, no congestion, no fever, no headaches, no myalgias, no rash and no shortness of breath     81 yo M with a chief complaint of nausea vomiting and diarrhea. This been going on for the past couple days. Patient has been weak at home and the facility is concerned that maybe he is dehydrated. Patient denies any fevers or chills denies abdominal pain. Denies bloody or dark stool. Denies bloody or green emesis.  Past Medical History  Diagnosis Date  . Coronary artery disease   . Hypertension   . High cholesterol   . COPD (chronic obstructive pulmonary disease) ([place])   . Exertional dyspnea 08/20/2012  . History of blood transfusion 1994; 2004    "w/both bypass ORs"  . History of carotid endarterectomy 1994    right  . Recurrent falls     "last fall 08/15/2012" (08/20/2012)  . Arthritis     "I suffer all over my body; dr said it could be from arthritis" (08/20/2012)  . Chronic lower back pain   . Anxiety   . Mental disorder   . Prostate cancer [facility]) 1988     radiation treatment  . Hypothyroidism   . OSA (obstructive sleep apnea)   . Gout     "thumb" (08/20/2012)  . Incontinence of urine    Past Surgical History  Procedure Laterality Date  . Appendectomy    . Cataract extraction w/ intraocular lens  implant, bilateral    . Coronary artery bypass graft  1994; 2004  . Prostatectomy    . Hernia repair      UHR  . Cardiac valve replacement  2004    AVR  . Orchiectomy  2001    bilateral  . Penile prosthesis  removal  2001  . Circumcision  2001   Family History  Problem Relation Age of Onset  . Hypertension Father   . Depression Father    Social History  Substance Use Topics  . Smoking status: Former Smoker -- 2.50 packs/day for 55 years    Types: Cigarettes  . Smokeless tobacco: Never Used     Comment: 08/20/2012 "stopped smoking cigarettes ~ 1994"  . Alcohol Use: No    Review of Systems  Constitutional: Negative for fever and chills.  HENT: Negative for congestion and facial swelling.   Eyes: Negative for discharge and visual disturbance.  Respiratory: Negative for shortness of breath.   Cardiovascular: Negative for chest pain and palpitations.  Gastrointestinal: Positive for nausea, vomiting and diarrhea. Negative for abdominal pain.  Musculoskeletal: Negative for myalgias and arthralgias.  Skin: Negative for color change and rash.  Neurological:  Negative for tremors, syncope and headaches.  Psychiatric/Behavioral: Negative for confusion and dysphoric mood.      Allergies  Percocet; Zocor; and Penicillins  Home Medications   Prior to Admission medications   Medication Sig Start Date End Date Taking? Authorizing Provider  allopurinol (ZYLOPRIM) 100 MG tablet Take 100 mg by mouth daily.    Yes Historical Provider, MD  aspirin EC 81 MG tablet Take 81 mg by mouth daily.   Yes Historical Provider, MD  bimatoprost (LUMIGAN) 0.01 % SOLN Place 1 drop into both eyes at bedtime.   Yes Historical Provider, MD  cilostazol  (PLETAL) 50 MG tablet Take 50 mg by mouth 2 (two) times daily.   Yes Historical Provider, MD  clonazePAM (KLONOPIN) 2 MG tablet Take 2 mg by mouth at bedtime.   Yes Historical Provider, MD  Cyanocobalamin (VITAMIN B 12 PO) Take 1,000 mcg by mouth daily.   Yes Historical Provider, MD  finasteride (PROSCAR) 5 MG tablet Take 5 mg by mouth every morning.  02/04/15  Yes Historical Provider, MD  HYDROcodone-acetaminophen (NORCO/VICODIN) 5-325 MG tablet Take 1 tablet by mouth every 12 (twelve) hours as needed for moderate pain.   Yes Historical Provider, MD  memantine (NAMENDA) 10 MG tablet Take 10 mg by mouth 2 (two) times daily.   Yes Historical Provider, MD  metoprolol succinate (TOPROL-XL) 25 MG 24 hr tablet Take 25 mg by mouth daily.   Yes Historical Provider, MD  Mometasone Furoate [facility] HFA) 100 MCG/ACT AERO Inhale 2 puffs into the lungs 2 (two) times daily as needed (shortness of breathe).   Yes Historical Provider, MD  oxybutynin (DITROPAN) 5 MG tablet Take 5 mg by mouth daily.   Yes Historical Provider, MD  polyvinyl alcohol (LIQUIFILM TEARS) 1.4 % ophthalmic solution Place 1 drop into both eyes 2 (two) times daily as needed for dry eyes. For 6 days   Yes Historical Provider, MD   BP 127/72 mmHg  Pulse 113  Temp(Src) 99 F (37.2 C) (Oral)  Resp 16  SpO2 92% Physical Exam  Constitutional: He is oriented to person, place, and time. He appears well-developed and well-nourished.  HENT:  Head: Normocephalic and atraumatic.  Eyes: EOM are normal. Pupils are equal, round, and reactive to light.  Neck: Normal range of motion. Neck supple. No JVD present.  Cardiovascular: Normal rate and regular rhythm.  Exam reveals no gallop and no friction rub.   No murmur heard. Pulmonary/Chest: No respiratory distress. He has no wheezes.  Abdominal: He exhibits no distension. There is no tenderness. There is no rebound and no guarding.  Benign abdomen   Musculoskeletal: Normal range of motion.   Neurological: He is alert and oriented to person, place, and time.  Skin: No rash noted. No pallor.  Psychiatric: He has a normal mood and affect. His behavior is normal.  Nursing note and vitals reviewed.   ED Course  Procedures (including critical care time) Labs Review Labs Reviewed  LIPASE, BLOOD  COMPREHENSIVE METABOLIC PANEL  CBC  URINALYSIS, ROUTINE W REFLEX MICROSCOPIC (NOT AT [facility])    Imaging Review No results found. I have personally reviewed and evaluated these images and lab results as part of my medical decision-making.   EKG Interpretation None      MDM   Final diagnoses:  Nausea vomiting and diarrhea    81 yo M with a chief complaint of nausea vomiting and diarrhea. There is been an outbreak of normal virus at this facility. Likely the cause of  patient's symptoms. Patient tachycardic on arrival will give IV fluids check CBC BMP.  Labs with mild worsening of creatinine. Patient feeling much better after iv fluids, zofran.  Able to eat and drink without difficulty.  Patient unable to urinate normally, discussed with family will in and out cath.   Patient care turned over to Dr. Johnney Killian, awaiting UA.    I have discussed the diagnosis/risks/treatment options with the patient and family and believe the pt to be eligible for discharge home to follow-up with PCP. We also discussed returning to the ED immediately if new or worsening sx occur. We discussed the sx which are most concerning (e.g., sudden worsening pain, fever, inability to tolerate by mouth) that necessitate immediate return. Medications administered to the patient during their visit and any new prescriptions provided to the patient are listed below.  Medications given during this visit Medications  sodium chloride 0.9 % bolus 1,000 mL (0 mLs Intravenous Stopped 10/18/15 1759)  ondansetron (ZOFRAN) injection 4 mg (4 mg Intravenous Given 10/18/15 1257)  sodium chloride 0.9 % bolus 500 mL (0 mLs  Intravenous Stopped 10/18/15 1759)    Discharge Medication List as of 10/18/2015  4:00 PM    START taking these medications   Details  ondansetron (ZOFRAN ODT) 4 MG disintegrating tablet 4mg  ODT q4 hours prn nausea/vomit, Print        The patient appears reasonably screen and/or stabilized for discharge and I doubt any other medical condition or other EMC requiring further screening, evaluation, or treatment in the ED at this time prior to discharge.     Deno Etienne, DO 10/19/15 [id number]

## 2015-10-18 NOTE — ED Notes (Signed)
Pt still unable to give urine sample

## 2015-10-18 NOTE — ED Notes (Signed)
Nurse going to draw labs when starting IV

## 2015-11-25 ENCOUNTER — Inpatient Hospital Stay (HOSPITAL_COMMUNITY): Payer: PPO

## 2015-11-25 ENCOUNTER — Emergency Department (HOSPITAL_COMMUNITY): Payer: PPO | Admitting: Certified Registered"

## 2015-11-25 ENCOUNTER — Emergency Department (HOSPITAL_COMMUNITY): Payer: PPO

## 2015-11-25 ENCOUNTER — Encounter (HOSPITAL_COMMUNITY): Payer: Self-pay | Admitting: Radiology

## 2015-11-25 ENCOUNTER — Inpatient Hospital Stay (HOSPITAL_COMMUNITY)
Admission: EM | Admit: 2015-11-25 | Discharge: 2015-11-30 | DRG: 023 | Disposition: E | Payer: PPO | Attending: Neurology | Admitting: Neurology

## 2015-11-25 ENCOUNTER — Encounter (HOSPITAL_COMMUNITY): Admission: EM | Disposition: E | Payer: Self-pay | Source: Home / Self Care | Attending: Neurology

## 2015-11-25 DIAGNOSIS — E872 Acidosis: Secondary | ICD-10-CM | POA: Diagnosis present

## 2015-11-25 DIAGNOSIS — I638 Other cerebral infarction: Secondary | ICD-10-CM | POA: Diagnosis not present

## 2015-11-25 DIAGNOSIS — I4510 Unspecified right bundle-branch block: Secondary | ICD-10-CM | POA: Diagnosis present

## 2015-11-25 DIAGNOSIS — G4733 Obstructive sleep apnea (adult) (pediatric): Secondary | ICD-10-CM | POA: Diagnosis present

## 2015-11-25 DIAGNOSIS — I6330 Cerebral infarction due to thrombosis of unspecified cerebral artery: Secondary | ICD-10-CM | POA: Diagnosis not present

## 2015-11-25 DIAGNOSIS — N390 Urinary tract infection, site not specified: Secondary | ICD-10-CM | POA: Diagnosis present

## 2015-11-25 DIAGNOSIS — G3189 Other specified degenerative diseases of nervous system: Secondary | ICD-10-CM | POA: Diagnosis present

## 2015-11-25 DIAGNOSIS — J9601 Acute respiratory failure with hypoxia: Secondary | ICD-10-CM | POA: Diagnosis not present

## 2015-11-25 DIAGNOSIS — E876 Hypokalemia: Secondary | ICD-10-CM | POA: Diagnosis present

## 2015-11-25 DIAGNOSIS — Z8546 Personal history of malignant neoplasm of prostate: Secondary | ICD-10-CM | POA: Diagnosis not present

## 2015-11-25 DIAGNOSIS — J449 Chronic obstructive pulmonary disease, unspecified: Secondary | ICD-10-CM | POA: Diagnosis present

## 2015-11-25 DIAGNOSIS — I2510 Atherosclerotic heart disease of native coronary artery without angina pectoris: Secondary | ICD-10-CM | POA: Diagnosis present

## 2015-11-25 DIAGNOSIS — N179 Acute kidney failure, unspecified: Secondary | ICD-10-CM | POA: Diagnosis present

## 2015-11-25 DIAGNOSIS — Z951 Presence of aortocoronary bypass graft: Secondary | ICD-10-CM | POA: Diagnosis not present

## 2015-11-25 DIAGNOSIS — D649 Anemia, unspecified: Secondary | ICD-10-CM | POA: Diagnosis present

## 2015-11-25 DIAGNOSIS — I618 Other nontraumatic intracerebral hemorrhage: Secondary | ICD-10-CM | POA: Diagnosis not present

## 2015-11-25 DIAGNOSIS — Z6824 Body mass index (BMI) 24.0-24.9, adult: Secondary | ICD-10-CM

## 2015-11-25 DIAGNOSIS — Z87891 Personal history of nicotine dependence: Secondary | ICD-10-CM

## 2015-11-25 DIAGNOSIS — E7800 Pure hypercholesterolemia, unspecified: Secondary | ICD-10-CM | POA: Diagnosis present

## 2015-11-25 DIAGNOSIS — Z952 Presence of prosthetic heart valve: Secondary | ICD-10-CM

## 2015-11-25 DIAGNOSIS — R4701 Aphasia: Secondary | ICD-10-CM | POA: Diagnosis present

## 2015-11-25 DIAGNOSIS — R4182 Altered mental status, unspecified: Secondary | ICD-10-CM | POA: Diagnosis present

## 2015-11-25 DIAGNOSIS — D696 Thrombocytopenia, unspecified: Secondary | ICD-10-CM | POA: Diagnosis present

## 2015-11-25 DIAGNOSIS — Z7902 Long term (current) use of antithrombotics/antiplatelets: Secondary | ICD-10-CM

## 2015-11-25 DIAGNOSIS — T45615A Adverse effect of thrombolytic drugs, initial encounter: Secondary | ICD-10-CM | POA: Diagnosis not present

## 2015-11-25 DIAGNOSIS — I610 Nontraumatic intracerebral hemorrhage in hemisphere, subcortical: Secondary | ICD-10-CM

## 2015-11-25 DIAGNOSIS — I63512 Cerebral infarction due to unspecified occlusion or stenosis of left middle cerebral artery: Principal | ICD-10-CM

## 2015-11-25 DIAGNOSIS — G8191 Hemiplegia, unspecified affecting right dominant side: Secondary | ICD-10-CM | POA: Diagnosis present

## 2015-11-25 DIAGNOSIS — J9600 Acute respiratory failure, unspecified whether with hypoxia or hypercapnia: Secondary | ICD-10-CM

## 2015-11-25 DIAGNOSIS — G8929 Other chronic pain: Secondary | ICD-10-CM | POA: Diagnosis present

## 2015-11-25 DIAGNOSIS — Z7982 Long term (current) use of aspirin: Secondary | ICD-10-CM

## 2015-11-25 DIAGNOSIS — I481 Persistent atrial fibrillation: Secondary | ICD-10-CM | POA: Diagnosis present

## 2015-11-25 DIAGNOSIS — T85598A Other mechanical complication of other gastrointestinal prosthetic devices, implants and grafts, initial encounter: Secondary | ICD-10-CM

## 2015-11-25 DIAGNOSIS — R578 Other shock: Secondary | ICD-10-CM | POA: Diagnosis present

## 2015-11-25 DIAGNOSIS — Q251 Coarctation of aorta: Secondary | ICD-10-CM | POA: Diagnosis not present

## 2015-11-25 DIAGNOSIS — Z515 Encounter for palliative care: Secondary | ICD-10-CM | POA: Diagnosis present

## 2015-11-25 DIAGNOSIS — M109 Gout, unspecified: Secondary | ICD-10-CM | POA: Diagnosis present

## 2015-11-25 DIAGNOSIS — M545 Low back pain: Secondary | ICD-10-CM | POA: Diagnosis present

## 2015-11-25 DIAGNOSIS — Z452 Encounter for adjustment and management of vascular access device: Secondary | ICD-10-CM

## 2015-11-25 DIAGNOSIS — J90 Pleural effusion, not elsewhere classified: Secondary | ICD-10-CM | POA: Diagnosis not present

## 2015-11-25 DIAGNOSIS — I10 Essential (primary) hypertension: Secondary | ICD-10-CM | POA: Diagnosis present

## 2015-11-25 DIAGNOSIS — I6789 Other cerebrovascular disease: Secondary | ICD-10-CM | POA: Diagnosis not present

## 2015-11-25 DIAGNOSIS — R32 Unspecified urinary incontinence: Secondary | ICD-10-CM | POA: Diagnosis present

## 2015-11-25 DIAGNOSIS — E669 Obesity, unspecified: Secondary | ICD-10-CM | POA: Diagnosis present

## 2015-11-25 DIAGNOSIS — E039 Hypothyroidism, unspecified: Secondary | ICD-10-CM | POA: Diagnosis present

## 2015-11-25 DIAGNOSIS — I4891 Unspecified atrial fibrillation: Secondary | ICD-10-CM | POA: Diagnosis not present

## 2015-11-25 DIAGNOSIS — Z923 Personal history of irradiation: Secondary | ICD-10-CM | POA: Diagnosis not present

## 2015-11-25 DIAGNOSIS — I619 Nontraumatic intracerebral hemorrhage, unspecified: Secondary | ICD-10-CM

## 2015-11-25 DIAGNOSIS — I639 Cerebral infarction, unspecified: Secondary | ICD-10-CM

## 2015-11-25 DIAGNOSIS — Z66 Do not resuscitate: Secondary | ICD-10-CM | POA: Diagnosis not present

## 2015-11-25 DIAGNOSIS — Z01818 Encounter for other preprocedural examination: Secondary | ICD-10-CM

## 2015-11-25 HISTORY — PX: RADIOLOGY WITH ANESTHESIA: SHX6223

## 2015-11-25 LAB — DIFFERENTIAL
BASOS ABS: 0 10*3/uL (ref 0.0–0.1)
BASOS PCT: 0 %
EOS ABS: 0.4 10*3/uL (ref 0.0–0.7)
Eosinophils Relative: 5 %
Lymphocytes Relative: 26 %
Lymphs Abs: 2.6 10*3/uL (ref 0.7–4.0)
MONOS PCT: 7 %
Monocytes Absolute: 0.7 10*3/uL (ref 0.1–1.0)
NEUTROS PCT: 63 %
Neutro Abs: 6.2 10*3/uL (ref 1.7–7.7)

## 2015-11-25 LAB — COMPREHENSIVE METABOLIC PANEL
ALT: 10 U/L — ABNORMAL LOW (ref 17–63)
AST: 21 U/L (ref 15–41)
Albumin: 3.3 g/dL — ABNORMAL LOW (ref 3.5–5.0)
Alkaline Phosphatase: 101 U/L (ref 38–126)
Anion gap: 10 (ref 5–15)
BUN: 16 mg/dL (ref 6–20)
CO2: 25 mmol/L (ref 22–32)
Calcium: 9.1 mg/dL (ref 8.9–10.3)
Chloride: 105 mmol/L (ref 101–111)
Creatinine, Ser: 1.13 mg/dL (ref 0.61–1.24)
GFR calc Af Amer: >60 mL/min (ref >60)
GFR, EST NON AFRICAN AMERICAN: 56 mL/min — AB (ref >60)
Glucose, Bld: 114 mg/dL — ABNORMAL HIGH (ref 65–99)
POTASSIUM: 4.2 mmol/L (ref 3.5–5.1)
Sodium: 140 mmol/L (ref 135–145)
Total Bilirubin: 0.4 mg/dL (ref 0.3–1.2)
Total Protein: 6.6 g/dL (ref 6.5–8.1)

## 2015-11-25 LAB — APTT: APTT: 33 s (ref 24–37)

## 2015-11-25 LAB — POCT I-STAT 3, ART BLOOD GAS (G3+)
Acid-base deficit: 4 mmol/L — ABNORMAL HIGH (ref 0.0–2.0)
Acid-base deficit: 12 mmol/L — ABNORMAL HIGH (ref 0.0–2.0)
BICARBONATE: 14 meq/L — AB (ref 20.0–24.0)
Bicarbonate: 21.2 mEq/L (ref 20.0–24.0)
O2 SAT: 93 %
O2 Saturation: 100 %
PCO2 ART: 35.7 mmHg (ref 35.0–45.0)
PCO2 ART: 33 mmHg — AB (ref 35.0–45.0)
PH ART: 7.377 (ref 7.350–7.450)
Patient temperature: 97
Patient temperature: 98.6
TCO2: 22 mmol/L (ref 0–100)
TCO2: 15 mmol/L (ref 0–100)
pH, Arterial: 7.236 — ABNORMAL LOW (ref 7.350–7.450)
pO2, Arterial: 409 mmHg — ABNORMAL HIGH (ref 80.0–100.0)
pO2, Arterial: 79 mmHg — ABNORMAL LOW (ref 80.0–100.0)

## 2015-11-25 LAB — URINALYSIS, ROUTINE W REFLEX MICROSCOPIC
Bilirubin Urine: NEGATIVE
Glucose, UA: NEGATIVE mg/dL
Hgb urine dipstick: NEGATIVE
Ketones, ur: NEGATIVE mg/dL
NITRITE: NEGATIVE
PROTEIN: NEGATIVE mg/dL
SPECIFIC GRAVITY, URINE: 1.022 (ref 1.005–1.030)
pH: 7.5 (ref 5.0–8.0)

## 2015-11-25 LAB — CBC
HCT: 20.4 % — ABNORMAL LOW (ref 39.0–52.0)
HEMATOCRIT: 34.7 % — AB (ref 39.0–52.0)
Hemoglobin: 10.8 g/dL — ABNORMAL LOW (ref 13.0–17.0)
Hemoglobin: 6.4 g/dL — CL (ref 13.0–17.0)
MCH: 24.5 pg — ABNORMAL LOW (ref 26.0–34.0)
MCH: 25 pg — ABNORMAL LOW (ref 26.0–34.0)
MCHC: 31.1 g/dL (ref 30.0–36.0)
MCHC: 31.4 g/dL (ref 30.0–36.0)
MCV: 78.7 fL (ref 78.0–100.0)
MCV: 79.7 fL (ref 78.0–100.0)
PLATELETS: 182 10*3/uL (ref 150–400)
PLATELETS: 230 10*3/uL (ref 150–400)
RBC: 4.41 MIL/uL (ref 4.22–5.81)
RBC: 2.56 MIL/uL — ABNORMAL LOW (ref 4.22–5.81)
RDW: 15.4 % (ref 11.5–15.5)
RDW: 15.6 % — AB (ref 11.5–15.5)
WBC: 9.8 10*3/uL (ref 4.0–10.5)
WBC: 24.9 10*3/uL — AB (ref 4.0–10.5)

## 2015-11-25 LAB — I-STAT TROPONIN, ED: TROPONIN I, POC: 0.01 ng/mL (ref 0.00–0.08)

## 2015-11-25 LAB — I-STAT CHEM 8, ED
BUN: 18 mg/dL (ref 6–20)
CALCIUM ION: 1.11 mmol/L — AB (ref 1.13–1.30)
Chloride: 103 mmol/L (ref 101–111)
Creatinine, Ser: 1 mg/dL (ref 0.61–1.24)
GLUCOSE: 113 mg/dL — AB (ref 65–99)
HCT: 36 % — ABNORMAL LOW (ref 39.0–52.0)
HEMOGLOBIN: 12.2 g/dL — AB (ref 13.0–17.0)
Potassium: 4.1 mmol/L (ref 3.5–5.1)
SODIUM: 141 mmol/L (ref 135–145)
TCO2: 25 mmol/L (ref 0–100)

## 2015-11-25 LAB — PREPARE RBC (CROSSMATCH)

## 2015-11-25 LAB — TSH: TSH: 3.363 u[IU]/mL (ref 0.350–4.500)

## 2015-11-25 LAB — URINE MICROSCOPIC-ADD ON

## 2015-11-25 LAB — CBG MONITORING, ED: Glucose-Capillary: 103 mg/dL — ABNORMAL HIGH (ref 65–99)

## 2015-11-25 LAB — PROTIME-INR
INR: 1.01 (ref 0.00–1.49)
PROTHROMBIN TIME: 13.5 s (ref 11.6–15.2)

## 2015-11-25 LAB — MRSA PCR SCREENING: MRSA by PCR: NEGATIVE

## 2015-11-25 LAB — TRIGLYCERIDES: Triglycerides: 141 mg/dL (ref <150)

## 2015-11-25 SURGERY — RADIOLOGY WITH ANESTHESIA
Anesthesia: General

## 2015-11-25 MED ORDER — BISACODYL 10 MG RE SUPP: 10.0000 mg | Freq: Every day | RECTAL | Status: DC | PRN

## 2015-11-25 MED ORDER — SODIUM CHLORIDE 0.9 % IV SOLN: Freq: Once | INTRAVENOUS | Status: DC

## 2015-11-25 MED ORDER — CHLORHEXIDINE GLUCONATE 0.12% ORAL RINSE (MEDLINE KIT)
15.0000 mL | Freq: Two times a day (BID) | OROMUCOSAL | Status: DC
  Administered 2015-11-25 – 2015-11-27 (×5): 15 mL via OROMUCOSAL

## 2015-11-25 MED ORDER — ALTEPLASE (STROKE) FULL DOSE INFUSION
0.9000 mg/kg | Freq: Once | INTRAVENOUS | Status: DC
  Filled 2015-11-25: qty 100

## 2015-11-25 MED ORDER — ONDANSETRON HCL 4 MG/2ML IJ SOLN: 4.0000 mg | Freq: Four times a day (QID) | INTRAMUSCULAR | Status: DC | PRN

## 2015-11-25 MED ORDER — VANCOMYCIN HCL IN DEXTROSE 1-5 GM/200ML-% IV SOLN
1000.0000 mg | INTRAVENOUS | Status: DC
  Filled 2015-11-25: qty 200

## 2015-11-25 MED ORDER — PANTOPRAZOLE SODIUM 40 MG IV SOLR
40.0000 mg | Freq: Every day | INTRAVENOUS | Status: DC
  Administered 2015-11-26 – 2015-11-28 (×4): 40 mg via INTRAVENOUS
  Filled 2015-11-25 (×4): qty 40

## 2015-11-25 MED ORDER — FENTANYL CITRATE (PF) 100 MCG/2ML IJ SOLN
25.0000 ug | Freq: Once | INTRAMUSCULAR | Status: AC
  Administered 2015-11-25: 25 ug via INTRAVENOUS

## 2015-11-25 MED ORDER — STROKE: EARLY STAGES OF RECOVERY BOOK
Freq: Once | Status: DC
  Filled 2015-11-25: qty 1

## 2015-11-25 MED ORDER — PROPOFOL 10 MG/ML IV BOLUS
INTRAVENOUS | Status: DC | PRN
  Administered 2015-11-25: 20 mg via INTRAVENOUS

## 2015-11-25 MED ORDER — PHENYLEPHRINE HCL 10 MG/ML IJ SOLN
10.0000 mg | INTRAMUSCULAR | Status: DC | PRN
  Administered 2015-11-25: 20 ug/min via INTRAVENOUS

## 2015-11-25 MED ORDER — IOHEXOL 300 MG/ML  SOLN
300.0000 mL | Freq: Once | INTRAMUSCULAR | Status: AC | PRN
  Administered 2015-11-25: 170 mL via INTRAVENOUS

## 2015-11-25 MED ORDER — ALBUTEROL SULFATE (2.5 MG/3ML) 0.083% IN NEBU: 2.5000 mg | INHALATION_SOLUTION | RESPIRATORY_TRACT | Status: DC | PRN

## 2015-11-25 MED ORDER — ALTEPLASE 30 MG/30 ML FOR INTERV. RAD
1.0000 mg | INTRA_ARTERIAL | Status: AC
  Filled 2015-11-25: qty 30

## 2015-11-25 MED ORDER — IPRATROPIUM-ALBUTEROL 0.5-2.5 (3) MG/3ML IN SOLN
3.0000 mL | Freq: Four times a day (QID) | RESPIRATORY_TRACT | Status: DC
  Administered 2015-11-25 – 2015-11-29 (×16): 3 mL via RESPIRATORY_TRACT
  Filled 2015-11-25 (×15): qty 3

## 2015-11-25 MED ORDER — SODIUM CHLORIDE 0.9 % IV SOLN
INTRAVENOUS | Status: DC | PRN
  Administered 2015-11-25 (×2): via INTRAVENOUS

## 2015-11-25 MED ORDER — ACETAMINOPHEN 500 MG PO TABS
1000.0000 mg | ORAL_TABLET | Freq: Four times a day (QID) | ORAL | Status: DC | PRN
  Administered 2015-11-27: 1000 mg via ORAL
  Filled 2015-11-25: qty 2

## 2015-11-25 MED ORDER — SENNOSIDES-DOCUSATE SODIUM 8.6-50 MG PO TABS: 1.0000 | ORAL_TABLET | Freq: Every evening | ORAL | Status: DC | PRN

## 2015-11-25 MED ORDER — LACTATED RINGERS IV BOLUS (SEPSIS)
1000.0000 mL | Freq: Once | INTRAVENOUS | Status: AC
  Administered 2015-11-25: 1000 mL via INTRAVENOUS

## 2015-11-25 MED ORDER — FENTANYL CITRATE (PF) 250 MCG/5ML IJ SOLN
INTRAMUSCULAR | Status: DC | PRN
  Administered 2015-11-25: 100 ug via INTRAVENOUS

## 2015-11-25 MED ORDER — FENTANYL CITRATE (PF) 100 MCG/2ML IJ SOLN: 50.0000 ug | INTRAMUSCULAR | Status: DC | PRN

## 2015-11-25 MED ORDER — SODIUM CHLORIDE 0.9 % IV BOLUS (SEPSIS)
1000.0000 mL | Freq: Once | INTRAVENOUS | Status: AC
  Administered 2015-11-25: 1000 mL via INTRAVENOUS

## 2015-11-25 MED ORDER — SODIUM CHLORIDE 0.9 % IV SOLN
50.0000 mL | Freq: Once | INTRAVENOUS | Status: AC
  Administered 2015-11-25: 73 mL via INTRAVENOUS

## 2015-11-25 MED ORDER — PROPOFOL 500 MG/50ML IV EMUL
INTRAVENOUS | Status: DC | PRN
  Administered 2015-11-25: 40 ug/kg/min via INTRAVENOUS

## 2015-11-25 MED ORDER — NICARDIPINE HCL IN NACL 20-0.86 MG/200ML-% IV SOLN
INTRAVENOUS | Status: AC
  Filled 2015-11-25: qty 200

## 2015-11-25 MED ORDER — SUCCINYLCHOLINE CHLORIDE 20 MG/ML IJ SOLN
INTRAMUSCULAR | Status: DC | PRN
  Administered 2015-11-25: 100 mg via INTRAVENOUS

## 2015-11-25 MED ORDER — LABETALOL HCL 5 MG/ML IV SOLN
5.0000 mg | INTRAVENOUS | Status: DC | PRN
  Administered 2015-11-26 – 2015-11-28 (×5): 5 mg via INTRAVENOUS
  Filled 2015-11-25 (×4): qty 4

## 2015-11-25 MED ORDER — FENTANYL CITRATE (PF) 100 MCG/2ML IJ SOLN
50.0000 ug | INTRAMUSCULAR | Status: DC | PRN
  Administered 2015-11-25 (×2): 50 ug via INTRAVENOUS
  Filled 2015-11-25 (×2): qty 2

## 2015-11-25 MED ORDER — NITROGLYCERIN 1 MG/10 ML FOR IR/CATH LAB
INTRA_ARTERIAL | Status: AC
  Administered 2015-11-25: 1000 ug
  Filled 2015-11-25: qty 10

## 2015-11-25 MED ORDER — PHENYLEPHRINE HCL 10 MG/ML IJ SOLN
0.0000 ug/min | INTRAVENOUS | Status: DC
  Administered 2015-11-25: 200 ug/min via INTRAVENOUS
  Filled 2015-11-25 (×2): qty 1

## 2015-11-25 MED ORDER — IPRATROPIUM-ALBUTEROL 0.5-2.5 (3) MG/3ML IN SOLN
RESPIRATORY_TRACT | Status: AC
  Filled 2015-11-25: qty 3

## 2015-11-25 MED ORDER — SODIUM CHLORIDE 0.9 % IV SOLN
INTRAVENOUS | Status: DC
  Administered 2015-11-25 – 2015-11-29 (×3): via INTRAVENOUS

## 2015-11-25 MED ORDER — METOPROLOL TARTRATE 1 MG/ML IV SOLN
INTRAVENOUS | Status: AC
  Administered 2015-11-25: 2.5 mg via INTRAVENOUS
  Filled 2015-11-25: qty 5

## 2015-11-25 MED ORDER — PROPOFOL 1000 MG/100ML IV EMUL
0.0000 ug/kg/min | INTRAVENOUS | Status: DC
  Administered 2015-11-26: 30 ug/kg/min via INTRAVENOUS
  Administered 2015-11-26: 25 ug/kg/min via INTRAVENOUS
  Administered 2015-11-27: 20 ug/kg/min via INTRAVENOUS
  Filled 2015-11-25 (×5): qty 100

## 2015-11-25 MED ORDER — ANTISEPTIC ORAL RINSE SOLUTION (CORINZ)
7.0000 mL | Freq: Four times a day (QID) | OROMUCOSAL | Status: DC
  Administered 2015-11-26 – 2015-11-27 (×8): 7 mL via OROMUCOSAL

## 2015-11-25 MED ORDER — FENTANYL CITRATE (PF) 100 MCG/2ML IJ SOLN
50.0000 ug | INTRAMUSCULAR | Status: DC | PRN
  Administered 2015-11-29 (×2): 50 ug via INTRAVENOUS
  Filled 2015-11-25 (×3): qty 2

## 2015-11-25 MED ORDER — IOHEXOL 350 MG/ML SOLN
50.0000 mL | Freq: Once | INTRAVENOUS | Status: AC | PRN
  Administered 2015-11-25: 50 mL via INTRAVENOUS

## 2015-11-25 MED ORDER — LEVOFLOXACIN IN D5W 500 MG/100ML IV SOLN
500.0000 mg | INTRAVENOUS | Status: DC
  Administered 2015-11-26 – 2015-11-28 (×3): 500 mg via INTRAVENOUS
  Filled 2015-11-25 (×5): qty 100

## 2015-11-25 MED ORDER — METOPROLOL TARTRATE 1 MG/ML IV SOLN
2.5000 mg | Freq: Once | INTRAVENOUS | Status: AC
  Administered 2015-11-25: 2.5 mg via INTRAVENOUS

## 2015-11-25 MED ORDER — NICARDIPINE HCL IN NACL 20-0.86 MG/200ML-% IV SOLN: 3.0000 mg/h | INTRAVENOUS | Status: DC

## 2015-11-25 MED ORDER — ALTEPLASE (STROKE) FULL DOSE INFUSION
0.9000 mg/kg | Freq: Once | INTRAVENOUS | Status: DC
  Administered 2015-11-25: 73 mg via INTRAVENOUS
  Filled 2015-11-25: qty 100

## 2015-11-25 MED ORDER — ROCURONIUM BROMIDE 100 MG/10ML IV SOLN
INTRAVENOUS | Status: DC | PRN
  Administered 2015-11-25: 20 mg via INTRAVENOUS
  Administered 2015-11-25: 30 mg via INTRAVENOUS
  Administered 2015-11-25: 20 mg via INTRAVENOUS

## 2015-11-25 MED ORDER — EPHEDRINE SULFATE 50 MG/ML IJ SOLN
INTRAMUSCULAR | Status: DC | PRN
  Administered 2015-11-25: 10 mg via INTRAVENOUS

## 2015-11-25 MED ORDER — LABETALOL HCL 5 MG/ML IV SOLN
INTRAVENOUS | Status: AC
  Administered 2015-11-25: 5 mg
  Filled 2015-11-25: qty 4

## 2015-11-25 MED ORDER — AMIODARONE HCL IN DEXTROSE 360-4.14 MG/200ML-% IV SOLN
30.0000 mg/h | INTRAVENOUS | Status: DC
  Administered 2015-11-26 – 2015-11-29 (×7): 30 mg/h via INTRAVENOUS
  Filled 2015-11-25 (×16): qty 200

## 2015-11-25 MED ORDER — FENTANYL CITRATE (PF) 100 MCG/2ML IJ SOLN
100.0000 ug | Freq: Once | INTRAMUSCULAR | Status: AC
  Administered 2015-11-25: 100 ug via INTRAVENOUS

## 2015-11-25 MED ORDER — AMIODARONE LOAD VIA INFUSION
150.0000 mg | Freq: Once | INTRAVENOUS | Status: AC
  Administered 2015-11-25: 150 mg via INTRAVENOUS
  Filled 2015-11-25: qty 83.34

## 2015-11-25 MED ORDER — NICARDIPINE HCL IN NACL 20-0.86 MG/200ML-% IV SOLN: 5.0000 mg/h | INTRAVENOUS | Status: DC

## 2015-11-25 MED ORDER — SODIUM CHLORIDE 0.9 % IV SOLN: INTRAVENOUS | Status: DC

## 2015-11-25 MED ORDER — VANCOMYCIN HCL IN DEXTROSE 1-5 GM/200ML-% IV SOLN
1000.0000 mg | Freq: Once | INTRAVENOUS | Status: AC
  Administered 2015-11-25: 1000 mg via INTRAVENOUS
  Filled 2015-11-25: qty 200

## 2015-11-25 MED ORDER — LIDOCAINE HCL (CARDIAC) 20 MG/ML IV SOLN
INTRAVENOUS | Status: DC | PRN
  Administered 2015-11-25: 30 mg via INTRAVENOUS

## 2015-11-25 MED ORDER — ACETAMINOPHEN 650 MG RE SUPP: 650.0000 mg | Freq: Four times a day (QID) | RECTAL | Status: DC | PRN

## 2015-11-25 MED ORDER — AMIODARONE HCL IN DEXTROSE 360-4.14 MG/200ML-% IV SOLN
60.0000 mg/h | INTRAVENOUS | Status: AC
  Administered 2015-11-25: 60 mg/h via INTRAVENOUS
  Filled 2015-11-25: qty 200

## 2015-11-25 NOTE — Consult Note (Signed)
PULMONARY / CRITICAL CARE MEDICINE   Name: Logan Allen MRN: [id number] DOB: 1924/08/31    ADMISSION DATE:  11/08/2015 CONSULTATION DATE:  11/20/2015  REFERRING MD:  Dr. Silverio Decamp  CHIEF COMPLAINT:  CVA   HISTORY OF PRESENT ILLNESS:   82 y/o M with a PMH of HTN, CAD s/p CABG, carotid endarterectomy, arthritis, recurrent falls, chronic lower back pain, prostate cancer (1998), hypothyroidism, gout, OSA and COPD who presented to [facility] on 1/27 from a  SNF with reports of right sided weakness and aphasia.    SNF staff reported he was last seen normal around 7am.  Code Stroke was activated in ER.  Initial CT of the head demonstrated no acute intracranial abnormality.  Repeat imaging with CTA of head / neck demonstrated MCA occlusion.   After Neurology evaluation, he was treated with TPA and transported to neuro-interventional radiology for MCA occlusion.  Initial BP's 170/180's.  Labs - Na 140, K 4.2, Cl 105, Cr 1.13, glucose 114, albumin 3.3, troponin 0.01, WBC 9.8, Hgb 10.8, and platelets 182.  Post neuro-IR, repeat imaging performed with concern for contrast staining vs coexistent hemorrhage.  Returned to ICU post procedure on mechanical ventilation.  PCCM consulted for evaluation.  PAST MEDICAL HISTORY :  He  has a past medical history of Coronary artery disease; Hypertension; High cholesterol; COPD (chronic obstructive pulmonary disease) ([place]); Exertional dyspnea (08/20/2012); History of blood transfusion (1994; 2004); History of carotid endarterectomy (1994); Recurrent falls; Arthritis; Chronic lower back pain; Anxiety; Mental disorder; Prostate cancer ([place]) (1988); Hypothyroidism; OSA (obstructive sleep apnea); Gout; and Incontinence of urine.  PAST SURGICAL HISTORY: He  has past surgical history that includes Appendectomy; Cataract extraction w/ intraocular lens  implant, bilateral; Coronary artery bypass graft (1994; 2004); Prostatectomy; Hernia repair; Cardiac valve replacement (2004); Orchiectomy  (2001); Penile prosthesis removal (2001); and Circumcision (2001).  ALLERGIES:  PCN, zocor, percocet   No current facility-administered medications on file prior to encounter.   Current Outpatient Prescriptions on File Prior to Encounter  Medication Sig  . allopurinol (ZYLOPRIM) 100 MG tablet Take 100 mg by mouth daily.   Marland Kitchen aspirin EC 81 MG tablet Take 81 mg by mouth daily.  . bimatoprost (LUMIGAN) 0.01 % SOLN Place 1 drop into both eyes at bedtime.  . cilostazol (PLETAL) 50 MG tablet Take 50 mg by mouth 2 (two) times daily.  . clonazePAM (KLONOPIN) 2 MG tablet Take 2 mg by mouth at bedtime.  . Cyanocobalamin (VITAMIN B 12 PO) Take 1,000 mcg by mouth daily.  . finasteride (PROSCAR) 5 MG tablet Take 5 mg by mouth every morning.   Marland Kitchen HYDROcodone-acetaminophen (NORCO/VICODIN) 5-325 MG tablet Take 1 tablet by mouth every 12 (twelve) hours as needed for moderate pain.  . memantine (NAMENDA) 10 MG tablet Take 10 mg by mouth 2 (two) times daily.  . metoprolol succinate (TOPROL-XL) 25 MG 24 hr tablet Take 25 mg by mouth daily.  . Mometasone Furoate (ASMANEX HFA) 100 MCG/ACT AERO Inhale 2 puffs into the lungs 2 (two) times daily as needed (shortness of breathe).  . ondansetron (ZOFRAN ODT) 4 MG disintegrating tablet 4mg  ODT q4 hours prn nausea/vomit  . oxybutynin (DITROPAN) 5 MG tablet Take 5 mg by mouth daily.  . polyvinyl alcohol (LIQUIFILM TEARS) 1.4 % ophthalmic solution Place 1 drop into both eyes 2 (two) times daily as needed for dry eyes. For 6 days    FAMILY HISTORY:  His indicated that his mother is deceased. He indicated that his father is deceased.  SOCIAL HISTORY: He  reports that he has quit smoking. His smoking use included Cigarettes. He has a 137.5 pack-year smoking history. He has never used smokeless tobacco. He reports that he does not drink alcohol or use illicit drugs.  REVIEW OF SYSTEMS:   Unable to complete as patient is altered on mechanical ventilation.   SUBJECTIVE:  RN reports labile BP - up/down titration of cardene.  Returned from neuro-IR on neo + cardene.    VITAL SIGNS: BP 186/88 mmHg  Pulse 79  Resp 14  Ht 5\' 9"  (1.753 m)  Wt 178 lb 9.2 oz (81 kg)  BMI 26.36 kg/m2  SpO2 94%  HEMODYNAMICS:    VENTILATOR SETTINGS:    INTAKE / OUTPUT:    PHYSICAL EXAMINATION: General:  Chronically ill appearing, frail elder on vent  Neuro:  Sedate HEENT:  MM pink/moist, no jvd Cardiovascular:  s1s2 rrr, no m/r/g Lungs:  Even/non-labored, diminished on L Abdomen:  Obese/soft, bsx4 active  Musculoskeletal:  No acute deformities  Skin:  Warm/dry, multiple areas of ecchymosis, R groin hematoma with sheath in place, soft  LABS:  BMET  Recent Labs Lab 11/06/2015 1001 11/14/2015 1010  NA 140 141  K 4.2 4.1  CL 105 103  CO2 25  --   BUN 16 18  CREATININE 1.13 1.00  GLUCOSE 114* 113*    Electrolytes  Recent Labs Lab 11/11/2015 1001  CALCIUM 9.1    CBC  Recent Labs Lab 10/30/2015 1001 11/01/2015 1010  WBC 9.8  --   HGB 10.8* 12.2*  HCT 34.7* 36.0*  PLT 182  --     Coag's  Recent Labs Lab 11/13/2015 1001  APTT 33  INR 1.01    Sepsis Markers No results for input(s): LATICACIDVEN, PROCALCITON, O2SATVEN in the last 168 hours.  ABG No results for input(s): PHART, PCO2ART, PO2ART in the last 168 hours.  Liver Enzymes  Recent Labs Lab 11/26/2015 1001  AST 21  ALT 10*  ALKPHOS 101  BILITOT 0.4  ALBUMIN 3.3*    Cardiac Enzymes No results for input(s): TROPONINI, PROBNP in the last 168 hours.  Glucose  Recent Labs Lab 11/26/2015 1015  GLUCAP 103*    Imaging Ct Angio Head W/cm &/or Wo Cm  11/23/2015  CLINICAL DATA:  Stroke.  Right-sided weakness.  Lift gaze deviation. EXAM: CT ANGIOGRAPHY HEAD AND NECK TECHNIQUE: Multidetector CT imaging of the head and neck was performed using the standard protocol during bolus administration of intravenous contrast. Multiplanar CT image reconstructions and MIPs were obtained to evaluate  the vascular anatomy. Carotid stenosis measurements (when applicable) are obtained utilizing NASCET criteria, using the distal internal carotid diameter as the denominator. CONTRAST:  39mL OMNIPAQUE IOHEXOL 350 MG/ML SOLN COMPARISON:  CT head without contrast same day. FINDINGS: CT HEAD Brain: The source images demonstrated developing large posterior left MCA territory infarct with the symmetry of cortical density. Is asymmetric attenuation of the left lentiform nucleus is well. No acute hemorrhage is present. Diffuse periventricular and subcortical white matter changes are present bilaterally. Calvarium and skull base: Within normal limits Paranasal sinuses: Clear Orbits: Bilateral lens replacements are noted. CTA NECK Aortic arch: A 3 vessel arch configuration is present. Atherosclerotic calcifications are present at the aortic arch without stenosis or aneurysm. Right carotid system: The right common carotid artery in is within normal limits. Dense atherosclerotic calcifications are present at the carotid bifurcation. The lumen is narrowed to 1.6 mm. This compares with a more distal vessel of 4.8 mm. Mild tortuosity  is present. Atherosclerotic calcifications are present just proximal to the skullbase without significant stenosis. Left carotid system: Atherosclerotic calcifications are present in the distal left common carotid artery. Additional calcifications are present at the carotid bifurcation. There is no significant focal stenosis. There is mild tortuosity of the cervical internal carotid artery with distal calcifications and mild wall irregularity. Vertebral arteries:The vertebral arteries originate from the subclavian arteries bilaterally. There is moderate proximal tortuosity of both vessels without a significant stenosis. The right vertebral artery is the dominant vessel. Focal calcification is present at the C2-3 level on the left. Additional calcifications are present in both vertebral arteries at the  C1 level. There is no significant stenosis on either side. Skeleton: Grade 1 retrolisthesis is present at C3-4 and C4-5. There is chronic loss of disc height throughout the cervical spine, most evident at C6-7. Slight anterolisthesis is present at C7-T1. No focal lytic or blastic lesions are present. Other neck: No focal mucosal or submucosal lesions are present. The thyroid is within normal limits. No significant adenopathy is present. Bilateral pleural effusions are present, right greater than left. There is mild dependent atelectasis associated. Emphysema is noted. CTA HEAD Anterior circulation: Atherosclerotic changes are present throughout the cavernous internal carotid arteries bilaterally. The A1 segments are within normal limits bilaterally. The anterior communicating artery is patent. The right MCA is within normal limits. The bifurcation is intact. There is some attenuation of anterior MCA branch vessels distally. The left MCA is occluded at the bifurcation. Anterior branch vessels are opacified. There is no opacification of posterior branch vessels. No significant collaterals are present. Posterior circulation: The left vertebral artery demonstrates a less than 50% stenosis distal to the dural margin. The right vertebral artery is intact. The left PICA origin is visualized and normal. The right AICA is dominant. The basilar artery is within normal limits. Both posterior cerebral arteries originate from the basilar tip. The PCA branch vessels demonstrate mild distal attenuation. Venous sinuses: The dural sinuses are patent. The transverse sinuses are codominant. The straight sinus and deep cerebral veins are intact. The cortical veins are within normal limits. There is no significant drainage over the posterior left MCA territory. Anatomic variants: None Delayed phase: Not performed IMPRESSION: 1. Occlusion of the left middle cerebral artery at the bifurcation. No posterior branch vessels fill. There are  poor collaterals. 2. Minimal opacification of anterior left MCA branch vessels. 3. Large posterior left MCA territory developing infarct without hemorrhage. 4. Mild attenuation of anterior MCA branch vessels on the right. 5. Atherosclerotic calcifications at the aortic arch in carotid bifurcations bilaterally is well is the cavernous internal carotid arteries without significant stenoses. 6. Less than 50% stenosis of the left vertebral artery at the dural margin. Other atherosclerotic changes are present in the vertebral arteries bilaterally without significant stenoses. 7. Multilevel spondylosis of the cervical spine. 8. Emphysema and bilateral pleural effusions, right greater than left with associated atelectasis. These results were called by telephone at the time of interpretation on 11/13/2015 at 11:43 am to Dr. Audria Nine , who verbally acknowledged these results. Electronically Signed   By: San Morelle M.D.   On: 11/17/2015 12:06   Ct Head Wo Contrast  11/03/2015  CLINICAL DATA:  Status post cerebral angiogram and treatment of left MCA embolus today. Postprocedural examination. EXAM: CT HEAD WITHOUT CONTRAST TECHNIQUE: Contiguous axial images were obtained from the base of the skull through the vertex without intravenous contrast. COMPARISON:  Head CT scan 11/01/2015. FINDINGS: There is extensive  contrast staining in the left parietal and temporal lobes. An ovoid collection of high attenuation is seen in the anterior left temporal lobe measuring approximately 3.6 cm AP x 1.5 cm transverse. This may represent more intense and focal contrast staining but there may be coexistent hemorrhage in this location given its unusual can't figure. Hypoattenuation is seen in the left MCA territory and much more conspicuous than on the prior examination consistent with evolution of infarct. The brain is atrophic with chronic microvascular ischemic change. IMPRESSION: Extensive contrast staining in the left  temporal and parietal lobes is consistent with reperfusion after interventional procedure. Ovoid collection of contrast the anterior left temporal lobe may represent more intense contrast staining but coexistent hemorrhage is possible. Evolution of left MCA territory infarct. Atrophy and chronic microvascular ischemic change. Electronically Signed   By: Inge Rise M.D.   On: 11/10/2015 14:37   Ct Head Wo Contrast  11/21/2015  CLINICAL DATA:  Code stroke.  Right-sided weakness. EXAM: CT HEAD WITHOUT CONTRAST TECHNIQUE: Contiguous axial images were obtained from the base of the skull through the vertex without intravenous contrast. COMPARISON:  09/17/2015 FINDINGS: There is no evidence of mass effect, midline shift, or extra-axial fluid collections. There is no evidence of a space-occupying lesion or intracranial hemorrhage. There is no evidence of a cortical-based area of acute infarction. There is generalized cerebral atrophy. There is periventricular white matter low attenuation likely secondary to microangiopathy. The ventricles and sulci are appropriate for the patient's age. The basal cisterns are patent. Visualized portions of the orbits are unremarkable. The visualized portions of the paranasal sinuses and mastoid air cells are unremarkable. Cerebrovascular atherosclerotic calcifications are noted. The osseous structures are unremarkable. IMPRESSION: 1. No acute intracranial pathology. 2. Chronic microvascular disease and cerebral atrophy. These results were called by telephone at the time of interpretation on 11/18/2015 at 10:16 am to Dr. Silverio Decamp, who verbally acknowledged these results. Electronically Signed   By: Kathreen Devoid   On: 11/21/2015 10:23   Ct Angio Neck W/cm &/or Wo/cm  11/08/2015  CLINICAL DATA:  Stroke.  Right-sided weakness.  Lift gaze deviation. EXAM: CT ANGIOGRAPHY HEAD AND NECK TECHNIQUE: Multidetector CT imaging of the head and neck was performed using the standard protocol  during bolus administration of intravenous contrast. Multiplanar CT image reconstructions and MIPs were obtained to evaluate the vascular anatomy. Carotid stenosis measurements (when applicable) are obtained utilizing NASCET criteria, using the distal internal carotid diameter as the denominator. CONTRAST:  73mL OMNIPAQUE IOHEXOL 350 MG/ML SOLN COMPARISON:  CT head without contrast same day. FINDINGS: CT HEAD Brain: The source images demonstrated developing large posterior left MCA territory infarct with the symmetry of cortical density. Is asymmetric attenuation of the left lentiform nucleus is well. No acute hemorrhage is present. Diffuse periventricular and subcortical white matter changes are present bilaterally. Calvarium and skull base: Within normal limits Paranasal sinuses: Clear Orbits: Bilateral lens replacements are noted. CTA NECK Aortic arch: A 3 vessel arch configuration is present. Atherosclerotic calcifications are present at the aortic arch without stenosis or aneurysm. Right carotid system: The right common carotid artery in is within normal limits. Dense atherosclerotic calcifications are present at the carotid bifurcation. The lumen is narrowed to 1.6 mm. This compares with a more distal vessel of 4.8 mm. Mild tortuosity is present. Atherosclerotic calcifications are present just proximal to the skullbase without significant stenosis. Left carotid system: Atherosclerotic calcifications are present in the distal left common carotid artery. Additional calcifications are present at the carotid  bifurcation. There is no significant focal stenosis. There is mild tortuosity of the cervical internal carotid artery with distal calcifications and mild wall irregularity. Vertebral arteries:The vertebral arteries originate from the subclavian arteries bilaterally. There is moderate proximal tortuosity of both vessels without a significant stenosis. The right vertebral artery is the dominant vessel. Focal  calcification is present at the C2-3 level on the left. Additional calcifications are present in both vertebral arteries at the C1 level. There is no significant stenosis on either side. Skeleton: Grade 1 retrolisthesis is present at C3-4 and C4-5. There is chronic loss of disc height throughout the cervical spine, most evident at C6-7. Slight anterolisthesis is present at C7-T1. No focal lytic or blastic lesions are present. Other neck: No focal mucosal or submucosal lesions are present. The thyroid is within normal limits. No significant adenopathy is present. Bilateral pleural effusions are present, right greater than left. There is mild dependent atelectasis associated. Emphysema is noted. CTA HEAD Anterior circulation: Atherosclerotic changes are present throughout the cavernous internal carotid arteries bilaterally. The A1 segments are within normal limits bilaterally. The anterior communicating artery is patent. The right MCA is within normal limits. The bifurcation is intact. There is some attenuation of anterior MCA branch vessels distally. The left MCA is occluded at the bifurcation. Anterior branch vessels are opacified. There is no opacification of posterior branch vessels. No significant collaterals are present. Posterior circulation: The left vertebral artery demonstrates a less than 50% stenosis distal to the dural margin. The right vertebral artery is intact. The left PICA origin is visualized and normal. The right AICA is dominant. The basilar artery is within normal limits. Both posterior cerebral arteries originate from the basilar tip. The PCA branch vessels demonstrate mild distal attenuation. Venous sinuses: The dural sinuses are patent. The transverse sinuses are codominant. The straight sinus and deep cerebral veins are intact. The cortical veins are within normal limits. There is no significant drainage over the posterior left MCA territory. Anatomic variants: None Delayed phase: Not  performed IMPRESSION: 1. Occlusion of the left middle cerebral artery at the bifurcation. No posterior branch vessels fill. There are poor collaterals. 2. Minimal opacification of anterior left MCA branch vessels. 3. Large posterior left MCA territory developing infarct without hemorrhage. 4. Mild attenuation of anterior MCA branch vessels on the right. 5. Atherosclerotic calcifications at the aortic arch in carotid bifurcations bilaterally is well is the cavernous internal carotid arteries without significant stenoses. 6. Less than 50% stenosis of the left vertebral artery at the dural margin. Other atherosclerotic changes are present in the vertebral arteries bilaterally without significant stenoses. 7. Multilevel spondylosis of the cervical spine. 8. Emphysema and bilateral pleural effusions, right greater than left with associated atelectasis. These results were called by telephone at the time of interpretation on 11/03/2015 at 11:43 am to Dr. Audria Nine , who verbally acknowledged these results. Electronically Signed   By: San Morelle M.D.   On: 11/19/2015 12:06     STUDIES:  1/27  CT Head >> no acute abnormality  1/27  CTA Head / Neck >> occlusion of L MCA 1/27  CT Head >> extensive contrast staining in L temporal & parietal lobes consistent with reperfusion after IR procedure, ovoid collection of contrast in anterior L temporal lobe, ? Of intense contrast staining vs coexistent hemorrhage, evolution of L MCA territory infarct   CULTURES: 1/27  UA >> TNTC wbc, many bacteria, 0-5 sq epithelials 1/27  UC >>   ANTIBIOTICS: Levaquin 1/27 (UTI) >>  SIGNIFICANT EVENTS: 1/27  Admit with AMS, aphasia, R sided weakness.    LINES/TUBES: OETT 1/27 >> R Fem Sheath 1/27 >>   DISCUSSION: 82 y/o M, SNF resident, admitted on 1/27 with altered mental status, aphasia and right sided weakness.  Work up consistent with MCA infarct, received TPA and patient taken to neuro-IR for  revascularization.    ASSESSMENT / PLAN:  NEUROLOGIC A: MCA Infarct / Hemorrhage - s/p TPA + Neuro IR revascularization  Hx Arthritis, Frequent Falls, Chronic Low Back Pain, anxiety, dementia  P:   RASS goal: -2 Propofol for sedation  PRN Fentanyl for pain  Repeat imaging per Neurology - expect repeat CT in am 1/28 to review area of concern for contrast vs hemorrhage Serial neuro exams Hold home klonopin, namenda, effexor  PULMONARY A: Acute Respiratory Failure secondary to MCA CVA Hx OSA, COPD  P:   MV support, 8 cc/kg  Wean PEEP / FiO2 for sats 90-95% Duoneb Q6 with Q3 PRN albuterol  Daily SBT / WUA when OK per Neuro Repeat ABG now Assess CXR for ETT placement   CARDIOVASCULAR A:  Hx CAD, HTN, HLD, RBBB P:  Cardene gtt for SBP 110-130 Hold Pletal, toprolol XL ICU monitoring of hemodynamics May need central line once out of TPA window  Assess ECHO   RENAL A:   No acute Issues  P:   Trend CBC / UOP  Replace electrolytes as indicated  Hold home oxybutynin NS @ 62ml/hr  GASTROINTESTINAL A:   At Risk Protein Calorie Malnutrition  P:   NPO  OGT  Consider TF in am 1/27   HEMATOLOGIC A:   At Risk Bleeding s/p TPA  Anemia  P:  Trend CBC  SCD's for DVT prophylaxis   INFECTIOUS A:   UTI  P:   ABX and cultures as above Monitor fever curve / WBC  ENDOCRINE A:   Hypothyroidism - ? Hx of, noted in hx but not on medications according to med rec P:   Assess TSH    FAMILY  - Updates: No family available at time of assessment.    - Inter-disciplinary family meet or Palliative Care meeting due by:  2/4   Noe Gens, NP-C [facility] Pgr: [phone] or if no answer [phone] 11/02/2015, 3:04 PM   STAFF NOTE: I, Merrie Roof, MD FACP have personally reviewed patient's available data, including medical history, events of note, physical examination and test results as part of my evaluation. I have discussed with resident/NP and  other care providers such as pharmacist, RN and RRT. In addition, I personally evaluated patient and elicited key findings of: arrievd from IR, lungs clear, multiple bruises on arms, sedation on going, CT reviewed, likley to not have a good outcome from this, BP control initially with cardene but MAP dropping, dc prop, dc Cardene, re assess BP, may need neo, rate slowly rising would use cardize and NOT cardene of course for rate control, obtain tsh, consider further iaging, get abg sta on current MV, obtain pcxr for ett placement, need to discuss code status asap, scd, ssi, ppi , hold feeds, consider comfort care The patient is critically ill with multiple organ systems failure and requires high complexity decision making for assessment and support, frequent evaluation and titration of therapies, application of advanced monitoring technologies and extensive interpretation of multiple databases.   Critical Care Time devoted to patient care services described in this note is 35 Minutes. This time reflects time of care of  this signee: Merrie Roof, MD FACP. This critical care time does not reflect procedure time, or teaching time or supervisory time of PA/NP/Med student/Med Resident etc but could involve care discussion time. Rest per NP/medical resident whose note is outlined above and that I agree with   Lavon Paganini. Titus Mould, MD, [place] Pgr: [facility] 11/05/2015 5:05 PM

## 2015-11-25 NOTE — Code Documentation (Signed)
82yo male arriving to [facility] via [place] at [phone].  Patient from nursing facility where he was LKW at 0700 when he was witnessed to be walking with his walker and at his baseline by staff per EMS.  Patient with right sided flaccid, left gaze and nonverbal per EMS.  Code stroke activated by EMS.  Labs drawn and EDP cleared patient for CT.  Stroke team to the bedside.  Patient to CT.  Patient transported to A6.  NIHSS 22, see documentation for details and code stroke times.  Patient with fixed left gaze, right hemiplegia, and global aphasia.  Decision to treat with tPA made at 1018 and pharmacist at the bedside to mix.  PIV x 2 placed requiring multiple attempts.  Foley catheter placed.  Labetalol 5mg  IVP given x2.  Patient's BP within tPA parameters.  7mg  tPA bolus given at 1028 over 1 minute followed by 66mg /hr for a total of 73mg  per pharmacy dosing.  Patient to CTA per MD.  CTA showing left MCA occlusion.  IR notified for endovascular intervention. Patient's daughter at the bedside and updated on plan of care.  Patient back to A6.  Patient monitored frequently per post-tPA parameters.  Patient transferred to IR with IR team, bedside report provided.

## 2015-11-25 NOTE — Therapy (Signed)
SPEECH PATHOLOGY CANCELLED VISIT  Orders received for BSE/SLE due to stroke. Pt currently intubated. ST to continue efforts. RN notified.  Parminder Trapani B. Quentin Ore [facility], [place] [phone]

## 2015-11-25 NOTE — Progress Notes (Signed)
eLink Physician-Brief Progress Note Patient Name: Logan Allen DOB: 1923-11-03 MRN: [id number]   Date of Service  11/02/2015  HPI/Events of Note  Hypotension - BP = 67/47. Nicardipine IV infusion now off.   eICU Interventions  Will start Phenylephrine IV infusion back. Titrate to SBP 110-130.     Intervention Category Intermediate Interventions: Hypotension - evaluation and management  Konstantinos Cordoba Eugene 11/27/2015, 5:04 PM

## 2015-11-25 NOTE — ED Provider Notes (Signed)
CSN: [id number]     Arrival date & time 11/09/2015  1000 History   First MD Initiated Contact with Patient 11/28/2015 1000     Chief Complaint  Patient presents with  . Code Stroke    @EDPCLEARED @ (Consider location/radiation/quality/duration/timing/severity/associated sxs/prior Treatment) HPI At baseline patient is alert and oriented with normal mental status and ambulatory. A she lives in a nursing home facility. He was last seen normal by nursing staff at 7 AM. Prior to arrival he was discovered to have significant mental status change and weakness. He was not moving his right side. History is obtained through EMS. There is no report of preceding illness. Past Medical History  Diagnosis Date  . Coronary artery disease   . Hypertension   . High cholesterol   . COPD (chronic obstructive pulmonary disease) ([place])   . Exertional dyspnea 08/20/2012  . History of blood transfusion 1994; 2004    "w/both bypass ORs"  . History of carotid endarterectomy 1994    right  . Recurrent falls     "last fall 08/15/2012" (08/20/2012)  . Arthritis     "I suffer all over my body; dr said it could be from arthritis" (08/20/2012)  . Chronic lower back pain   . Anxiety   . Mental disorder   . Prostate cancer [facility]) 1988    radiation treatment  . Hypothyroidism   . OSA (obstructive sleep apnea)   . Gout     "thumb" (08/20/2012)  . Incontinence of urine    Past Surgical History  Procedure Laterality Date  . Appendectomy    . Cataract extraction w/ intraocular lens  implant, bilateral    . Coronary artery bypass graft  1994; 2004  . Prostatectomy    . Hernia repair      UHR  . Cardiac valve replacement  2004    AVR  . Orchiectomy  2001    bilateral  . Penile prosthesis  removal  2001  . Circumcision  2001   Family History  Problem Relation Age of Onset  . Hypertension Father   . Depression Father    Social History  Substance Use Topics  . Smoking status: Former Smoker -- 2.50 packs/day  for 55 years    Types: Cigarettes  . Smokeless tobacco: Never Used     Comment: 08/20/2012 "stopped smoking cigarettes ~ 1994"  . Alcohol Use: No    Review of Systems  Cannot perform review of systems due to patient condition level V caveat.  Allergies  Percocet; Zocor; and Penicillins  Home Medications   Prior to Admission medications   Medication Sig Start Date End Date Taking? Authorizing Provider  allopurinol (ZYLOPRIM) 100 MG tablet Take 100 mg by mouth daily.     Historical Provider, MD  aspirin EC 81 MG tablet Take 81 mg by mouth daily.    Historical Provider, MD  bimatoprost (LUMIGAN) 0.01 % SOLN Place 1 drop into both eyes at bedtime.    Historical Provider, MD  cilostazol (PLETAL) 50 MG tablet Take 50 mg by mouth 2 (two) times daily.    Historical Provider, MD  clonazePAM (KLONOPIN) 2 MG tablet Take 2 mg by mouth at bedtime.    Historical Provider, MD  Cyanocobalamin (VITAMIN B 12 PO) Take 1,000 mcg by mouth daily.    Historical Provider, MD  finasteride (PROSCAR) 5 MG tablet Take 5 mg by mouth every morning.  02/04/15   Historical Provider, MD  HYDROcodone-acetaminophen (NORCO/VICODIN) 5-325 MG tablet Take 1 tablet by  mouth every 12 (twelve) hours as needed for moderate pain.    Historical Provider, MD  memantine (NAMENDA) 10 MG tablet Take 10 mg by mouth 2 (two) times daily.    Historical Provider, MD  metoprolol succinate (TOPROL-XL) 25 MG 24 hr tablet Take 25 mg by mouth daily.    Historical Provider, MD  Mometasone Furoate [facility] HFA) 100 MCG/ACT AERO Inhale 2 puffs into the lungs 2 (two) times daily as needed (shortness of breathe).    Historical Provider, MD  ondansetron (ZOFRAN ODT) 4 MG disintegrating tablet 4mg  ODT q4 hours prn nausea/vomit 10/18/15   Deno Etienne, DO  oxybutynin (DITROPAN) 5 MG tablet Take 5 mg by mouth daily.    Historical Provider, MD  polyvinyl alcohol (LIQUIFILM TEARS) 1.4 % ophthalmic solution Place 1 drop into both eyes 2 (two) times daily as  needed for dry eyes. For 6 days    Historical Provider, MD   BP 186/88 mmHg  Pulse 79  Resp 14  Wt 178 lb 9.2 oz (81 kg)  SpO2 94% Physical Exam  Constitutional:  Patient has a gaze deviation to the left. He does not have acute respiratory distress. Limited in capacity to respond.  HENT:  Head: Normocephalic and atraumatic.  Nose: Nose normal.  Mouth/Throat: Oropharynx is clear and moist.  Eyes:  Gaze deviation present.pupils are round and symmetric.  Cardiovascular: Normal rate, regular rhythm, normal heart sounds and intact distal pulses.   Pulmonary/Chest: Effort normal and breath sounds normal.  Abdominal: Soft. He exhibits no distension.  Musculoskeletal:  No significant peripheral edema. No extremity deformity  Neurological:  Patient is awake. He is able to perform grip on the left side but no grip response on the right. Slight movement of the left lower extremity. No movement of the right.  Skin: Skin is warm and dry.    ED Course  Procedures (including critical care time) CRITICAL CARE Performed by: Charlesetta Shanks   Total critical care time: 30 minutes  Critical care time was exclusive of separately billable procedures and treating other patients.  Critical care was necessary to treat or prevent imminent or life-threatening deterioration.  Critical care was time spent personally by me on the following activities: development of treatment plan with patient and/or surrogate as well as nursing, discussions with consultants, evaluation of patient's response to treatment, examination of patient, obtaining history from patient or surrogate, ordering and performing treatments and interventions, ordering and review of laboratory studies, ordering and review of radiographic studies, pulse oximetry and re-evaluation of patient's condition. Labs Review Labs Reviewed  CBC - Abnormal; Notable for the following:    Hemoglobin 10.8 (*)    HCT 34.7 (*)    MCH 24.5 (*)    All  other components within normal limits  COMPREHENSIVE METABOLIC PANEL - Abnormal; Notable for the following:    Glucose, Bld 114 (*)    Albumin 3.3 (*)    ALT 10 (*)    GFR calc non Af Amer 56 (*)    All other components within normal limits  URINALYSIS, ROUTINE W REFLEX MICROSCOPIC (NOT AT [facility]) - Abnormal; Notable for the following:    APPearance CLOUDY (*)    Leukocytes, UA MODERATE (*)    All other components within normal limits  URINE MICROSCOPIC-ADD ON - Abnormal; Notable for the following:    Squamous Epithelial / LPF 0-5 (*)    Bacteria, UA MANY (*)    All other components within normal limits  CBG MONITORING, ED -  Abnormal; Notable for the following:    Glucose-Capillary 103 (*)    All other components within normal limits  I-STAT CHEM 8, ED - Abnormal; Notable for the following:    Glucose, Bld 113 (*)    Calcium, Ion 1.11 (*)    Hemoglobin 12.2 (*)    HCT 36.0 (*)    All other components within normal limits  PROTIME-INR  APTT  DIFFERENTIAL  I-STAT TROPOININ, ED    Imaging Review Ct Angio Head W/cm &/or Wo Cm  11/21/2015  CLINICAL DATA:  Stroke.  Right-sided weakness.  Lift gaze deviation. EXAM: CT ANGIOGRAPHY HEAD AND NECK TECHNIQUE: Multidetector CT imaging of the head and neck was performed using the standard protocol during bolus administration of intravenous contrast. Multiplanar CT image reconstructions and MIPs were obtained to evaluate the vascular anatomy. Carotid stenosis measurements (when applicable) are obtained utilizing NASCET criteria, using the distal internal carotid diameter as the denominator. CONTRAST:  37mL OMNIPAQUE IOHEXOL 350 MG/ML SOLN COMPARISON:  CT head without contrast same day. FINDINGS: CT HEAD Brain: The source images demonstrated developing large posterior left MCA territory infarct with the symmetry of cortical density. Is asymmetric attenuation of the left lentiform nucleus is well. No acute hemorrhage is present. Diffuse periventricular  and subcortical white matter changes are present bilaterally. Calvarium and skull base: Within normal limits Paranasal sinuses: Clear Orbits: Bilateral lens replacements are noted. CTA NECK Aortic arch: A 3 vessel arch configuration is present. Atherosclerotic calcifications are present at the aortic arch without stenosis or aneurysm. Right carotid system: The right common carotid artery in is within normal limits. Dense atherosclerotic calcifications are present at the carotid bifurcation. The lumen is narrowed to 1.6 mm. This compares with a more distal vessel of 4.8 mm. Mild tortuosity is present. Atherosclerotic calcifications are present just proximal to the skullbase without significant stenosis. Left carotid system: Atherosclerotic calcifications are present in the distal left common carotid artery. Additional calcifications are present at the carotid bifurcation. There is no significant focal stenosis. There is mild tortuosity of the cervical internal carotid artery with distal calcifications and mild wall irregularity. Vertebral arteries:The vertebral arteries originate from the subclavian arteries bilaterally. There is moderate proximal tortuosity of both vessels without a significant stenosis. The right vertebral artery is the dominant vessel. Focal calcification is present at the C2-3 level on the left. Additional calcifications are present in both vertebral arteries at the C1 level. There is no significant stenosis on either side. Skeleton: Grade 1 retrolisthesis is present at C3-4 and C4-5. There is chronic loss of disc height throughout the cervical spine, most evident at C6-7. Slight anterolisthesis is present at C7-T1. No focal lytic or blastic lesions are present. Other neck: No focal mucosal or submucosal lesions are present. The thyroid is within normal limits. No significant adenopathy is present. Bilateral pleural effusions are present, right greater than left. There is mild dependent  atelectasis associated. Emphysema is noted. CTA HEAD Anterior circulation: Atherosclerotic changes are present throughout the cavernous internal carotid arteries bilaterally. The A1 segments are within normal limits bilaterally. The anterior communicating artery is patent. The right MCA is within normal limits. The bifurcation is intact. There is some attenuation of anterior MCA branch vessels distally. The left MCA is occluded at the bifurcation. Anterior branch vessels are opacified. There is no opacification of posterior branch vessels. No significant collaterals are present. Posterior circulation: The left vertebral artery demonstrates a less than 50% stenosis distal to the dural margin. The right vertebral artery  is intact. The left PICA origin is visualized and normal. The right AICA is dominant. The basilar artery is within normal limits. Both posterior cerebral arteries originate from the basilar tip. The PCA branch vessels demonstrate mild distal attenuation. Venous sinuses: The dural sinuses are patent. The transverse sinuses are codominant. The straight sinus and deep cerebral veins are intact. The cortical veins are within normal limits. There is no significant drainage over the posterior left MCA territory. Anatomic variants: None Delayed phase: Not performed IMPRESSION: 1. Occlusion of the left middle cerebral artery at the bifurcation. No posterior branch vessels fill. There are poor collaterals. 2. Minimal opacification of anterior left MCA branch vessels. 3. Large posterior left MCA territory developing infarct without hemorrhage. 4. Mild attenuation of anterior MCA branch vessels on the right. 5. Atherosclerotic calcifications at the aortic arch in carotid bifurcations bilaterally is well is the cavernous internal carotid arteries without significant stenoses. 6. Less than 50% stenosis of the left vertebral artery at the dural margin. Other atherosclerotic changes are present in the vertebral  arteries bilaterally without significant stenoses. 7. Multilevel spondylosis of the cervical spine. 8. Emphysema and bilateral pleural effusions, right greater than left with associated atelectasis. These results were called by telephone at the time of interpretation on 11/11/2015 at 11:43 am to Dr. Audria Nine , who verbally acknowledged these results. Electronically Signed   By: San Morelle M.D.   On: 11/13/2015 12:06   Ct Head Wo Contrast  11/14/2015  CLINICAL DATA:  Code stroke.  Right-sided weakness. EXAM: CT HEAD WITHOUT CONTRAST TECHNIQUE: Contiguous axial images were obtained from the base of the skull through the vertex without intravenous contrast. COMPARISON:  09/17/2015 FINDINGS: There is no evidence of mass effect, midline shift, or extra-axial fluid collections. There is no evidence of a space-occupying lesion or intracranial hemorrhage. There is no evidence of a cortical-based area of acute infarction. There is generalized cerebral atrophy. There is periventricular white matter low attenuation likely secondary to microangiopathy. The ventricles and sulci are appropriate for the patient's age. The basal cisterns are patent. Visualized portions of the orbits are unremarkable. The visualized portions of the paranasal sinuses and mastoid air cells are unremarkable. Cerebrovascular atherosclerotic calcifications are noted. The osseous structures are unremarkable. IMPRESSION: 1. No acute intracranial pathology. 2. Chronic microvascular disease and cerebral atrophy. These results were called by telephone at the time of interpretation on 11/07/2015 at 10:16 am to Dr. Silverio Decamp, who verbally acknowledged these results. Electronically Signed   By: Kathreen Devoid   On: 11/05/2015 10:23   Ct Angio Neck W/cm &/or Wo/cm  11/13/2015  CLINICAL DATA:  Stroke.  Right-sided weakness.  Lift gaze deviation. EXAM: CT ANGIOGRAPHY HEAD AND NECK TECHNIQUE: Multidetector CT imaging of the head and neck was performed  using the standard protocol during bolus administration of intravenous contrast. Multiplanar CT image reconstructions and MIPs were obtained to evaluate the vascular anatomy. Carotid stenosis measurements (when applicable) are obtained utilizing NASCET criteria, using the distal internal carotid diameter as the denominator. CONTRAST:  13mL OMNIPAQUE IOHEXOL 350 MG/ML SOLN COMPARISON:  CT head without contrast same day. FINDINGS: CT HEAD Brain: The source images demonstrated developing large posterior left MCA territory infarct with the symmetry of cortical density. Is asymmetric attenuation of the left lentiform nucleus is well. No acute hemorrhage is present. Diffuse periventricular and subcortical white matter changes are present bilaterally. Calvarium and skull base: Within normal limits Paranasal sinuses: Clear Orbits: Bilateral lens replacements are noted. CTA NECK Aortic arch:  A 3 vessel arch configuration is present. Atherosclerotic calcifications are present at the aortic arch without stenosis or aneurysm. Right carotid system: The right common carotid artery in is within normal limits. Dense atherosclerotic calcifications are present at the carotid bifurcation. The lumen is narrowed to 1.6 mm. This compares with a more distal vessel of 4.8 mm. Mild tortuosity is present. Atherosclerotic calcifications are present just proximal to the skullbase without significant stenosis. Left carotid system: Atherosclerotic calcifications are present in the distal left common carotid artery. Additional calcifications are present at the carotid bifurcation. There is no significant focal stenosis. There is mild tortuosity of the cervical internal carotid artery with distal calcifications and mild wall irregularity. Vertebral arteries:The vertebral arteries originate from the subclavian arteries bilaterally. There is moderate proximal tortuosity of both vessels without a significant stenosis. The right vertebral artery is  the dominant vessel. Focal calcification is present at the C2-3 level on the left. Additional calcifications are present in both vertebral arteries at the C1 level. There is no significant stenosis on either side. Skeleton: Grade 1 retrolisthesis is present at C3-4 and C4-5. There is chronic loss of disc height throughout the cervical spine, most evident at C6-7. Slight anterolisthesis is present at C7-T1. No focal lytic or blastic lesions are present. Other neck: No focal mucosal or submucosal lesions are present. The thyroid is within normal limits. No significant adenopathy is present. Bilateral pleural effusions are present, right greater than left. There is mild dependent atelectasis associated. Emphysema is noted. CTA HEAD Anterior circulation: Atherosclerotic changes are present throughout the cavernous internal carotid arteries bilaterally. The A1 segments are within normal limits bilaterally. The anterior communicating artery is patent. The right MCA is within normal limits. The bifurcation is intact. There is some attenuation of anterior MCA branch vessels distally. The left MCA is occluded at the bifurcation. Anterior branch vessels are opacified. There is no opacification of posterior branch vessels. No significant collaterals are present. Posterior circulation: The left vertebral artery demonstrates a less than 50% stenosis distal to the dural margin. The right vertebral artery is intact. The left PICA origin is visualized and normal. The right AICA is dominant. The basilar artery is within normal limits. Both posterior cerebral arteries originate from the basilar tip. The PCA branch vessels demonstrate mild distal attenuation. Venous sinuses: The dural sinuses are patent. The transverse sinuses are codominant. The straight sinus and deep cerebral veins are intact. The cortical veins are within normal limits. There is no significant drainage over the posterior left MCA territory. Anatomic variants: None  Delayed phase: Not performed IMPRESSION: 1. Occlusion of the left middle cerebral artery at the bifurcation. No posterior branch vessels fill. There are poor collaterals. 2. Minimal opacification of anterior left MCA branch vessels. 3. Large posterior left MCA territory developing infarct without hemorrhage. 4. Mild attenuation of anterior MCA branch vessels on the right. 5. Atherosclerotic calcifications at the aortic arch in carotid bifurcations bilaterally is well is the cavernous internal carotid arteries without significant stenoses. 6. Less than 50% stenosis of the left vertebral artery at the dural margin. Other atherosclerotic changes are present in the vertebral arteries bilaterally without significant stenoses. 7. Multilevel spondylosis of the cervical spine. 8. Emphysema and bilateral pleural effusions, right greater than left with associated atelectasis. These results were called by telephone at the time of interpretation on 11/14/2015 at 11:43 am to Dr. Audria Nine , who verbally acknowledged these results. Electronically Signed   By: San Morelle M.D.   On:  11/22/2015 12:06   I have personally reviewed and evaluated these images and lab results as part of my medical decision-making.   EKG Interpretation   Date/Time:  Friday November 25 2015 10:11:23 EST Ventricular Rate:  86 PR Interval:    QRS Duration: 139 QT Interval:  391 QTC Calculation: 468 R Axis:   -23 Text Interpretation:  Atrial fibrillation Right bundle branch block agree.  Old right bundle branch block Confirmed by Johnney Killian, MD, Jeannie Done [id number]) on  11/16/2015 12:11:53 PM      MDM   Final diagnoses:  Stroke [facility])  Cerebrovascular accident (CVA) due to thrombosis of cerebral artery ([place])  Cerebral infarction due to occlusion of left middle cerebral artery ([place])  Cerebral infarction due to occlusion of left middle cerebral artery ([place])   Patient is evaluated as a code stroke at EMS arrival.He presents with  clinically evident stroke. Patient is also seen with Dr. Silverio Decamp for code stroke. He has decided to treat with TPA and subsequent interventional radiology for MCA occlusion. airway is a is intact. Blood pressures are stable in the 170s /80s. At this time there is no medical history to suggest acute infection or other acute medical illness. UA does show incidental urinary tract infection.    Charlesetta Shanks, MD 11/17/2015 [phone]

## 2015-11-25 NOTE — Consult Note (Deleted)
Requesting Physician: Dr.  Vallery Ridge    Reason for admission: Acute left MCA stroke  HPI:                                                                                                                                         Logan Allen is an 82 y.o. male patient who was brought in by the EMTs with altered mental status, acute onset of right sided weakness and left gaze deviation. Patient lives in an assisted living facility. Last seen normal was at 7 AM when he was noted to be walking with a walker.  The facility staff found him with the right-sided weakness past 9 AM. After that, EMTs were called in and was transported to the ER. Patient's family was not immediately available to initial evaluation but her daughter came into the ER about 30 minutes later. Her daughter reported that patient was diagnosed with atrial fibrillation at least 1-2 years ago but hasn't been on any anticoagulation. He is just on aspirin daily. No history of prior intracerebral hemorrhage, or any recent bleeding or surgeries or head trauma.   Date last known well:  11/22/2015 Time last known well:  0700 tPA Given: Yes  Stroke Risk Factors - atrial fibrillation, hyperlipidemia and hypertension  Past Medical History: Past Medical History  Diagnosis Date  . Coronary artery disease   . Hypertension   . High cholesterol   . COPD (chronic obstructive pulmonary disease) ([place])   . Exertional dyspnea 08/20/2012  . History of blood transfusion 1994; 2004    "w/both bypass ORs"  . History of carotid endarterectomy 1994    right  . Recurrent falls     "last fall 08/15/2012" (08/20/2012)  . Arthritis     "I suffer all over my body; dr said it could be from arthritis" (08/20/2012)  . Chronic lower back pain   . Anxiety   . Mental disorder   . Prostate cancer [facility]) 1988    radiation treatment  . Hypothyroidism   . OSA (obstructive sleep apnea)   . Gout     "thumb" (08/20/2012)  . Incontinence of urine     Past  Surgical History  Procedure Laterality Date  . Appendectomy    . Cataract extraction w/ intraocular lens  implant, bilateral    . Coronary artery bypass graft  1994; 2004  . Prostatectomy    . Hernia repair      UHR  . Cardiac valve replacement  2004    AVR  . Orchiectomy  2001    bilateral  . Penile prosthesis  removal  2001  . Circumcision  2001    Family History: Family History  Problem Relation Age of Onset  . Hypertension Father   . Depression Father     Social History:   reports that he has quit smoking. His smoking use included Cigarettes. He has a  137.5 pack-year smoking history. He has never used smokeless tobacco. He reports that he does not drink alcohol or use illicit drugs.  Allergies:  Perocet, zocor, pcn.    Medications:                                                                                                                         Current facility-administered medications:  .  alteplase (ACTIVASE) 1 mg/mL infusion 73 mg, 0.9 mg/kg, Intravenous, Once, 73 mg at 11/15/2015 1106 **FOLLOWED BY** [COMPLETED] 0.9 %  sodium chloride infusion, 50 mL, Intravenous, Once, Elsworth Ledin Fuller Mandril, MD, Last Rate: 73 mL/hr at 11/28/2015 1131, 73 mL at 10/30/2015 1131 .  alteplase (ACTIVASE) 30mg /85mL-for IA use/Neuro-IR, 1-30 mg, Intra-arterial, to XRAY, Luanne Bras, MD .  niCARdipine in saline (CARDENE-IV) 20-0.86 MG/200ML-% infusion SOLN, , , ,  .  vancomycin (VANCOCIN) IVPB 1000 mg/200 mL premix, 1,000 mg, Intravenous, to XRAY, Luanne Bras, MD  Current outpatient prescriptions:  .  allopurinol (ZYLOPRIM) 100 MG tablet, Take 100 mg by mouth daily. , Disp: , Rfl:  .  aspirin EC 81 MG tablet, Take 81 mg by mouth daily., Disp: , Rfl:  .  bimatoprost (LUMIGAN) 0.01 % SOLN, Place 1 drop into both eyes at bedtime., Disp: , Rfl:  .  cilostazol (PLETAL) 50 MG tablet, Take 50 mg by mouth 2 (two) times daily., Disp: , Rfl:  .  clonazePAM (KLONOPIN) 2 MG tablet,  Take 2 mg by mouth at bedtime., Disp: , Rfl:  .  Cyanocobalamin (VITAMIN B 12 PO), Take 1,000 mcg by mouth daily., Disp: , Rfl:  .  finasteride (PROSCAR) 5 MG tablet, Take 5 mg by mouth every morning. , Disp: , Rfl: 8 .  HYDROcodone-acetaminophen (NORCO/VICODIN) 5-325 MG tablet, Take 1 tablet by mouth every 12 (twelve) hours as needed for moderate pain., Disp: , Rfl:  .  memantine (NAMENDA) 10 MG tablet, Take 10 mg by mouth 2 (two) times daily., Disp: , Rfl:  .  metoprolol succinate (TOPROL-XL) 25 MG 24 hr tablet, Take 25 mg by mouth daily., Disp: , Rfl:  .  Mometasone Furoate (ASMANEX HFA) 100 MCG/ACT AERO, Inhale 2 puffs into the lungs 2 (two) times daily as needed (shortness of breathe)., Disp: , Rfl:  .  ondansetron (ZOFRAN ODT) 4 MG disintegrating tablet, 4mg  ODT q4 hours prn nausea/vomit, Disp: 20 tablet, Rfl: 0 .  oxybutynin (DITROPAN) 5 MG tablet, Take 5 mg by mouth daily., Disp: , Rfl:  .  polyvinyl alcohol (LIQUIFILM TEARS) 1.4 % ophthalmic solution, Place 1 drop into both eyes 2 (two) times daily as needed for dry eyes. For 6 days, Disp: , Rfl:    ROS:  History    unobtainable from patient due to mental status    Neurologic Examination:                                                                                                      Blood pressure 186/88, pulse 79, resp. rate 14, weight 81 kg (178 lb 9.2 oz), SpO2 94 %. He had spontaneous eye opening, with left gaze deviation able to sustain antegrade strength in left upper and lower extremities complete hemi-neglect of the right side right hemiplegia. Right facial weakness noted. Pupils equal and reactive. No abnormal involuntary movements. Positive Babinski on the right.   NIHSS is 22.     Lab Results: Basic Metabolic Panel:  Recent Labs Lab 11/11/2015 1001 11/05/2015 1010  NA 140 141  K 4.2  4.1  CL 105 103  CO2 25  --   GLUCOSE 114* 113*  BUN 16 18  CREATININE 1.13 1.00  CALCIUM 9.1  --     Liver Function Tests:  Recent Labs Lab 10/31/2015 1001  AST 21  ALT 10*  ALKPHOS 101  BILITOT 0.4  PROT 6.6  ALBUMIN 3.3*   No results for input(s): LIPASE, AMYLASE in the last 168 hours. No results for input(s): AMMONIA in the last 168 hours.  CBC:  Recent Labs Lab 11/07/2015 1001 11/05/2015 1010  WBC 9.8  --   NEUTROABS 6.2  --   HGB 10.8* 12.2*  HCT 34.7* 36.0*  MCV 78.7  --   PLT 182  --     Cardiac Enzymes: No results for input(s): CKTOTAL, CKMB, CKMBINDEX, TROPONINI in the last 168 hours.  Lipid Panel: No results for input(s): CHOL, TRIG, HDL, CHOLHDL, VLDL, LDLCALC in the last 168 hours.  CBG:  Recent Labs Lab 11/14/2015 1015  GLUCAP 103*    Microbiology: Results for orders placed or performed during the hospital encounter of 08/20/12  Urine culture     Status: None   Collection Time: 08/21/12  9:40 AM  Result Value Ref Range Status   Specimen Description URINE, CLEAN CATCH  Final   Special Requests NONE  Final   Culture  Setup Time 08/21/2012 11:00  Final   Colony Count >=100,000 COLONIES/ML  Final   Culture ESCHERICHIA COLI  Final   Report Status 08/23/2012 FINAL  Final   Organism ID, Bacteria ESCHERICHIA COLI  Final      Susceptibility   Escherichia coli - MIC*    AMPICILLIN 8 Sensitive     CEFAZOLIN <=4 Sensitive     CEFTRIAXONE <=1 Sensitive     CIPROFLOXACIN <=0.25 Sensitive     GENTAMICIN <=1 Sensitive     LEVOFLOXACIN <=0.12 Sensitive     NITROFURANTOIN <=16 Sensitive     TOBRAMYCIN <=1 Sensitive     TRIMETH/SULFA <=20 Sensitive     PIP/TAZO <=4 Sensitive     * ESCHERICHIA COLI  Urine culture     Status: None   Collection Time: 08/26/12 11:54 AM  Result Value Ref Range Status   Specimen Description URINE, RANDOM  Final   Special Requests  NONE  Final   Culture  Setup Time 08/26/2012 13:56  Final   Colony Count NO GROWTH  Final    Culture NO GROWTH  Final   Report Status 08/27/2012 FINAL  Final     Imaging: Ct Head Wo Contrast  11/24/2015  CLINICAL DATA:  Code stroke.  Right-sided weakness. EXAM: CT HEAD WITHOUT CONTRAST TECHNIQUE: Contiguous axial images were obtained from the base of the skull through the vertex without intravenous contrast. COMPARISON:  09/17/2015 FINDINGS: There is no evidence of mass effect, midline shift, or extra-axial fluid collections. There is no evidence of a space-occupying lesion or intracranial hemorrhage. There is no evidence of a cortical-based area of acute infarction. There is generalized cerebral atrophy. There is periventricular white matter low attenuation likely secondary to microangiopathy. The ventricles and sulci are appropriate for the patient's age. The basal cisterns are patent. Visualized portions of the orbits are unremarkable. The visualized portions of the paranasal sinuses and mastoid air cells are unremarkable. Cerebrovascular atherosclerotic calcifications are noted. The osseous structures are unremarkable. IMPRESSION: 1. No acute intracranial pathology. 2. Chronic microvascular disease and cerebral atrophy. These results were called by telephone at the time of interpretation on 11/14/2015 at 10:16 am to Dr. Silverio Decamp, who verbally acknowledged these results. Electronically Signed   By: Kathreen Devoid   On: 11/26/2015 10:23    Assessment and plan:   JERMOND TRIGLIA is an 82 y.o. male patient who presented to the ER with clinical examination suggestive of a dense left MCA stroke. A stat CT of the head was done for stroke code protocol. No evidence of intracerebral hemorrhage is noted. He is within the time window for IV TPA, with no known contraindications. IV TPA was started in the ER, with documentation per stroke nursing notes.   Following the IV TPA initiation, a stat CT angiogram of the head and neck were obtained. It showed complete occlusion of the distal left M1. Dr.  Elroy Channel was consulted for acute thrombectomy. Patient was still within the window for thrombectomy. Discussed the current assessment, treatment options including thrombectomy for the left M1 occlusion with patient's daughter, discussed the risks, benefit and she agreed to proceed with the procedure.    Patient was taken to interventional radiology suite for the thrombectomy procedure. Complete recannulization of the left MCA was obtained. Patient was intubated and sedated with propofol for the procedure.  Following the procedure, he was admitted to the neuro ICU. Goal systolic blood pressure to remain between 110 to 130. Postprocedure CT of the head showed a small intraparenchymal hemorrhage in the left temporal location and some contrast enhancement due to reperfusion in the left parietotemporal areas.  Patient remains intubated, sedated on propofol. He has been having labile blood pressure in addition to tachycardia, being monitored closely by the critical care team.   Post-TPA orders have been placed. We will obtain MRI of the brain, echocardiogram tomorrow morning. He is on continuous cardiac telemetry and blood pressure monitoring. SCDs for DVT prophylaxis.  Stroke team will continue to follow-up signed tomorrow morning.

## 2015-11-25 NOTE — ED Notes (Addendum)
Pt. Transported to IR at this time. Magda Paganini RN notified to start cardene if systolic BP over [id number].

## 2015-11-25 NOTE — Progress Notes (Signed)
Report given at bedside to [facility]

## 2015-11-25 NOTE — Progress Notes (Signed)
CRITICAL VALUE ALERT  Critical value received:  hgb 6.4  Date of notification:  11/28/2015   Time of notification:  2145  Critical value read back: yes  Nurse who received alert: Denice Bors, RN  MD notified (1st page):  Diona Fanti, MD  Time of first page:  2145  MD notified (2nd page):  Time of second page:  Responding MD:  Diona Fanti  Time MD responded:  2145  MD at bedside and notified. Awaiting orders. RN will continue to monitor patient closely.

## 2015-11-25 NOTE — Progress Notes (Signed)
Pt has bruising noted to Right arm when BP cuff removed, bruising noted to RLE and Right thigh.  Babs Bertin RN

## 2015-11-25 NOTE — ED Notes (Signed)
Stroke team RN, rapid response RN, and MD at bedside.

## 2015-11-25 NOTE — Anesthesia Procedure Notes (Signed)
Procedure Name: Intubation Date/Time: 11/13/2015 11:26 AM Performed by: Julian Reil Pre-anesthesia Checklist: Patient identified, Emergency Drugs available, Suction available and Patient being monitored Patient Re-evaluated:Patient Re-evaluated prior to inductionOxygen Delivery Method: Circle system utilized Preoxygenation: Pre-oxygenation with 100% oxygen Intubation Type: IV induction and Rapid sequence Laryngoscope Size: Mac and 4 Grade View: Grade III Tube type: Subglottic suction tube Tube size: 8.0 mm Number of attempts: 2 Airway Equipment and Method: Bougie stylet Placement Confirmation: positive ETCO2,  ETT inserted through vocal cords under direct vision and breath sounds checked- equal and bilateral Secured at: 23 cm Tube secured with: Tape Dental Injury: Teeth and Oropharynx as per pre-operative assessment  Difficulty Due To: Difficulty was unanticipated, Difficult Airway- due to anterior larynx and Difficult Airway- due to reduced neck mobility Future Recommendations: Recommend- induction with short-acting agent, and alternative techniques readily available Comments: Patient to IR emergently. Rapid sequence induction, no attempt at mask ventilation. DL with MAC 4 by CRNA with view of epiglottis only, attempted without success to place bougie. DL by Dr. Ermalene Postin with MAC 4, bougie advanced without resistance and ETT passed over bougie. Atraumatic oral intubation.

## 2015-11-25 NOTE — ED Notes (Signed)
Gold watch given to daughter at this time.

## 2015-11-25 NOTE — Procedures (Signed)
S/P lt common carotid arteriogram,followed bycomplete revascularization of Lt MCA occlusion using x 2 passes with 41mm x 20 mm trevoprovue and x 1 pass with 4 mmx 40 mm solitaire FR device and 9 mg of superselective IA tpa with near complete revascularization . TICI 2b flow restored

## 2015-11-25 NOTE — ED Notes (Signed)
Pt. Coming from nursing facility with right-sided weakness and changes from baseline per nurse. Pt. Last seen normal 0700 by nurse and was ambulatory/talking/AOx4 at that time. Pt. Arrived non-verbal with right-sided deficits. Stroke team at bedside at this time. Please see stroke team notes.

## 2015-11-25 NOTE — Plan of Care (Signed)
Problem: Consults Goal: Vascular Cath Int Patient Education (See Patient Education module for education specifics.) Outcome: Completed/Met Date Met:  11/06/2015 Spoke with pt's Daughter

## 2015-11-25 NOTE — Progress Notes (Signed)
eLink Physician-Brief Progress Note Patient Name: Logan Allen DOB: Sep 14, 1924 MRN: [id number]   Date of Service  11/28/2015  HPI/Events of Note  Hgb drop from 12.2 to 6.4  eICU Interventions  Plan: T&S  Transfuse 2 units of pRBC Post-transfusion CBC Continue with NEO for BP support while receiving transfusion.     Intervention Category Intermediate Interventions: Other:  DETERDING,ELIZABETH 11/27/2015, 10:13 PM

## 2015-11-25 NOTE — Progress Notes (Addendum)
Anesthesia with 1 failed attempt to Left radial arterial line

## 2015-11-25 NOTE — Progress Notes (Signed)
eLink Physician-Brief Progress Note Patient Name: Logan Allen DOB: November 09, 1923 MRN: [id number]   Date of Service  11/06/2015  HPI/Events of Note  ABG showing metabolic acidosis with pH 7.23/33/79/14.  eICU Interventions  Plan: Increase vent rate from 14 to 20 Repeat ABG in AM     Intervention Category Major Interventions: Acid-Base disturbance - evaluation and management  DETERDING,ELIZABETH 11/27/2015, 11:18 PM

## 2015-11-25 NOTE — Transfer of Care (Signed)
Immediate Anesthesia Transfer of Care Note  Patient: Logan Allen  Procedure(s) Performed: Procedure(s): RADIOLOGY WITH ANESTHESIA (N/A)  Patient Location: ICU  Anesthesia Type:General  Level of Consciousness: sedated, unresponsive and Patient remains intubated per anesthesia plan  Airway & Oxygen Therapy: Patient remains intubated per anesthesia plan and Patient placed on Ventilator (see vital sign flow sheet for setting)  Post-op Assessment: Report given to RN and Post -op Vital signs reviewed and stable  Post vital signs: Reviewed and stable  Last Vitals:  Filed Vitals:   11/14/2015 1101 11/08/2015 1115  BP: 177/85 186/88  Pulse: 79   Resp: 17 14    Complications: No apparent anesthesia complications

## 2015-11-25 NOTE — ED Notes (Signed)
  CBG 103  

## 2015-11-25 NOTE — H&P (Signed)
Requesting Physician: Dr.  Vallery Ridge    Reason for admission: Acute left MCA stroke  HPI:                                                                                                                                         Logan Allen is an 82 y.o. male patient who was brought in by the EMTs with altered mental status, acute onset of right sided weakness and left gaze deviation. Patient lives in an assisted living facility. Last seen normal was at 7 AM when he was noted to be walking with a walker.  The facility staff found him with the right-sided weakness past 9 AM. After that, EMTs were called in and was transported to the ER. Patient's family was not immediately available to initial evaluation but her daughter came into the ER about 30 minutes later. Her daughter reported that patient was diagnosed with atrial fibrillation at least 1-2 years ago but hasn't been on any anticoagulation. He is just on aspirin daily. No history of prior intracerebral hemorrhage, or any recent bleeding or surgeries or head trauma.   Date last known well:  11/27/2015 Time last known well:  0700 tPA Given: Yes  Stroke Risk Factors - atrial fibrillation, hyperlipidemia and hypertension  Past Medical History: Past Medical History  Diagnosis Date  . Coronary artery disease   . Hypertension   . High cholesterol   . COPD (chronic obstructive pulmonary disease) ([place])   . Exertional dyspnea 08/20/2012  . History of blood transfusion 1994; 2004    "w/both bypass ORs"  . History of carotid endarterectomy 1994    right  . Recurrent falls     "last fall 08/15/2012" (08/20/2012)  . Arthritis     "I suffer all over my body; dr said it could be from arthritis" (08/20/2012)  . Chronic lower back pain   . Anxiety   . Mental disorder   . Prostate cancer [facility]) 1988    radiation treatment  . Hypothyroidism   . OSA (obstructive sleep apnea)   . Gout     "thumb" (08/20/2012)  . Incontinence of urine     Past  Surgical History  Procedure Laterality Date  . Appendectomy    . Cataract extraction w/ intraocular lens  implant, bilateral    . Coronary artery bypass graft  1994; 2004  . Prostatectomy    . Hernia repair      UHR  . Cardiac valve replacement  2004    AVR  . Orchiectomy  2001    bilateral  . Penile prosthesis  removal  2001  . Circumcision  2001    Family History: Family History  Problem Relation Age of Onset  . Hypertension Father   . Depression Father     Social History:   reports that he has quit smoking. His smoking use included Cigarettes. He has a  137.5 pack-year smoking history. He has never used smokeless tobacco. He reports that he does not drink alcohol or use illicit drugs.  Allergies:  Perocet, zocor, pcn.    Medications:                                                                                                                         Current facility-administered medications:  .  alteplase (ACTIVASE) 1 mg/mL infusion 73 mg, 0.9 mg/kg, Intravenous, Once, 73 mg at 11/15/2015 1106 **FOLLOWED BY** [COMPLETED] 0.9 %  sodium chloride infusion, 50 mL, Intravenous, Once, Quanasia Defino Fuller Mandril, MD, Last Rate: 73 mL/hr at 11/21/2015 1131, 73 mL at 11/04/2015 1131 .  alteplase (ACTIVASE) 30mg /7mL-for IA use/Neuro-IR, 1-30 mg, Intra-arterial, to XRAY, Luanne Bras, MD .  niCARdipine in saline (CARDENE-IV) 20-0.86 MG/200ML-% infusion SOLN, , , ,  .  vancomycin (VANCOCIN) IVPB 1000 mg/200 mL premix, 1,000 mg, Intravenous, to XRAY, Luanne Bras, MD  Current outpatient prescriptions:  .  allopurinol (ZYLOPRIM) 100 MG tablet, Take 100 mg by mouth daily. , Disp: , Rfl:  .  aspirin EC 81 MG tablet, Take 81 mg by mouth daily., Disp: , Rfl:  .  bimatoprost (LUMIGAN) 0.01 % SOLN, Place 1 drop into both eyes at bedtime., Disp: , Rfl:  .  cilostazol (PLETAL) 50 MG tablet, Take 50 mg by mouth 2 (two) times daily., Disp: , Rfl:  .  clonazePAM (KLONOPIN) 2 MG tablet,  Take 2 mg by mouth at bedtime., Disp: , Rfl:  .  Cyanocobalamin (VITAMIN B 12 PO), Take 1,000 mcg by mouth daily., Disp: , Rfl:  .  finasteride (PROSCAR) 5 MG tablet, Take 5 mg by mouth every morning. , Disp: , Rfl: 8 .  HYDROcodone-acetaminophen (NORCO/VICODIN) 5-325 MG tablet, Take 1 tablet by mouth every 12 (twelve) hours as needed for moderate pain., Disp: , Rfl:  .  memantine (NAMENDA) 10 MG tablet, Take 10 mg by mouth 2 (two) times daily., Disp: , Rfl:  .  metoprolol succinate (TOPROL-XL) 25 MG 24 hr tablet, Take 25 mg by mouth daily., Disp: , Rfl:  .  Mometasone Furoate (ASMANEX HFA) 100 MCG/ACT AERO, Inhale 2 puffs into the lungs 2 (two) times daily as needed (shortness of breathe)., Disp: , Rfl:  .  ondansetron (ZOFRAN ODT) 4 MG disintegrating tablet, 4mg  ODT q4 hours prn nausea/vomit, Disp: 20 tablet, Rfl: 0 .  oxybutynin (DITROPAN) 5 MG tablet, Take 5 mg by mouth daily., Disp: , Rfl:  .  polyvinyl alcohol (LIQUIFILM TEARS) 1.4 % ophthalmic solution, Place 1 drop into both eyes 2 (two) times daily as needed for dry eyes. For 6 days, Disp: , Rfl:    ROS:  History    unobtainable from patient due to mental status    Neurologic Examination:                                                                                                      Blood pressure 186/88, pulse 79, resp. rate 14, weight 81 kg (178 lb 9.2 oz), SpO2 94 %. He had spontaneous eye opening, with left gaze deviation able to sustain antegrade strength in left upper and lower extremities complete hemi-neglect of the right side right hemiplegia. Right facial weakness noted. Pupils equal and reactive. No abnormal involuntary movements. Positive Babinski on the right.   NIHSS is 22.     Lab Results: Basic Metabolic Panel:  Recent Labs Lab 11/13/2015 1001 11/24/2015 1010  NA 140 141  K 4.2  4.1  CL 105 103  CO2 25  --   GLUCOSE 114* 113*  BUN 16 18  CREATININE 1.13 1.00  CALCIUM 9.1  --     Liver Function Tests:  Recent Labs Lab 10/30/2015 1001  AST 21  ALT 10*  ALKPHOS 101  BILITOT 0.4  PROT 6.6  ALBUMIN 3.3*   No results for input(s): LIPASE, AMYLASE in the last 168 hours. No results for input(s): AMMONIA in the last 168 hours.  CBC:  Recent Labs Lab 11/14/2015 1001 11/15/2015 1010  WBC 9.8  --   NEUTROABS 6.2  --   HGB 10.8* 12.2*  HCT 34.7* 36.0*  MCV 78.7  --   PLT 182  --     Cardiac Enzymes: No results for input(s): CKTOTAL, CKMB, CKMBINDEX, TROPONINI in the last 168 hours.  Lipid Panel: No results for input(s): CHOL, TRIG, HDL, CHOLHDL, VLDL, LDLCALC in the last 168 hours.  CBG:  Recent Labs Lab 11/21/2015 1015  GLUCAP 103*    Microbiology: Results for orders placed or performed during the hospital encounter of 08/20/12  Urine culture     Status: None   Collection Time: 08/21/12  9:40 AM  Result Value Ref Range Status   Specimen Description URINE, CLEAN CATCH  Final   Special Requests NONE  Final   Culture  Setup Time 08/21/2012 11:00  Final   Colony Count >=100,000 COLONIES/ML  Final   Culture ESCHERICHIA COLI  Final   Report Status 08/23/2012 FINAL  Final   Organism ID, Bacteria ESCHERICHIA COLI  Final      Susceptibility   Escherichia coli - MIC*    AMPICILLIN 8 Sensitive     CEFAZOLIN <=4 Sensitive     CEFTRIAXONE <=1 Sensitive     CIPROFLOXACIN <=0.25 Sensitive     GENTAMICIN <=1 Sensitive     LEVOFLOXACIN <=0.12 Sensitive     NITROFURANTOIN <=16 Sensitive     TOBRAMYCIN <=1 Sensitive     TRIMETH/SULFA <=20 Sensitive     PIP/TAZO <=4 Sensitive     * ESCHERICHIA COLI  Urine culture     Status: None   Collection Time: 08/26/12 11:54 AM  Result Value Ref Range Status   Specimen Description URINE, RANDOM  Final   Special Requests  NONE  Final   Culture  Setup Time 08/26/2012 13:56  Final   Colony Count NO GROWTH  Final    Culture NO GROWTH  Final   Report Status 08/27/2012 FINAL  Final     Imaging: Ct Head Wo Contrast  11/27/2015  CLINICAL DATA:  Code stroke.  Right-sided weakness. EXAM: CT HEAD WITHOUT CONTRAST TECHNIQUE: Contiguous axial images were obtained from the base of the skull through the vertex without intravenous contrast. COMPARISON:  09/17/2015 FINDINGS: There is no evidence of mass effect, midline shift, or extra-axial fluid collections. There is no evidence of a space-occupying lesion or intracranial hemorrhage. There is no evidence of a cortical-based area of acute infarction. There is generalized cerebral atrophy. There is periventricular white matter low attenuation likely secondary to microangiopathy. The ventricles and sulci are appropriate for the patient's age. The basal cisterns are patent. Visualized portions of the orbits are unremarkable. The visualized portions of the paranasal sinuses and mastoid air cells are unremarkable. Cerebrovascular atherosclerotic calcifications are noted. The osseous structures are unremarkable. IMPRESSION: 1. No acute intracranial pathology. 2. Chronic microvascular disease and cerebral atrophy. These results were called by telephone at the time of interpretation on 11/28/2015 at 10:16 am to Dr. Silverio Decamp, who verbally acknowledged these results. Electronically Signed   By: Kathreen Devoid   On: 11/17/2015 10:23    Assessment and plan:   MUJTABA DYCK is an 82 y.o. male patient who presented to the ER with clinical examination suggestive of a dense left MCA stroke. A stat CT of the head was done for stroke code protocol. No evidence of intracerebral hemorrhage is noted. He is within the time window for IV TPA, with no known contraindications. IV TPA was started in the ER, with documentation per stroke nursing notes.   Following the IV TPA initiation, a stat CT angiogram of the head and neck were obtained. It showed complete occlusion of the distal left M1. Dr.  Elroy Channel was consulted for acute thrombectomy. Patient was still within the window for thrombectomy. Discussed the current assessment, treatment options including thrombectomy for the left M1 occlusion with patient's daughter, discussed the risks, benefit and she agreed to proceed with the procedure.    Patient was taken to interventional radiology suite for the thrombectomy procedure. Complete recannulization of the left MCA was obtained. Patient was intubated and sedated with propofol for the procedure.  Following the procedure, he was admitted to the neuro ICU. Goal systolic blood pressure to remain between 110 to 130. Postprocedure CT of the head showed a small intraparenchymal hemorrhage in the left temporal location and some contrast enhancement due to reperfusion in the left parietotemporal areas.  Patient remains intubated, sedated on propofol. He has been having labile blood pressure in addition to tachycardia, being monitored closely by the critical care team.   Post-TPA orders have been placed. We will obtain MRI of the brain, echocardiogram tomorrow morning. He is on continuous cardiac telemetry and blood pressure monitoring. SCDs for DVT prophylaxis.  Stroke team will continue to follow-up signed tomorrow morning.    The patient is critically ill with neurological organ system failure and requires high complexity decision making for assessment and support, frequent evaluation and titration of therapies, application of advanced monitoring technologies and extensive interpretation of multiple databases.  Critical Care Time devoted to patient care services described in this note is 45 Minutes

## 2015-11-26 ENCOUNTER — Inpatient Hospital Stay (HOSPITAL_COMMUNITY): Payer: PPO

## 2015-11-26 ENCOUNTER — Other Ambulatory Visit (HOSPITAL_COMMUNITY): Payer: Medicare Other

## 2015-11-26 DIAGNOSIS — I638 Other cerebral infarction: Secondary | ICD-10-CM

## 2015-11-26 DIAGNOSIS — I4891 Unspecified atrial fibrillation: Secondary | ICD-10-CM | POA: Insufficient documentation

## 2015-11-26 DIAGNOSIS — D62 Acute posthemorrhagic anemia: Secondary | ICD-10-CM

## 2015-11-26 DIAGNOSIS — I6330 Cerebral infarction due to thrombosis of unspecified cerebral artery: Secondary | ICD-10-CM

## 2015-11-26 DIAGNOSIS — N3000 Acute cystitis without hematuria: Secondary | ICD-10-CM

## 2015-11-26 LAB — LIPID PANEL
CHOL/HDL RATIO: 4 ratio
Cholesterol: 112 mg/dL (ref 0–200)
HDL: 28 mg/dL — AB (ref >40)
LDL Cholesterol: 53 mg/dL (ref 0–99)
Triglycerides: 155 mg/dL — ABNORMAL HIGH (ref <150)
VLDL: 31 mg/dL (ref 0–40)

## 2015-11-26 LAB — BLOOD GAS, ARTERIAL
Acid-base deficit: 7.1 mmol/L — ABNORMAL HIGH (ref 0.0–2.0)
BICARBONATE: 17.3 meq/L — AB (ref 20.0–24.0)
Drawn by: 41308
FIO2: 0.4
LHR: 20 {breaths}/min
O2 SAT: 98.9 %
PCO2 ART: 31.8 mmHg — AB (ref 35.0–45.0)
PEEP/CPAP: 5 cmH2O
PH ART: 7.356 (ref 7.350–7.450)
PO2 ART: 155 mmHg — AB (ref 80.0–100.0)
Patient temperature: 98.6
TCO2: 18.3 mmol/L (ref 0–100)
VT: 570 mL

## 2015-11-26 LAB — CBC WITH DIFFERENTIAL/PLATELET
Basophils Absolute: 0 10*3/uL (ref 0.0–0.1)
Basophils Relative: 0 %
EOS ABS: 0 10*3/uL (ref 0.0–0.7)
EOS PCT: 0 %
HCT: 28.7 % — ABNORMAL LOW (ref 39.0–52.0)
Hemoglobin: 9.7 g/dL — ABNORMAL LOW (ref 13.0–17.0)
LYMPHS ABS: 1.4 10*3/uL (ref 0.7–4.0)
Lymphocytes Relative: 7 %
MCH: 27.3 pg (ref 26.0–34.0)
MCHC: 33.8 g/dL (ref 30.0–36.0)
MCV: 80.8 fL (ref 78.0–100.0)
MONO ABS: 1.5 10*3/uL — AB (ref 0.1–1.0)
MONOS PCT: 7 %
NEUTROS ABS: 17.6 10*3/uL — AB (ref 1.7–7.7)
NEUTROS PCT: 86 %
PLATELETS: 149 10*3/uL — AB (ref 150–400)
RBC: 3.55 MIL/uL — ABNORMAL LOW (ref 4.22–5.81)
RDW: 15.4 % (ref 11.5–15.5)
WBC: 20.5 10*3/uL — ABNORMAL HIGH (ref 4.0–10.5)

## 2015-11-26 LAB — GLUCOSE, CAPILLARY: Glucose-Capillary: 115 mg/dL — ABNORMAL HIGH (ref 65–99)

## 2015-11-26 LAB — BASIC METABOLIC PANEL
Anion gap: 12 (ref 5–15)
BUN: 19 mg/dL (ref 6–20)
CALCIUM: 7.6 mg/dL — AB (ref 8.9–10.3)
CO2: 19 mmol/L — AB (ref 22–32)
CREATININE: 1.3 mg/dL — AB (ref 0.61–1.24)
Chloride: 108 mmol/L (ref 101–111)
GFR calc Af Amer: 54 mL/min — ABNORMAL LOW (ref >60)
GFR calc non Af Amer: 47 mL/min — ABNORMAL LOW (ref >60)
GLUCOSE: 133 mg/dL — AB (ref 65–99)
Potassium: 4.4 mmol/L (ref 3.5–5.1)
Sodium: 139 mmol/L (ref 135–145)

## 2015-11-26 LAB — MAGNESIUM: Magnesium: 1.5 mg/dL — ABNORMAL LOW (ref 1.7–2.4)

## 2015-11-26 LAB — PHOSPHORUS: Phosphorus: 4.1 mg/dL (ref 2.5–4.6)

## 2015-11-26 MED ORDER — METOPROLOL TARTRATE 25 MG/10 ML ORAL SUSPENSION
12.5000 mg | Freq: Two times a day (BID) | ORAL | Status: DC
  Administered 2015-11-26 – 2015-11-27 (×3): 12.5 mg
  Filled 2015-11-26 (×4): qty 10

## 2015-11-26 MED ORDER — MAGNESIUM SULFATE 2 GM/50ML IV SOLN
2.0000 g | Freq: Once | INTRAVENOUS | Status: AC
  Administered 2015-11-26: 2 g via INTRAVENOUS
  Filled 2015-11-26: qty 50

## 2015-11-26 NOTE — Progress Notes (Signed)
Orthopedic Tech Progress Note Patient Details:  Logan Allen 08-Sep-1924 [id number]  Patient ID: [name], male   DOB: Aug 23, 1924, 82 y.o.   MRN: [id number]   [name] 11/26/2015, 1:23 PMSand bag

## 2015-11-26 NOTE — Progress Notes (Signed)
PT Cancellation Note  Patient Details Name: Logan Allen MRN: [id number] DOB: 1924/09/19   Cancelled Treatment:    Reason Eval/Treat Not Completed: Patient not medically ready.  Pt remains intubated and sedated on bed rest.  Will need updated activity orders before PT evaluation can be completed, once pt is medically appropriate.  Thank you for this order.  Joslyn Hy PT, DPT [phone] Pager: [phone] 11/26/2015, 8:46 AM

## 2015-11-26 NOTE — Progress Notes (Signed)
The 9 fr sheath was removed with a V-Pad at 12:53 pm by Noah Delaine.  Hemostasis was achieved at   13:15 .A 10 lb sandbag was applied with a pressure dressing.  CCD

## 2015-11-26 NOTE — Progress Notes (Signed)
STROKE TEAM PROGRESS NOTE   HISTORY OF PRESENT ILLNESS Logan Allen is an 82 y.o. male patient who was brought in by the EMTs with altered mental status, acute onset of right sided weakness and left gaze deviation. Patient lives in an assisted living facility. Last seen normal was at 7 AM when he was noted to be walking with a walker.  The facility staff found him with the right-sided weakness past 9 AM. After that, EMTs were called in and was transported to the ER. Patient's family was not immediately available to initial evaluation but her daughter came into the ER about 30 minutes later. Her daughter reported that patient was diagnosed with atrial fibrillation at least 1-2 years ago but hasn't been on any anticoagulation. He is just on aspirin daily. No history of prior intracerebral hemorrhage, or any recent bleeding or surgeries or head trauma.   Date last known well: 11/27/2015 Time last known well: 0700 tPA Given:  He received IV TPA 73 mg Friday, 11/18/2015 at 1045.   SUBJECTIVE (INTERVAL HISTORY) No family is at the bedside.  He is still intubated, temporary off propofol for neuro exam. Not open eyes, or following commands. Post op CT concerning for hemorrhagic transformation, MRI pending. Left upper extremity localizing to pain but still has right hemiplegia. Had anemia hemoglobin 6.4 overnight, received PRBC transfusion, this morning hemoglobin 9.7. Had a cardioversion overnight for A. fib with RVR, currently in sinus tachycardia.   OBJECTIVE Temp:  [97 F (36.1 C)-99.6 F (37.6 C)] 98.3 F (36.8 C) (01/28 0415) Pulse Rate:  [79-123] 103 (01/28 0344) Cardiac Rhythm:  [-] Atrial fibrillation (01/27 2000) Resp:  [11-40] 20 (01/28 0730) BP: (61-189)/(37-151) 129/55 mmHg (01/28 0730) SpO2:  [75 %-100 %] 100 % (01/28 0750) Arterial Line BP: (74-181)/(29-64) 155/51 mmHg (01/28 0730) FiO2 (%):  [30 %-100 %] 40 % (01/28 0750) Weight:  [76.2 kg (167 lb 15.9 oz)-81 kg (178 lb 9.2  oz)] 76.2 kg (167 lb 15.9 oz) (01/27 1445)  CBC:  Recent Labs Lab 11/11/2015 1001  11/07/2015 2108 11/26/15 0603  WBC 9.8  --  24.9* 20.5*  NEUTROABS 6.2  --   --  17.6*  HGB 10.8*  < > 6.4* 9.7*  HCT 34.7*  < > 20.4* 28.7*  MCV 78.7  --  79.7 80.8  PLT 182  --  230 149*  < > = values in this interval not displayed.  Basic Metabolic Panel:  Recent Labs Lab 11/23/2015 1001 11/02/2015 1010 11/26/15 0603  NA 140 141 139  K 4.2 4.1 4.4  CL 105 103 108  CO2 25  --  19*  GLUCOSE 114* 113* 133*  BUN 16 18 19   CREATININE 1.13 1.00 1.30*  CALCIUM 9.1  --  7.6*  MG  --   --  1.5*  PHOS  --   --  4.1    Lipid Panel:    Component Value Date/Time   CHOL 112 11/26/2015 0603   TRIG 155* 11/26/2015 0603   HDL 28* 11/26/2015 0603   CHOLHDL 4.0 11/26/2015 0603   VLDL 31 11/26/2015 0603   LDLCALC 53 11/26/2015 0603   HgbA1c: No results found for: HGBA1C Urine Drug Screen: No results found for: LABOPIA, COCAINSCRNUR, LABBENZ, AMPHETMU, THCU, LABBARB    IMAGING I have personally reviewed the radiological images below and agree with the radiology interpretations.  Ct Angio Head and Neck W/cm &/or Wo Cm 11/02/2015   1. Occlusion of the left middle cerebral artery at  the bifurcation. No posterior branch vessels fill. There are poor collaterals.  2. Minimal opacification of anterior left MCA branch vessels.  3. Large posterior left MCA territory developing infarct without hemorrhage.  4. Mild attenuation of anterior MCA branch vessels on the right.  5. Atherosclerotic calcifications at the aortic arch in carotid bifurcations bilaterally is well is the cavernous internal carotid arteries without significant stenoses.  6. Less than 50% stenosis of the left vertebral artery at the dural margin. Other atherosclerotic changes are present in the vertebral arteries bilaterally without significant stenoses.  7. Multilevel spondylosis of the cervical spine.  8. Emphysema and bilateral pleural  effusions, right greater than left with associated atelectasis.   Ct Head Wo Contrast 11/10/2015   Extensive contrast staining in the left temporal and parietal lobes is consistent with reperfusion after interventional procedure. Ovoid collection of contrast the anterior left temporal lobe may represent more intense contrast staining but coexistent hemorrhage is possible. Evolution of left MCA territory infarct. Atrophy and chronic microvascular ischemic change.   Ct Head Wo Contrast 11/17/2015   1. No acute intracranial pathology.  2. Chronic microvascular disease and cerebral atrophy.   MRI of the brain without contrast - Acute infarction throughout the majority of the left middle cerebral artery territory as described above. Punctate acute infarctions elsewhere scattered within the cerebellum and both cerebral hemispheres. Mild swelling in the region of the left MCA infarction but without shift at this time. 1.5 x 3 cm hematoma in the anterior temporal lobe on the left with a 12 mm Sever's hematoma just lateral to that.  Cerebral angio S/P lt common carotid arteriogram,followed by complete revascularization of Lt MCA occlusion using x 2 passes with 85mm x 20 mm trevoprovue and x 1 pass with 4 mmx 40 mm solitaire FR device and 9 mg of superselective IA tpa with near complete revascularization.TICI 2b flow restored  Dg Chest Port 1 View 11/26/2015   Minimal bibasilar atelectasis.   Dg Chest Port 1 View 11/28/2015   No acute cardiopulmonary disease. Appropriate position of endotracheal tube.   2D echo - pending   PHYSICAL EXAM  Temp:  [97 F (36.1 C)-99.6 F (37.6 C)] 99.1 F (37.3 C) (01/28 0821) Pulse Rate:  [63-123] 79 (01/28 1300) Resp:  [11-40] 20 (01/28 1300) BP: (61-184)/(37-161) 107/47 mmHg (01/28 1300) SpO2:  [0 %-100 %] 100 % (01/28 1300) Arterial Line BP: (74-200)/(29-64) 123/40 mmHg (01/28 1245) FiO2 (%):  [30 %-100 %] 40 % (01/28 1147) Weight:  [167 lb 15.9 oz  (76.2 kg)] 167 lb 15.9 oz (76.2 kg) (01/27 1445)  General - Well nourished, well developed, intubated, off propofol for neuro exam.  Ophthalmologic - Fundi not visualized due to small pupils.  Cardiovascular - Regular rhythm,  but tachycardia .  Neuro -  intubated, off propofol for neuro exam, not open eyes on voice, not following commands. PERRL, mid position, doll's eyes positive, weak corneal bilaterally, negative gag reflex left upper extremity localizes to pain, left lower extremity withdraw to pain stimulation but not against gravity. Right upper extremity 0/5, right lower extremity minimal withdrawal to pain stimulation. Bilateral Babinski positive.  sensation, coordination or gait cannot be tested.  ASSESSMENT/PLAN Logan Allen is a 82 y.o. male with history of CAD, HTN, afib not on AC, HLD, COPD, s/p right CEA, multiple falls, mental disorder, OSA, and gout presenting with acute onset of AMS, right-sided weakness and left gaze deviation. Received IV TPA and status post percutaneous thrombectomy.  Stroke:  Left MCA large infarct with hemorrhagic transformation, right MCA and ACA punctate infarcts, status post TPA and endovascular thrombectomy, due to A. fib not on AC.  Resultant  intubation, right hemiplegia  MRI - left MCA large infarct with hemorrhagic transformation, right MCA and ACA punctate infarcts  CTA of head and neck -  left M1 occlusion.  2D Echo - pending  LDL - 53  HgbA1c pending  SCDs for VTE prophylaxis  Diet NPO time specified  aspirin 81 mg daily prior to admission, now on No antithrombotic secondary to hemorrhagic transformation   Patient counseled to be compliant with his antithrombotic medications  Ongoing aggressive stroke risk factor management  Therapy recommendations: Pending  Disposition: Pending  A. fib RVR  had a cardioversion overnight    currently sinus tachycardia   on amiodarone drip  On metoprolol  May consider  cardiology consult if needed  Anemia   Hb 6.4 last night  Received PRBC  transfusion   Repeat Hb 9.7  CBC monnitoring  UTI  UA WBC TNTC  Leukocytosis WBC 24.9  On levaquin  Hypertension  Stable  BP goal < 160 due to hemorrhagic transformation   Cardene drip when necessary  Other Stroke Risk Factors  Advanced age  Cigarette smoker, quit smoking.  Obstructive sleep apnea  Other Active Problems  Elevated triglycerides  Renal insufficiency  Leukocytosis  Mild thrombocytopenia  Gout   S/p right CEA  COPD   Hospital day # 1  This patient is critically ill due to left large MCA infarct with hemorrhagic transformation, A. fib RVR, respiratory failure and at significant risk of neurological worsening, death form  Recurrent stroke, enlargement of the hematoma, cerebral edema, brain herniation, heart failure. This patient's care requires constant monitoring of vital signs, hemodynamics, respiratory and cardiac monitoring, review of multiple databases, neurological assessment, discussion with family, other specialists and medical decision making of high complexity. I spent 45 minutes of neurocritical care time in the care of this patient.  Rosalin Hawking, MD PhD Stroke Neurology 11/26/2015 1:38 PM   To contact Stroke Continuity provider, please refer to [email or web address] After hours, contact General Neurology

## 2015-11-26 NOTE — Plan of Care (Signed)
CRITICAL EVENT NOTE  Called to the bedside for hypotension and tachycardia.  Patient in Afib with RVR and hemodynamically unstable.  Phenylephrine started, amiodarone 150mg  over 42min, with concurrent synchronized cardioversion performed with restoration of sinus tachycardia.  Persistent hypotension, LR x2 L bolus, Central line placed.  No obvious signs of bleeding, on exam.  Bedside US demonstrated no e/o pneumothorax.  After second liter of LR, pressures improved. Phenylephrine weaned off.  Likely neurogenic shock complicated by Afib with RVR.  Patient stable when I left room, Left femoral CVC in place.  Total critical care time: 30 min  Critical care time was exclusive of separately billable procedures and treating other patients.  Critical care was necessary to treat or prevent imminent or life-threatening deterioration.  Critical care was time spent personally by me on the following activities: development of treatment plan with patient and/or surrogate as well as nursing, discussions with consultants, evaluation of patient's response to treatment, examination of patient, obtaining history from patient or surrogate, ordering and performing treatments and interventions, ordering and review of laboratory studies, ordering and review of radiographic studies, pulse oximetry and re-evaluation of patient's condition.   Meribeth Mattes, DO., MS [facility]

## 2015-11-26 NOTE — Procedures (Signed)
CENTRAL VENOUS CATHETER INSERTION   Indication: Shock Consent: yes Time out: yes Appropriate position: yes Hand washing: yes Patient Sterilized and Draped: yes Location: Left groin.  Failed attempt Left subclavian # of Attempts: 1 Ultrasound Guidance: yes Wire Confirmed with [place]: yes Insertion depth: 20 cm All Ports Draw and flush: yes CXR:   Pneumothorax: no  Line position appropriate: yes Line sutured in place: yes EBL: 5 cc Complications: no Patient Tolerated Procedure Well: yes     Meribeth Mattes, DO., MS [facility]

## 2015-11-26 NOTE — Progress Notes (Signed)
Initial Nutrition Assessment  DOCUMENTATION CODES:  Not applicable  INTERVENTION:  If unable to extubate and medically recommend TF: Vital af 1.2 @ 20 ml/hr via OG/NGT and increase by 10 ml every 4 hours to goal rate of 55 ml/hr.   Tube feeding regimen provides 1584 kcal +256 from propofol (103% of needs), 99 grams of protein, and 1071 ml of H2O.   NUTRITION DIAGNOSIS:  Inadequate oral intake related to inability to eat as evidenced by NPO status.  GOAL:  Patient will meet greater than or equal to 90% of their needs  MONITOR:  Diet advancement, Vent status, Labs, I & O's  REASON FOR ASSESSMENT:  Ventilator    ASSESSMENT:  82 y/o male PMHx CAD, HTN, COPD, OSA, A-fib, Cancer, anxiety presents from ALF after facility staff found him w/ r sided weakness. Pt found to have complete m1 occlusion and underwent successful thrombectomy. Pt remains intubated/sedated.  Pt intubated/sedated with no family/friend present to obtain any hx from.   NFPE: WDL, no edema. Abdomen soft, non distended.   Patient is currently intubated on ventilator support MV: 11.2 L/min Temp (24hrs), Avg:98.7 F (37.1 C), Min:97 F (36.1 C), Max:99.6 F (37.6 C)  Propofol: 9.7 ml/hr = 256 kcals/day  Diet Order:  Diet NPO time specified  Skin: Dry, Ecchymotic  Last BM:  Unknown  Height:  Ht Readings from Last 1 Encounters:  11/23/2015 5\' 9"  (1.753 m)   Weight:  Wt Readings from Last 1 Encounters:  11/04/2015 167 lb 15.9 oz (76.2 kg)   Wt Readings from Last 10 Encounters:  11/24/2015 167 lb 15.9 oz (76.2 kg)  04/12/15 178 lb 3.2 oz (80.831 kg)  04/01/15 177 lb 3.2 oz (80.377 kg)  03/29/15 174 lb (78.926 kg)  08/20/12 174 lb 6.1 oz (79.1 kg)  11/17/10 194 lb (87.998 kg)  11/01/10 194 lb 2.1 oz (88.057 kg)  05/17/10 211 lb (95.709 kg)  04/26/08 189 lb 2.1 oz (85.789 kg)  04/12/08 226 lb (102.513 kg)   Ideal Body Weight:  72.73 kg  BMI:  Body mass index is 24.8 kg/(m^2).  Estimated Nutritional  Needs:  Kcal:  1781 kcals Protein:  99-114 (1.3-1.5 g/kg bw) Fluid:  1.8 liters  EDUCATION NEEDS:  No education needs identified at this time  Burtis Junes RD, LDN Nutrition Pager: [phone] 11/26/2015 2:27 PM

## 2015-11-26 NOTE — Progress Notes (Signed)
PULMONARY / CRITICAL CARE MEDICINE   Name: Logan Allen MRN: [id number] DOB: Jan 02, 1924    ADMISSION DATE:  11/24/2015 CONSULTATION DATE:  11/04/2015  REFERRING MD:  Dr. Silverio Decamp  CHIEF COMPLAINT:  CVA   HISTORY OF PRESENT ILLNESS:   82 y/o M with a PMH of HTN, CAD s/p CABG, carotid endarterectomy, prostate cancer (1998), OSA and COPD admitted 1/27 with dense L MCA stroke.  He received systemic TPA and IR thrombectomy.  He remained intubated post IR.     SUBJECTIVE/OVERNIGHT: AFib with RVR, hypotension overnight requiring pressors, amiodarone and eventual cardioversion.  Remains on propofol, still with femoral sheath post IR.    Awaiting repeat CT.   VITAL SIGNS: BP 150/51 mmHg  Pulse 97  Temp(Src) 99.1 F (37.3 C) (Oral)  Resp 20  Ht 5\' 9"  (1.753 m)  Wt 76.2 kg (167 lb 15.9 oz)  BMI 24.80 kg/m2  SpO2 100%  HEMODYNAMICS: CVP:  [2 mmHg-10 mmHg] 2 mmHg  VENTILATOR SETTINGS: Vent Mode:  [-] PRVC FiO2 (%):  [30 %-100 %] 40 % Set Rate:  [14 bmp-20 bmp] 20 bmp Vt Set:  [570 mL] 570 mL PEEP:  [5 cmH20] 5 cmH20 Plateau Pressure:  [13 cmH20-15 cmH20] 15 cmH20  INTAKE / OUTPUT: I/O last 3 completed shifts: In: 3782.8 [I.V.:2912.8; Blood:670; IV Piggyback:200] Out: 1050 [Urine:1050]  PHYSICAL EXAMINATION: General:  Chronically ill appearing, frail elder on vent  Neuro:  Sedate, RASS -2. Flaccid R, withdraws to pain on L, remains on propofol  HEENT:  MM pink/moist, no jvd Cardiovascular:  s1s2 rrr, no m/r/g Lungs:  Even/non-labored, diminished on L Abdomen:  Obese/soft, bsx4 active  Musculoskeletal:  No acute deformities  Skin:  Warm/dry, multiple areas of ecchymosis, R groin hematoma with sheath in place, soft  LABS:  BMET  Recent Labs Lab 11/08/2015 1001 11/07/2015 1010 11/26/15 0603  NA 140 141 139  K 4.2 4.1 4.4  CL 105 103 108  CO2 25  --  19*  BUN 16 18 19   CREATININE 1.13 1.00 1.30*  GLUCOSE 114* 113* 133*    Electrolytes  Recent Labs Lab  11/26/2015 1001 11/26/15 0603  CALCIUM 9.1 7.6*  MG  --  1.5*  PHOS  --  4.1    CBC  Recent Labs Lab 11/12/2015 1001 11/23/2015 1010 11/11/2015 2108 11/26/15 0603  WBC 9.8  --  24.9* 20.5*  HGB 10.8* 12.2* 6.4* 9.7*  HCT 34.7* 36.0* 20.4* 28.7*  PLT 182  --  230 149*    Coag's  Recent Labs Lab 11/21/2015 1001  APTT 33  INR 1.01    Sepsis Markers No results for input(s): LATICACIDVEN, PROCALCITON, O2SATVEN in the last 168 hours.  ABG  Recent Labs Lab 11/15/2015 1611 11/28/2015 2050 11/26/15 0350  PHART 7.377 7.236* 7.356  PCO2ART 35.7 33.0* 31.8*  PO2ART 409.0* 79.0* 155*    Liver Enzymes  Recent Labs Lab 11/01/2015 1001  AST 21  ALT 10*  ALKPHOS 101  BILITOT 0.4  ALBUMIN 3.3*    Cardiac Enzymes No results for input(s): TROPONINI, PROBNP in the last 168 hours.  Glucose  Recent Labs Lab 11/07/2015 1015  GLUCAP 103*    Imaging Ct Angio Head W/cm &/or Wo Cm  11/17/2015  CLINICAL DATA:  Stroke.  Right-sided weakness.  Lift gaze deviation. EXAM: CT ANGIOGRAPHY HEAD AND NECK TECHNIQUE: Multidetector CT imaging of the head and neck was performed using the standard protocol during bolus administration of intravenous contrast. Multiplanar CT image reconstructions and MIPs were  obtained to evaluate the vascular anatomy. Carotid stenosis measurements (when applicable) are obtained utilizing NASCET criteria, using the distal internal carotid diameter as the denominator. CONTRAST:  33mL OMNIPAQUE IOHEXOL 350 MG/ML SOLN COMPARISON:  CT head without contrast same day. FINDINGS: CT HEAD Brain: The source images demonstrated developing large posterior left MCA territory infarct with the symmetry of cortical density. Is asymmetric attenuation of the left lentiform nucleus is well. No acute hemorrhage is present. Diffuse periventricular and subcortical white matter changes are present bilaterally. Calvarium and skull base: Within normal limits Paranasal sinuses: Clear Orbits:  Bilateral lens replacements are noted. CTA NECK Aortic arch: A 3 vessel arch configuration is present. Atherosclerotic calcifications are present at the aortic arch without stenosis or aneurysm. Right carotid system: The right common carotid artery in is within normal limits. Dense atherosclerotic calcifications are present at the carotid bifurcation. The lumen is narrowed to 1.6 mm. This compares with a more distal vessel of 4.8 mm. Mild tortuosity is present. Atherosclerotic calcifications are present just proximal to the skullbase without significant stenosis. Left carotid system: Atherosclerotic calcifications are present in the distal left common carotid artery. Additional calcifications are present at the carotid bifurcation. There is no significant focal stenosis. There is mild tortuosity of the cervical internal carotid artery with distal calcifications and mild wall irregularity. Vertebral arteries:The vertebral arteries originate from the subclavian arteries bilaterally. There is moderate proximal tortuosity of both vessels without a significant stenosis. The right vertebral artery is the dominant vessel. Focal calcification is present at the C2-3 level on the left. Additional calcifications are present in both vertebral arteries at the C1 level. There is no significant stenosis on either side. Skeleton: Grade 1 retrolisthesis is present at C3-4 and C4-5. There is chronic loss of disc height throughout the cervical spine, most evident at C6-7. Slight anterolisthesis is present at C7-T1. No focal lytic or blastic lesions are present. Other neck: No focal mucosal or submucosal lesions are present. The thyroid is within normal limits. No significant adenopathy is present. Bilateral pleural effusions are present, right greater than left. There is mild dependent atelectasis associated. Emphysema is noted. CTA HEAD Anterior circulation: Atherosclerotic changes are present throughout the cavernous internal  carotid arteries bilaterally. The A1 segments are within normal limits bilaterally. The anterior communicating artery is patent. The right MCA is within normal limits. The bifurcation is intact. There is some attenuation of anterior MCA branch vessels distally. The left MCA is occluded at the bifurcation. Anterior branch vessels are opacified. There is no opacification of posterior branch vessels. No significant collaterals are present. Posterior circulation: The left vertebral artery demonstrates a less than 50% stenosis distal to the dural margin. The right vertebral artery is intact. The left PICA origin is visualized and normal. The right AICA is dominant. The basilar artery is within normal limits. Both posterior cerebral arteries originate from the basilar tip. The PCA branch vessels demonstrate mild distal attenuation. Venous sinuses: The dural sinuses are patent. The transverse sinuses are codominant. The straight sinus and deep cerebral veins are intact. The cortical veins are within normal limits. There is no significant drainage over the posterior left MCA territory. Anatomic variants: None Delayed phase: Not performed IMPRESSION: 1. Occlusion of the left middle cerebral artery at the bifurcation. No posterior branch vessels fill. There are poor collaterals. 2. Minimal opacification of anterior left MCA branch vessels. 3. Large posterior left MCA territory developing infarct without hemorrhage. 4. Mild attenuation of anterior MCA branch vessels on the right.  5. Atherosclerotic calcifications at the aortic arch in carotid bifurcations bilaterally is well is the cavernous internal carotid arteries without significant stenoses. 6. Less than 50% stenosis of the left vertebral artery at the dural margin. Other atherosclerotic changes are present in the vertebral arteries bilaterally without significant stenoses. 7. Multilevel spondylosis of the cervical spine. 8. Emphysema and bilateral pleural effusions,  right greater than left with associated atelectasis. These results were called by telephone at the time of interpretation on 11/28/2015 at 11:43 am to Dr. Audria Nine , who verbally acknowledged these results. Electronically Signed   By: San Morelle M.D.   On: 11/01/2015 12:06   Ct Head Wo Contrast  11/17/2015  CLINICAL DATA:  Status post cerebral angiogram and treatment of left MCA embolus today. Postprocedural examination. EXAM: CT HEAD WITHOUT CONTRAST TECHNIQUE: Contiguous axial images were obtained from the base of the skull through the vertex without intravenous contrast. COMPARISON:  Head CT scan 11/06/2015. FINDINGS: There is extensive contrast staining in the left parietal and temporal lobes. An ovoid collection of high attenuation is seen in the anterior left temporal lobe measuring approximately 3.6 cm AP x 1.5 cm transverse. This may represent more intense and focal contrast staining but there may be coexistent hemorrhage in this location given its unusual can't figure. Hypoattenuation is seen in the left MCA territory and much more conspicuous than on the prior examination consistent with evolution of infarct. The brain is atrophic with chronic microvascular ischemic change. IMPRESSION: Extensive contrast staining in the left temporal and parietal lobes is consistent with reperfusion after interventional procedure. Ovoid collection of contrast the anterior left temporal lobe may represent more intense contrast staining but coexistent hemorrhage is possible. Evolution of left MCA territory infarct. Atrophy and chronic microvascular ischemic change. Electronically Signed   By: Inge Rise M.D.   On: 11/07/2015 14:37   Ct Head Wo Contrast  11/12/2015  CLINICAL DATA:  Code stroke.  Right-sided weakness. EXAM: CT HEAD WITHOUT CONTRAST TECHNIQUE: Contiguous axial images were obtained from the base of the skull through the vertex without intravenous contrast. COMPARISON:  09/17/2015  FINDINGS: There is no evidence of mass effect, midline shift, or extra-axial fluid collections. There is no evidence of a space-occupying lesion or intracranial hemorrhage. There is no evidence of a cortical-based area of acute infarction. There is generalized cerebral atrophy. There is periventricular white matter low attenuation likely secondary to microangiopathy. The ventricles and sulci are appropriate for the patient's age. The basal cisterns are patent. Visualized portions of the orbits are unremarkable. The visualized portions of the paranasal sinuses and mastoid air cells are unremarkable. Cerebrovascular atherosclerotic calcifications are noted. The osseous structures are unremarkable. IMPRESSION: 1. No acute intracranial pathology. 2. Chronic microvascular disease and cerebral atrophy. These results were called by telephone at the time of interpretation on 11/05/2015 at 10:16 am to Dr. Silverio Decamp, who verbally acknowledged these results. Electronically Signed   By: Kathreen Devoid   On: 11/03/2015 10:23   Ct Angio Neck W/cm &/or Wo/cm  11/12/2015  CLINICAL DATA:  Stroke.  Right-sided weakness.  Lift gaze deviation. EXAM: CT ANGIOGRAPHY HEAD AND NECK TECHNIQUE: Multidetector CT imaging of the head and neck was performed using the standard protocol during bolus administration of intravenous contrast. Multiplanar CT image reconstructions and MIPs were obtained to evaluate the vascular anatomy. Carotid stenosis measurements (when applicable) are obtained utilizing NASCET criteria, using the distal internal carotid diameter as the denominator. CONTRAST:  62mL OMNIPAQUE IOHEXOL 350 MG/ML SOLN COMPARISON:  CT head without contrast same day. FINDINGS: CT HEAD Brain: The source images demonstrated developing large posterior left MCA territory infarct with the symmetry of cortical density. Is asymmetric attenuation of the left lentiform nucleus is well. No acute hemorrhage is present. Diffuse periventricular and  subcortical white matter changes are present bilaterally. Calvarium and skull base: Within normal limits Paranasal sinuses: Clear Orbits: Bilateral lens replacements are noted. CTA NECK Aortic arch: A 3 vessel arch configuration is present. Atherosclerotic calcifications are present at the aortic arch without stenosis or aneurysm. Right carotid system: The right common carotid artery in is within normal limits. Dense atherosclerotic calcifications are present at the carotid bifurcation. The lumen is narrowed to 1.6 mm. This compares with a more distal vessel of 4.8 mm. Mild tortuosity is present. Atherosclerotic calcifications are present just proximal to the skullbase without significant stenosis. Left carotid system: Atherosclerotic calcifications are present in the distal left common carotid artery. Additional calcifications are present at the carotid bifurcation. There is no significant focal stenosis. There is mild tortuosity of the cervical internal carotid artery with distal calcifications and mild wall irregularity. Vertebral arteries:The vertebral arteries originate from the subclavian arteries bilaterally. There is moderate proximal tortuosity of both vessels without a significant stenosis. The right vertebral artery is the dominant vessel. Focal calcification is present at the C2-3 level on the left. Additional calcifications are present in both vertebral arteries at the C1 level. There is no significant stenosis on either side. Skeleton: Grade 1 retrolisthesis is present at C3-4 and C4-5. There is chronic loss of disc height throughout the cervical spine, most evident at C6-7. Slight anterolisthesis is present at C7-T1. No focal lytic or blastic lesions are present. Other neck: No focal mucosal or submucosal lesions are present. The thyroid is within normal limits. No significant adenopathy is present. Bilateral pleural effusions are present, right greater than left. There is mild dependent atelectasis  associated. Emphysema is noted. CTA HEAD Anterior circulation: Atherosclerotic changes are present throughout the cavernous internal carotid arteries bilaterally. The A1 segments are within normal limits bilaterally. The anterior communicating artery is patent. The right MCA is within normal limits. The bifurcation is intact. There is some attenuation of anterior MCA branch vessels distally. The left MCA is occluded at the bifurcation. Anterior branch vessels are opacified. There is no opacification of posterior branch vessels. No significant collaterals are present. Posterior circulation: The left vertebral artery demonstrates a less than 50% stenosis distal to the dural margin. The right vertebral artery is intact. The left PICA origin is visualized and normal. The right AICA is dominant. The basilar artery is within normal limits. Both posterior cerebral arteries originate from the basilar tip. The PCA branch vessels demonstrate mild distal attenuation. Venous sinuses: The dural sinuses are patent. The transverse sinuses are codominant. The straight sinus and deep cerebral veins are intact. The cortical veins are within normal limits. There is no significant drainage over the posterior left MCA territory. Anatomic variants: None Delayed phase: Not performed IMPRESSION: 1. Occlusion of the left middle cerebral artery at the bifurcation. No posterior branch vessels fill. There are poor collaterals. 2. Minimal opacification of anterior left MCA branch vessels. 3. Large posterior left MCA territory developing infarct without hemorrhage. 4. Mild attenuation of anterior MCA branch vessels on the right. 5. Atherosclerotic calcifications at the aortic arch in carotid bifurcations bilaterally is well is the cavernous internal carotid arteries without significant stenoses. 6. Less than 50% stenosis of the left vertebral artery at the dural  margin. Other atherosclerotic changes are present in the vertebral arteries  bilaterally without significant stenoses. 7. Multilevel spondylosis of the cervical spine. 8. Emphysema and bilateral pleural effusions, right greater than left with associated atelectasis. These results were called by telephone at the time of interpretation on 11/24/2015 at 11:43 am to Dr. Audria Nine , who verbally acknowledged these results. Electronically Signed   By: San Morelle M.D.   On: 11/24/2015 12:06   Dg Chest Port 1 View  11/26/2015  CLINICAL DATA:  Acute respiratory failure EXAM: PORTABLE CHEST 1 VIEW COMPARISON:  11/24/2015 FINDINGS: Postsurgical changes are again seen. An endotracheal tube is again noted approximately 6.8 cm above the carina. Minimal bibasilar atelectasis is seen. No focal confluent infiltrate is noted. No bony abnormality is seen. IMPRESSION: Minimal bibasilar atelectasis. Electronically Signed   By: Inez Catalina M.D.   On: 11/26/2015 07:13   Dg Chest Port 1 View  11/26/2015  CLINICAL DATA:  New central line insertion. EXAM: PORTABLE CHEST 1 VIEW COMPARISON:  11/17/2015 FINDINGS: Postoperative changes in the mediastinum. Endotracheal tube with tip measuring 6.9 cm above the carina. No radiopaque central venous catheters are identified. Shallow inspiration with atelectasis in the lung bases. Normal heart size and pulmonary vascularity. No focal consolidation in the lungs. No pneumothorax. Calcified and tortuous aorta. Old healed fracture deformity of the distal right clavicle. Degenerative changes in the spine and shoulders peer IMPRESSION: Endotracheal tube appears in satisfactory location. No radiopaque central venous catheter is identified. Shallow inspiration with atelectasis in the lung bases. Electronically Signed   By: Lucienne Capers M.D.   On: 11/24/2015 22:09   Dg Chest Port 1 View  11/13/2015  CLINICAL DATA:  Right-sided weakness. EXAM: PORTABLE CHEST 1 VIEW COMPARISON:  03/23/2015 FINDINGS: Endotracheal tube terminates 3.9 cm above carina.Patient  rotated to the right. Prior median sternotomy. Remote left rib trauma. Normal heart size. Atherosclerosis in the transverse aorta. No pleural effusion or pneumothorax. Right infrahilar and left base atelectasis. IMPRESSION: No acute cardiopulmonary disease. Appropriate position of endotracheal tube. Electronically Signed   By: Abigail Miyamoto M.D.   On: 11/18/2015 16:28     STUDIES:  1/27  CT Head >> no acute abnormality  1/27  CTA Head / Neck >> occlusion of L MCA 1/27  CT Head >> extensive contrast staining in L temporal & parietal lobes consistent with reperfusion after IR procedure, ovoid collection of contrast in anterior L temporal lobe, ? Of intense contrast staining vs coexistent hemorrhage, evolution of L MCA territory infarct   CULTURES: 1/27  UA >> TNTC wbc, many bacteria, 0-5 sq epithelials 1/27  UC >>   ANTIBIOTICS: Levaquin 1/27 (UTI) >>   SIGNIFICANT EVENTS: 1/27  Admit with AMS, aphasia, R sided weakness.    LINES/TUBES: OETT 1/27 >> R Fem Sheath 1/27 >>  L fem CVL 1/28>>>  DISCUSSION: 82 y/o M, SNF resident, admitted on 1/27 with altered mental status, aphasia and right sided weakness.  Work up consistent with MCA infarct, received TPA and patient taken to neuro-IR for revascularization.    ASSESSMENT / PLAN:  NEUROLOGIC A: MCA Infarct / Hemorrhage - s/p TPA + Neuro IR revascularization  Hx Arthritis, Frequent Falls, Chronic Low Back Pain, anxiety, dementia  P:   RASS goal: -2 Propofol for sedation  WUA and wean propofol post MRI and sheath removal  PRN Fentanyl for pain  Repeat imaging per Neurology -awaiting repeat CT this am to review area of concern for contrast vs hemorrhage Serial neuro  exams Hold home klonopin, namenda, effexor  PULMONARY A: Acute Respiratory Failure secondary to MCA CVA Hx OSA, COPD  P:   MV support, 8 cc/kg  Wean PEEP / FiO2 for sats 90-95% Duoneb Q6 with Q3 PRN albuterol  Daily SBT - ok for PS wean as tol - no extubation for  now  F/u CXR   CARDIOVASCULAR A:  Hx CAD, HTN, HLD, RBBB AFib with RVR and hemodynamic instability -- s/p cardioversion 1/28 P:  PRN labetalol  Resume home metoprolol 1/28 cardene gtt if further BP control needed  Remains off neo  No anticoagulation  Hold Pletal, ASA ICU monitoring of hemodynamics Assess ECHO   RENAL A:   AKI - mild  hypoMg  P:   Trend CBC / UOP  Replace electrolytes as indicated  Hold home oxybutynin NS @ 38ml/hr   GASTROINTESTINAL A:   At Risk Protein Calorie Malnutrition  P:   NPO  OGT  Consider TF 1/29  HEMATOLOGIC A:   At Risk Bleeding s/p TPA  Anemia  P:  Trend CBC  SCD's for DVT prophylaxis   INFECTIOUS A:   UTI  P:   ABX and cultures as above Monitor fever curve / WBC  ENDOCRINE A:   Hypothyroidism - ? Hx of, noted in hx but not on medications according to med rec P:   Monitor    FAMILY  - Updates: No family available 1/28.  Need ongoing goals of care discussions, suspect poor overall prognosis in this elderly pt with multiple medical problems and significant neuro event.   - Inter-disciplinary family meet or Palliative Care meeting due by:  2/4   Nickolas Madrid, NP 11/26/2015  8:44 AM Pager: [phone] or [phone]

## 2015-11-26 NOTE — Progress Notes (Signed)
eLink Physician-Brief Progress Note Patient Name: Logan Allen DOB: August 06, 1924 MRN: [id number]   Date of Service  11/26/2015  HPI/Events of Note  Review of KUB for NGT placement. NGT tip in mid stomach.  eICU Interventions  OK to use NGT.     Intervention Category Intermediate Interventions: Diagnostic test evaluation  Jaleiyah Alas Cornelia Copa 11/26/2015, 5:07 PM

## 2015-11-27 ENCOUNTER — Inpatient Hospital Stay (HOSPITAL_COMMUNITY): Payer: PPO

## 2015-11-27 DIAGNOSIS — I4891 Unspecified atrial fibrillation: Secondary | ICD-10-CM

## 2015-11-27 DIAGNOSIS — J9601 Acute respiratory failure with hypoxia: Secondary | ICD-10-CM

## 2015-11-27 DIAGNOSIS — I6789 Other cerebrovascular disease: Secondary | ICD-10-CM

## 2015-11-27 LAB — CBC
HEMATOCRIT: 24.7 % — AB (ref 39.0–52.0)
HEMOGLOBIN: 8.1 g/dL — AB (ref 13.0–17.0)
MCH: 26.3 pg (ref 26.0–34.0)
MCHC: 32.8 g/dL (ref 30.0–36.0)
MCV: 80.2 fL (ref 78.0–100.0)
Platelets: 113 10*3/uL — ABNORMAL LOW (ref 150–400)
RBC: 3.08 MIL/uL — ABNORMAL LOW (ref 4.22–5.81)
RDW: 15.9 % — AB (ref 11.5–15.5)
WBC: 21.3 10*3/uL — ABNORMAL HIGH (ref 4.0–10.5)

## 2015-11-27 LAB — TYPE AND SCREEN
ABO/RH(D): O POS
Antibody Screen: NEGATIVE
Unit division: 0
Unit division: 0

## 2015-11-27 LAB — RENAL FUNCTION PANEL
Albumin: 2.1 g/dL — ABNORMAL LOW (ref 3.5–5.0)
Anion gap: 10 (ref 5–15)
BUN: 16 mg/dL (ref 6–20)
CHLORIDE: 105 mmol/L (ref 101–111)
CO2: 22 mmol/L (ref 22–32)
CREATININE: 1.05 mg/dL (ref 0.61–1.24)
Calcium: 7.7 mg/dL — ABNORMAL LOW (ref 8.9–10.3)
GFR calc Af Amer: >60 mL/min (ref >60)
GLUCOSE: 138 mg/dL — AB (ref 65–99)
Phosphorus: 2.4 mg/dL — ABNORMAL LOW (ref 2.5–4.6)
Potassium: 3.9 mmol/L (ref 3.5–5.1)
Sodium: 137 mmol/L (ref 135–145)

## 2015-11-27 LAB — TRIGLYCERIDES: Triglycerides: 145 mg/dL (ref <150)

## 2015-11-27 LAB — GLUCOSE, CAPILLARY
GLUCOSE-CAPILLARY: 130 mg/dL — AB (ref 65–99)
Glucose-Capillary: 124 mg/dL — ABNORMAL HIGH (ref 65–99)
Glucose-Capillary: 106 mg/dL — ABNORMAL HIGH (ref 65–99)

## 2015-11-27 LAB — MAGNESIUM: Magnesium: 2 mg/dL (ref 1.7–2.4)

## 2015-11-27 MED ORDER — CHLORHEXIDINE GLUCONATE 0.12% ORAL RINSE (MEDLINE KIT)
15.0000 mL | Freq: Two times a day (BID) | OROMUCOSAL | Status: DC
  Administered 2015-11-28 (×2): 15 mL via OROMUCOSAL

## 2015-11-27 MED ORDER — VITAL HIGH PROTEIN PO LIQD
1000.0000 mL | ORAL | Status: DC
  Administered 2015-11-27: 1000 mL
  Administered 2015-11-28: 06:00:00
  Administered 2015-11-28: 1000 mL

## 2015-11-27 MED ORDER — HEPARIN SODIUM (PORCINE) 5000 UNIT/ML IJ SOLN
5000.0000 [IU] | Freq: Three times a day (TID) | INTRAMUSCULAR | Status: DC
  Administered 2015-11-27 – 2015-11-28 (×3): 5000 [IU] via SUBCUTANEOUS
  Filled 2015-11-27 (×4): qty 1

## 2015-11-27 MED ORDER — ANTISEPTIC ORAL RINSE SOLUTION (CORINZ)
7.0000 mL | OROMUCOSAL | Status: DC
  Administered 2015-11-28 (×7): 7 mL via OROMUCOSAL

## 2015-11-27 NOTE — Progress Notes (Signed)
STROKE TEAM PROGRESS NOTE   SUBJECTIVE (INTERVAL HISTORY) Daughter is at the bedside.  He is still intubated, off sedation today. slightly open eyes on voice but not following commands. MRI showed hemorrhagic transformation.   OBJECTIVE Temp:  [98.8 F (37.1 C)-101.1 F (38.4 C)] 99.3 F (37.4 C) (01/29 1200) Pulse Rate:  [92-120] 100 (01/29 1528) Cardiac Rhythm:  [-] Sinus tachycardia (01/29 0800) Resp:  [13-27] 15 (01/29 1528) BP: (120-168)/(53-78) 168/68 mmHg (01/29 1528) SpO2:  [100 %] 100 % (01/29 1528) FiO2 (%):  [40 %] 40 % (01/29 1528) Weight:  [191 lb 5.8 oz (86.8 kg)] 191 lb 5.8 oz (86.8 kg) (01/29 1200)  CBC:  Recent Labs Lab 11/23/2015 1001  11/26/15 0603 11/27/15 0415  WBC 9.8  < > 20.5* 21.3*  NEUTROABS 6.2  --  17.6*  --   HGB 10.8*  < > 9.7* 8.1*  HCT 34.7*  < > 28.7* 24.7*  MCV 78.7  < > 80.8 80.2  PLT 182  < > 149* 113*  < > = values in this interval not displayed.  Basic Metabolic Panel:   Recent Labs Lab 11/26/15 0603 11/27/15 0415  NA 139 137  K 4.4 3.9  CL 108 105  CO2 19* 22  GLUCOSE 133* 138*  BUN 19 16  CREATININE 1.30* 1.05  CALCIUM 7.6* 7.7*  MG 1.5* 2.0  PHOS 4.1 2.4*    Lipid Panel:     Component Value Date/Time   CHOL 112 11/26/2015 0603   TRIG 145 11/27/2015 0415   HDL 28* 11/26/2015 0603   CHOLHDL 4.0 11/26/2015 0603   VLDL 31 11/26/2015 0603   LDLCALC 53 11/26/2015 0603   HgbA1c: No results found for: HGBA1C Urine Drug Screen: No results found for: LABOPIA, COCAINSCRNUR, LABBENZ, AMPHETMU, THCU, LABBARB    IMAGING I have personally reviewed the radiological images below and agree with the radiology interpretations.  Ct Angio Head and Neck W/cm &/or Wo Cm 11/05/2015   1. Occlusion of the left middle cerebral artery at the bifurcation. No posterior branch vessels fill. There are poor collaterals.  2. Minimal opacification of anterior left MCA branch vessels.  3. Large posterior left MCA territory developing infarct  without hemorrhage.  4. Mild attenuation of anterior MCA branch vessels on the right.  5. Atherosclerotic calcifications at the aortic arch in carotid bifurcations bilaterally is well is the cavernous internal carotid arteries without significant stenoses.  6. Less than 50% stenosis of the left vertebral artery at the dural margin. Other atherosclerotic changes are present in the vertebral arteries bilaterally without significant stenoses.  7. Multilevel spondylosis of the cervical spine.  8. Emphysema and bilateral pleural effusions, right greater than left with associated atelectasis.   Ct Head Wo Contrast 11/08/2015   Extensive contrast staining in the left temporal and parietal lobes is consistent with reperfusion after interventional procedure. Ovoid collection of contrast the anterior left temporal lobe may represent more intense contrast staining but coexistent hemorrhage is possible. Evolution of left MCA territory infarct. Atrophy and chronic microvascular ischemic change.   Ct Head Wo Contrast 11/19/2015   1. No acute intracranial pathology.  2. Chronic microvascular disease and cerebral atrophy.   MRI of the brain without contrast - Acute infarction throughout the majority of the left middle cerebral artery territory as described above. Punctate acute infarctions elsewhere scattered within the cerebellum and both cerebral hemispheres. Mild swelling in the region of the left MCA infarction but without shift at this time. 1.5 x  3 cm hematoma in the anterior temporal lobe on the left with a 12 mm Sever's hematoma just lateral to that.  Cerebral angio S/P lt common carotid arteriogram,followed by complete revascularization of Lt MCA occlusion using x 2 passes with 71mm x 20 mm trevoprovue and x 1 pass with 4 mmx 40 mm solitaire FR device and 9 mg of superselective IA tpa with near complete revascularization.TICI 2b flow restored  Dg Chest Port 1 View 11/27/15 Endotracheal tube and  nasogastric tube in appropriate position. Mild bibasilar atelectasis, without significant change.  11/26/2015   Minimal bibasilar atelectasis.   11/24/2015   No acute cardiopulmonary disease. Appropriate position of endotracheal tube.   2D echo - - Left ventricle: The cavity size was normal. There was mild concentric hypertrophy. Systolic function was vigorous. The estimated ejection fraction was in the range of 65% to 70%. Wall motion was normal; there were no regional wall motion abnormalities. Features are consistent with a pseudonormal left ventricular filling pattern, with concomitant abnormal relaxation and increased filling pressure (grade 2 diastolic dysfunction). Doppler parameters are consistent with elevated ventricular end-diastolic filling pressure. - Aortic valve: A bioprosthetic valve sits well in the aortic position. There is no central aortic regurgitation or parvalvular leak. Transaortic gradients are normal and unchanged from prior. - Aortic root: The aortic root was normal in size. - Mitral valve: Severele calcified annulus. Mildly thickened leaflets . The findings are consistent with mild to moderate stenosis. There was mild regurgitation. Valve area by pressure half-time: 2.47 cm^2. - Left atrium: The atrium was moderately dilated. - Right ventricle: Systolic function was normal. - Right atrium: The atrium was mildly dilated. - Tricuspid valve: There was moderate regurgitation. - Pulmonary arteries: Systolic pressure was mildly increased. PA peak pressure: 37 mm Hg (S). - Inferior vena cava: The vessel was dilated. The respirophasic diameter changes were blunted (< 50%), consistent with elevated central venous pressure. - Pericardium, extracardiac: There was no pericardial effusion.   PHYSICAL EXAM  Temp:  [98.8 F (37.1 C)-101.1 F (38.4 C)] 99.3 F (37.4 C) (01/29 1200) Pulse Rate:  [92-120] 100 (01/29 1528) Resp:   [13-27] 15 (01/29 1528) BP: (120-168)/(53-78) 168/68 mmHg (01/29 1528) SpO2:  [100 %] 100 % (01/29 1528) FiO2 (%):  [40 %] 40 % (01/29 1528) Weight:  [191 lb 5.8 oz (86.8 kg)] 191 lb 5.8 oz (86.8 kg) (01/29 1200)  General - Well nourished, well developed, intubated, off sedation and pressor.  Ophthalmologic - Fundi not visualized due to small pupils.  Cardiovascular - Regular rhythm,  but intermittent tachycardia.  Neuro -  intubated, off sedation, slightly open eyes on voice, but not following commands. PERRL, mid position, doll's eyes positive, weak corneal bilaterally, weak gag reflex. Left upper extremity localizes to pain, left lower extremity withdraw to pain stimulation but not against gravity. Right upper extremity and lower extremity withdrawal to pain stimulation. Bilateral Babinski positive.  sensation, coordination or gait cannot be tested.  ASSESSMENT/PLAN [name] is a 82 y.o. male with history of CAD, HTN, afib not on AC, HLD, COPD, s/p right CEA, multiple falls, mental disorder, OSA, and gout presenting with acute onset of AMS, right-sided weakness and left gaze deviation. Received IV TPA and status post percutaneous thrombectomy.  Stroke:  Left MCA large infarct with hemorrhagic transformation, right MCA and ACA punctate infarcts, status post TPA and endovascular thrombectomy, due to A. fib not on AC.  Resultant  intubated, right hemiplegia  MRI - left MCA large infarct  with hemorrhagic transformation, right MCA and ACA punctate infarcts  CTA of head and neck -  left M1 occlusion.  2D Echo - EF 65-70%  LDL - 53  HgbA1c pending  Heparin subq for VTE prophylaxis Diet NPO time specified  aspirin 81 mg daily prior to admission, now on No antithrombotic secondary to hemorrhagic transformation   Patient counseled to be compliant with his antithrombotic medications  Ongoing aggressive stroke risk factor management  Therapy recommendations:  Pending  Disposition: Pending  A. fib RVR  s/p cardioversion  on amiodarone drip  On metoprolol  May consider cardiology consult if needed  Anemia   Hb 6.4 last night  Received PRBC  transfusion   Repeat Hb 9.7->8.1  CBC monnitoring  UTI  UA WBC TNTC  Leukocytosis WBC 24.9->21.3  On levaquin  Hypertension  Stable BP goal < 160 due to hemorrhagic transformation  Cardene drip when necessary  Other Stroke Risk Factors  Advanced age  Cigarette smoker, quit smoking.  Obstructive sleep apnea  Other Active Problems  Elevated triglycerides  Renal insufficiency  Leukocytosis  Mild thrombocytopenia  Gout   S/p right CEA  COPD   Hospital day # 2  This patient is critically ill due to left large MCA infarct with hemorrhagic transformation, A. fib RVR, respiratory failure and at significant risk of neurological worsening, death form  Recurrent stroke, enlargement of the hematoma, cerebral edema, brain herniation, heart failure. This patient's care requires constant monitoring of vital signs, hemodynamics, respiratory and cardiac monitoring, review of multiple databases, neurological assessment, discussion with family, other specialists and medical decision making of high complexity. I spent 40 minutes of neurocritical care time in the care of this patient.  I had long discussion with daughter at bedside. She stated that her father would not want aggressive measure with trach, PEG or NH placement. She would like to have decision made in a couple of days in light of pt progress.   Rosalin Hawking, MD PhD Stroke Neurology 11/27/2015 3:51 PM   To contact Stroke Continuity provider, please refer to [email or web address] After hours, contact General Neurology

## 2015-11-27 NOTE — Progress Notes (Signed)
SLP Cancellation Note  Patient Details Name: FURIOUS WRITER MRN: [id number] DOB: 1924/01/10   Cancelled treatment:       Reason Eval/Treat Not Completed: Patient not medically ready   Germain Osgood, M.A. CCC-SLP [phone]  Germain Osgood 11/27/2015, 8:28 AM

## 2015-11-27 NOTE — Progress Notes (Signed)
PULMONARY / CRITICAL CARE MEDICINE   Name: Logan Allen MRN: [id number] DOB: 05/22/1924    ADMISSION DATE:  11/28/2015 CONSULTATION DATE:  11/07/2015  REFERRING MD:  Dr. Silverio Decamp  CHIEF COMPLAINT:  CVA   HISTORY OF PRESENT ILLNESS:   82 y/o M with a PMH of HTN, CAD s/p CABG, carotid endarterectomy, prostate cancer (1998), OSA and COPD admitted 1/27 with dense L MCA stroke.  He received systemic TPA and IR thrombectomy.  He remained intubated post IR.     SUBJECTIVE/OVERNIGHT: No acute change overnight. Femoral sheath removed.  Minimal interaction off sedation.  Febrile.   VITAL SIGNS: BP 153/59 mmHg  Pulse 113  Temp(Src) 101.1 F (38.4 C) (Axillary)  Resp 13  Ht 5\' 9"  (1.753 m)  Wt 76.2 kg (167 lb 15.9 oz)  BMI 24.80 kg/m2  SpO2 100%  HEMODYNAMICS: CVP:  [5 mmHg-8 mmHg] 7 mmHg  VENTILATOR SETTINGS: Vent Mode:  [-] CPAP;PSV FiO2 (%):  [40 %] 40 % Set Rate:  [20 bmp] 20 bmp Vt Set:  [570 mL] 570 mL PEEP:  [5 cmH20] 5 cmH20 Pressure Support:  [5 cmH20] 5 cmH20 Plateau Pressure:  [12 cmH20-20 cmH20] 12 cmH20  INTAKE / OUTPUT: I/O last 3 completed shifts: In: 5251.7 [I.V.:4431.7; Blood:670; IV Piggyback:150] Out: [id number] [Urine:1455]  PHYSICAL EXAMINATION: General:  Chronically ill appearing, frail elder male on vent  Neuro:  Minimally responsive off sedation, Flaccid R, withdraws to pain on L, resists pupil exam  HEENT:  MM pink/moist, no jvd Cardiovascular:  s1s2 rrr, no m/r/g Lungs:  Even/non-labored pn PS 8/5, few scattered rhonchi, slightly diminished on L Abdomen:  Obese/soft, bsx4 active  Musculoskeletal:  No acute deformities  Skin:  Warm/dry, multiple areas of ecchymosis  LABS:  BMET  Recent Labs Lab 11/23/2015 1001 11/13/2015 1010 11/26/15 0603 11/27/15 0415  NA 140 141 139 137  K 4.2 4.1 4.4 3.9  CL 105 103 108 105  CO2 25  --  19* 22  BUN 16 18 19 16   CREATININE 1.13 1.00 1.30* 1.05  GLUCOSE 114* 113* 133* 138*    Electrolytes  Recent  Labs Lab 11/21/2015 1001 11/26/15 0603 11/27/15 0415  CALCIUM 9.1 7.6* 7.7*  MG  --  1.5* 2.0  PHOS  --  4.1 2.4*    CBC  Recent Labs Lab 11/19/2015 2108 11/26/15 0603 11/27/15 0415  WBC 24.9* 20.5* 21.3*  HGB 6.4* 9.7* 8.1*  HCT 20.4* 28.7* 24.7*  PLT 230 149* 113*    Coag's  Recent Labs Lab 11/24/2015 1001  APTT 33  INR 1.01    Sepsis Markers No results for input(s): LATICACIDVEN, PROCALCITON, O2SATVEN in the last 168 hours.  ABG  Recent Labs Lab 11/08/2015 1611 11/18/2015 2050 11/26/15 0350  PHART 7.377 7.236* 7.356  PCO2ART 35.7 33.0* 31.8*  PO2ART 409.0* 79.0* 155*    Liver Enzymes  Recent Labs Lab 11/07/2015 1001 11/27/15 0415  AST 21  --   ALT 10*  --   ALKPHOS 101  --   BILITOT 0.4  --   ALBUMIN 3.3* 2.1*    Cardiac Enzymes No results for input(s): TROPONINI, PROBNP in the last 168 hours.  Glucose  Recent Labs Lab 11/14/2015 1015 11/26/15 1222  GLUCAP 103* 115*    Imaging Mr Brain Wo Contrast  11/26/2015  CLINICAL DATA:  Followup stroke with subsequent intervention. EXAM: MRI HEAD WITHOUT CONTRAST TECHNIQUE: Multiplanar, multiecho pulse sequences of the brain and surrounding structures were obtained without intravenous contrast. COMPARISON:  Multiple CT  studies 11/14/2015 FINDINGS: Diffusion imaging shows a punctate focus of acute infarction in the left cerebellum. Punctate acute infarction affects the right caudate head. Few other punctate acute infarctions scattered within the right cerebral hemisphere. Confluent acute infarction throughout the majority of the left middle cerebral artery territory affecting the inferior frontal lobe, temporal lobe, insular region and parietal lobe. Some acute infarction in the caudate head. Partial involvement of the putamen. The area of infarction shows swelling with mild mass effect but no midline shift at this time. Focal hematoma in the left temporal tip is present with central liquid component. This measures  approximately 1.5 x 3 cm in size. Smaller second hematoma lateral to that in the lateral temporal lobe measuring 12 mm in diameter. No obstructive hydrocephalus. No extra-axial fluid collection. No evidence of neoplastic mass lesion. No significant sinus disease. Skull or skullbase appear unremarkable. IMPRESSION: Acute infarction throughout the majority of the left middle cerebral artery territory as described above. Punctate acute infarctions elsewhere scattered within the cerebellum and both cerebral hemispheres. Mild swelling in the region of the left MCA infarction but without shift at this time. 1.5 x 3 cm hematoma in the anterior temporal lobe on the left with a 12 mm Sever's hematoma just lateral to that. Electronically Signed   By: Nelson Chimes M.D.   On: 11/26/2015 11:33   Dg Abd Portable 1v  11/26/2015  CLINICAL DATA:  Status post gastric catheter placement EXAM: PORTABLE ABDOMEN - 1 VIEW COMPARISON:  None. FINDINGS: Gastric catheter is noted within the midportion of the stomach. Changes of aortic stent graft are noted. A left femoral central venous line is seen. Changes of prior vertebral augmentation are noted. IMPRESSION: Gastric catheter within the mid stomach. Electronically Signed   By: Inez Catalina M.D.   On: 11/26/2015 15:14     STUDIES:  1/27  CT Head >> no acute abnormality  1/27  CTA Head / Neck >> occlusion of L MCA 1/27  CT Head >> extensive contrast staining in L temporal & parietal lobes consistent with reperfusion after IR procedure, ovoid collection of contrast in anterior L temporal lobe, ? Of intense contrast staining vs coexistent hemorrhage, evolution of L MCA territory infarct  1/28 MRI brain >>>Acute infarction throughout the majority of the left middle cerebral artery territory.  Punctate acute infarctions elsewhere scattered within the cerebellum and both cerebral hemispheres. Mild swelling in the region of the left MCA infarction but without shift at this time. 1.5 x  3 cm hematoma in the anterior temporal lobe on the left with a 12 mm Sever's hematoma just lateral to that.  CULTURES: 1/27  UA >> TNTC wbc, many bacteria, 0-5 sq epithelials 1/27  UC >>   ANTIBIOTICS: Levaquin 1/27 (UTI) >>   SIGNIFICANT EVENTS: 1/27  Admit with AMS, aphasia, R sided weakness.    LINES/TUBES: OETT 1/27 >> R Fem Sheath 1/27 >> 1/28 L fem CVL 1/28>>>  DISCUSSION: 82 y/o M, SNF resident, admitted on 1/27 with altered mental status, aphasia and right sided weakness.  Work up consistent with MCA infarct, received TPA and patient taken to neuro-IR for revascularization.    ASSESSMENT / PLAN:  NEUROLOGIC A: MCA Infarct / Hemorrhage - s/p TPA + Neuro IR revascularization  Hx Arthritis, Frequent Falls, Chronic Low Back Pain, anxiety, dementia  P:   Keep off propofol as able  PRN Fentanyl for pain  Serial neuro exams Hold home klonopin, namenda, effexor  PULMONARY A: Acute Respiratory Failure secondary to  MCA CVA Hx OSA, COPD  P:   MV support, 8 cc/kg  Wean PEEP / FiO2 for sats 90-95% Duoneb Q6 with Q3 PRN albuterol  Daily SBT - ok for PS wean as tol - no extubation for now  F/u CXR   CARDIOVASCULAR A:  Hx CAD, HTN, HLD, RBBB AFib with RVR and hemodynamic instability -- s/p cardioversion 1/28 P:  PRN labetalol  Continue home metoprolol 1/28 No anticoagulation  Hold Pletal, ASA ICU monitoring of hemodynamics Echo pending   RENAL A:   AKI - mild  hypoMg  P:   Trend CBC / UOP  Replace electrolytes as indicated  Hold home oxybutynin NS @ 34ml/hr   GASTROINTESTINAL A:   At Risk Protein Calorie Malnutrition  P:   NPO  OGT  Add TF 1/29  HEMATOLOGIC A:   At Risk Bleeding s/p TPA  Anemia  P:  Trend CBC  SCD's for DVT prophylaxis   INFECTIOUS A:   UTI  P:   ABX and cultures as above Monitor fever curve / WBC  ENDOCRINE A:   Hypothyroidism - ? Hx of, noted in hx but not on medications according to med rec P:   Monitor     FAMILY  - Updates:  Daughter updated at length at bedside 1/29.  Mental status does not support extubation. She relayed that the pt would not want trach/peg/snf and really would not want to "pronlong" short term vent/ETT if there is not significant hope for improvement.  She was tearful but very reasonable and states "I had a feeling this is what was going to happen".  Pt was recently moved to assisted living where he seems to have been slowly declining over last 2 months.  Continue supportive care for now with ongoing family discussions and consider transition to comfort care in next 24-48 hours.   - Inter-disciplinary family meet or Palliative Care meeting due by:  2/4   Nickolas Madrid, NP 11/27/2015  9:29 AM Pager: [phone] or [phone]

## 2015-11-27 NOTE — Progress Notes (Signed)
PT Cancellation Note  Patient Details Name: Logan Allen MRN: [id number] DOB: [birth year]   Cancelled Treatment:    Reason Eval/Treat Not Completed: Patient not medically ready.    Joslyn Hy PT, DPT [phone] Pager: [phone] 11/27/2015, 8:42 AM

## 2015-11-28 ENCOUNTER — Inpatient Hospital Stay (HOSPITAL_COMMUNITY): Payer: PPO

## 2015-11-28 ENCOUNTER — Encounter (HOSPITAL_COMMUNITY): Payer: Self-pay | Admitting: Interventional Radiology

## 2015-11-28 DIAGNOSIS — D649 Anemia, unspecified: Secondary | ICD-10-CM

## 2015-11-28 DIAGNOSIS — D72829 Elevated white blood cell count, unspecified: Secondary | ICD-10-CM

## 2015-11-28 DIAGNOSIS — G8194 Hemiplegia, unspecified affecting left nondominant side: Secondary | ICD-10-CM

## 2015-11-28 LAB — RENAL FUNCTION PANEL
ANION GAP: 10 (ref 5–15)
Albumin: 2.1 g/dL — ABNORMAL LOW (ref 3.5–5.0)
BUN: 14 mg/dL (ref 6–20)
CHLORIDE: 107 mmol/L (ref 101–111)
CO2: 22 mmol/L (ref 22–32)
Calcium: 7.7 mg/dL — ABNORMAL LOW (ref 8.9–10.3)
Creatinine, Ser: 0.78 mg/dL (ref 0.61–1.24)
GFR calc Af Amer: >60 mL/min (ref >60)
GFR calc non Af Amer: >60 mL/min (ref >60)
GLUCOSE: 144 mg/dL — AB (ref 65–99)
PHOSPHORUS: 1.5 mg/dL — AB (ref 2.5–4.6)
POTASSIUM: 3.3 mmol/L — AB (ref 3.5–5.1)
Sodium: 139 mmol/L (ref 135–145)

## 2015-11-28 LAB — GLUCOSE, CAPILLARY
GLUCOSE-CAPILLARY: 122 mg/dL — AB (ref 65–99)
Glucose-Capillary: 108 mg/dL — ABNORMAL HIGH (ref 65–99)
Glucose-Capillary: 124 mg/dL — ABNORMAL HIGH (ref 65–99)
Glucose-Capillary: 129 mg/dL — ABNORMAL HIGH (ref 65–99)

## 2015-11-28 LAB — CBC
HEMATOCRIT: 22.5 % — AB (ref 39.0–52.0)
HEMOGLOBIN: 7.6 g/dL — AB (ref 13.0–17.0)
MCH: 27.1 pg (ref 26.0–34.0)
MCHC: 33.8 g/dL (ref 30.0–36.0)
MCV: 80.4 fL (ref 78.0–100.0)
Platelets: ADEQUATE 10*3/uL (ref 150–400)
RBC: 2.8 MIL/uL — ABNORMAL LOW (ref 4.22–5.81)
RDW: 15.9 % — AB (ref 11.5–15.5)
WBC: 18.6 10*3/uL — ABNORMAL HIGH (ref 4.0–10.5)

## 2015-11-28 LAB — HEMOGLOBIN A1C
HEMOGLOBIN A1C: 5.9 % — AB (ref 4.8–5.6)
MEAN PLASMA GLUCOSE: 123 mg/dL

## 2015-11-28 LAB — TROPONIN I
TROPONIN I: 0.3 ng/mL — AB (ref <0.031)
TROPONIN I: 0.08 ng/mL — AB (ref <0.031)

## 2015-11-28 LAB — MAGNESIUM: Magnesium: 2.1 mg/dL (ref 1.7–2.4)

## 2015-11-28 LAB — TRIGLYCERIDES: Triglycerides: 84 mg/dL (ref <150)

## 2015-11-28 MED ORDER — POTASSIUM CHLORIDE 20 MEQ/15ML (10%) PO SOLN
40.0000 meq | ORAL | Status: AC
  Administered 2015-11-28 (×2): 40 meq via ORAL
  Filled 2015-11-28 (×2): qty 30

## 2015-11-28 MED ORDER — POTASSIUM PHOSPHATE MONOBASIC 500 MG PO TABS
500.0000 mg | ORAL_TABLET | Freq: Three times a day (TID) | ORAL | Status: DC
  Filled 2015-11-28 (×4): qty 1

## 2015-11-28 MED ORDER — VITAL AF 1.2 CAL PO LIQD
1000.0000 mL | ORAL | Status: DC
  Administered 2015-11-28: 1000 mL

## 2015-11-28 MED ORDER — METOPROLOL TARTRATE 25 MG/10 ML ORAL SUSPENSION
25.0000 mg | Freq: Two times a day (BID) | ORAL | Status: DC
  Administered 2015-11-28 (×2): 25 mg
  Filled 2015-11-28 (×2): qty 10

## 2015-11-28 MED ORDER — HYDRALAZINE HCL 20 MG/ML IJ SOLN
10.0000 mg | INTRAMUSCULAR | Status: DC | PRN
  Administered 2015-11-28 – 2015-11-29 (×2): 10 mg via INTRAVENOUS
  Filled 2015-11-28 (×2): qty 1

## 2015-11-28 MED ORDER — K PHOS MONO-SOD PHOS DI & MONO 155-852-130 MG PO TABS
500.0000 mg | ORAL_TABLET | Freq: Three times a day (TID) | ORAL | Status: DC
  Administered 2015-11-28 (×3): 500 mg via ORAL
  Filled 2015-11-28 (×8): qty 2

## 2015-11-28 MED ORDER — POTASSIUM CHLORIDE CRYS ER 20 MEQ PO TBCR: 40.0000 meq | EXTENDED_RELEASE_TABLET | ORAL | Status: DC

## 2015-11-28 MED ORDER — ANTISEPTIC ORAL RINSE SOLUTION (CORINZ)
7.0000 mL | Freq: Four times a day (QID) | OROMUCOSAL | Status: DC
  Administered 2015-11-28 – 2015-11-29 (×3): 7 mL via OROMUCOSAL

## 2015-11-28 NOTE — Progress Notes (Signed)
SLP Cancellation Note  Patient Details Name: DEONDRAE DEMPEWOLF MRN: [id number] DOB: [birth year]   Cancelled treatment:       Reason Eval/Treat Not Completed: Patient not medically ready   Geneen Dieter, Katherene Ponto 11/28/2015, 7:41 AM

## 2015-11-28 NOTE — Clinical Documentation Improvement (Signed)
Critical Care  Can the diagnosis of Atrial Fibrillation be further specified? Document finding in next progress note NOT in BPA drop down box. Thanks!   Chronic Atrial fibrillation  Paroxysmal Atrial fibrillation  Permanent Atrial fibrillation  Persistent Atrial fibrillation  Other  Clinically Undetermined  Document any associated diagnoses/conditions.  Supporting Information:  Please exercise your independent, professional judgment when responding. A specific answer is not anticipated or expected.  Thank You,  Zoila Shutter RN, BSN, [place] [phone]

## 2015-11-28 NOTE — Clinical Documentation Improvement (Signed)
Critical Care  Can the diagnosis of anemia be further specified? Document response in next progress note NOT in BPA drop down box. Thanks!   Iron deficiency Anemia  Acute Blood Loss Anemia  Acute on Chronic Blood Loss Anemia  Chronic Blood Loss Anemia, including the suspected or known cause  Anemia of chronic disease, including the associated chronic disease state  Other  Clinically Undetermined  Document any associated diagnoses/conditions.  Supporting Information:  Hgb dropped to 6.4  Transfused with PRBC's  Repeat draw Hgb = 9.7  Please exercise your independent, professional judgment when responding. A specific answer is not anticipated or expected.  Thank You,  Zoila Shutter RN, BSN, [place] [phone]

## 2015-11-28 NOTE — Progress Notes (Signed)
OT Cancellation Note  Patient Details Name: Logan Allen MRN: [id number] DOB: [birth year]   Cancelled Treatment:    Reason Eval/Treat Not Completed: Patient not medically ready Pt on bedrest. Please update activity orders when appropriate for therapy. Thanks [place], OTR/L  [phone] 11/28/2015 11/28/2015, 10:14 AM

## 2015-11-28 NOTE — Progress Notes (Signed)
PT Cancellation Note  Patient Details Name: MARSHELL ROTENBERG MRN: [id number] DOB: [birth year]   Cancelled Treatment:    Reason Eval/Treat Not Completed: Patient not medically ready.  Pt continues to be on strict bedrest and noted possibility of transitioning to comfort approach.  Please advise if pt is appropriate for PT and mobility at this time.     Minyon Billiter, Thornton Papas 11/28/2015, 8:25 AM

## 2015-11-28 NOTE — Progress Notes (Signed)
STROKE TEAM PROGRESS NOTE   SUBJECTIVE (INTERVAL HISTORY) Daughter and son in law are at the bedside.  He is still intubated, off sedation. Able to pen eyes on voice but not following commands, still very lethargic, gag intermittently. MRI showed hemorrhagic transformation with left large MCA infarct. HR and BP were high overnight. Given hydralazine.   OBJECTIVE Temp:  [98.2 F (36.8 C)-99.4 F (37.4 C)] 98.6 F (37 C) (01/30 1200) Pulse Rate:  [90-125] 100 (01/30 1300) Cardiac Rhythm:  [-] Sinus tachycardia (01/30 0800) Resp:  [2-27] 23 (01/30 1300) BP: (131-177)/(53-76) 131/54 mmHg (01/30 1300) SpO2:  [99 %-100 %] 100 % (01/30 1342) FiO2 (%):  [30 %-40 %] 30 % (01/30 1342) Weight:  [198 lb 3.1 oz (89.9 kg)] 198 lb 3.1 oz (89.9 kg) (01/30 0500)  CBC:  Recent Labs Lab 11/18/2015 1001  11/26/15 0603 11/27/15 0415 11/28/15 0555  WBC 9.8  < > 20.5* 21.3* 18.6*  NEUTROABS 6.2  --  17.6*  --   --   HGB 10.8*  < > 9.7* 8.1* 7.6*  HCT 34.7*  < > 28.7* 24.7* 22.5*  MCV 78.7  < > 80.8 80.2 80.4  PLT 182  < > 149* 113* PLATELET CLUMPS NOTED ON SMEAR, COUNT APPEARS ADEQUATE  < > = values in this interval not displayed.  Basic Metabolic Panel:   Recent Labs Lab 11/27/15 0415 11/28/15 0555  NA 137 139  K 3.9 3.3*  CL 105 107  CO2 22 22  GLUCOSE 138* 144*  BUN 16 14  CREATININE 1.05 0.78  CALCIUM 7.7* 7.7*  MG 2.0 2.1  PHOS 2.4* 1.5*    Lipid Panel:     Component Value Date/Time   CHOL 112 11/26/2015 0603   TRIG 84 11/28/2015 0555   HDL 28* 11/26/2015 0603   CHOLHDL 4.0 11/26/2015 0603   VLDL 31 11/26/2015 0603   LDLCALC 53 11/26/2015 0603   HgbA1c:  Lab Results  Component Value Date   HGBA1C 5.9* 11/26/2015   Urine Drug Screen: No results found for: LABOPIA, COCAINSCRNUR, LABBENZ, AMPHETMU, THCU, LABBARB    IMAGING I have personally reviewed the radiological images below and agree with the radiology interpretations.  Ct Angio Head and Neck W/cm &/or Wo  Cm 11/07/2015   1. Occlusion of the left middle cerebral artery at the bifurcation. No posterior branch vessels fill. There are poor collaterals.  2. Minimal opacification of anterior left MCA branch vessels.  3. Large posterior left MCA territory developing infarct without hemorrhage.  4. Mild attenuation of anterior MCA branch vessels on the right.  5. Atherosclerotic calcifications at the aortic arch in carotid bifurcations bilaterally is well is the cavernous internal carotid arteries without significant stenoses.  6. Less than 50% stenosis of the left vertebral artery at the dural margin. Other atherosclerotic changes are present in the vertebral arteries bilaterally without significant stenoses.  7. Multilevel spondylosis of the cervical spine.  8. Emphysema and bilateral pleural effusions, right greater than left with associated atelectasis.   Ct Head Wo Contrast 11/17/2015   Extensive contrast staining in the left temporal and parietal lobes is consistent with reperfusion after interventional procedure. Ovoid collection of contrast the anterior left temporal lobe may represent more intense contrast staining but coexistent hemorrhage is possible. Evolution of left MCA territory infarct. Atrophy and chronic microvascular ischemic change.   Ct Head Wo Contrast 11/10/2015   1. No acute intracranial pathology.  2. Chronic microvascular disease and cerebral atrophy.   11/28/2015  IMPRESSION: Large left middle cerebral artery distribution infarct with progressive swelling. Associated hemorrhage most notable left temporal lobe, left operculum region and within the left sylvian fissure appears grossly similar to the MR and improved from the prior CT. 5.4 mm midline shift to the right versus prior 3 mm midline shift.   MRI of the brain without contrast - Acute infarction throughout the majority of the left middle cerebral artery territory as described above. Punctate acute infarctions elsewhere  scattered within the cerebellum and both cerebral hemispheres. Mild swelling in the region of the left MCA infarction but without shift at this time. 1.5 x 3 cm hematoma in the anterior temporal lobe on the left with a 12 mm Sever's hematoma just lateral to that.  Cerebral angio S/P lt common carotid arteriogram,followed by complete revascularization of Lt MCA occlusion using x 2 passes with 63mm x 20 mm trevoprovue and x 1 pass with 4 mmx 40 mm solitaire FR device and 9 mg of superselective IA tpa with near complete revascularization.TICI 2b flow restored  Dg Chest Port 1 View 11/27/15 Endotracheal tube and nasogastric tube in appropriate position. Mild bibasilar atelectasis, without significant change.  11/26/2015   Minimal bibasilar atelectasis.   11/12/2015   No acute cardiopulmonary disease. Appropriate position of endotracheal tube.   2D echo - - Left ventricle: The cavity size was normal. There was mild concentric hypertrophy. Systolic function was vigorous. The estimated ejection fraction was in the range of 65% to 70%. Wall motion was normal; there were no regional wall motion abnormalities. Features are consistent with a pseudonormal left ventricular filling pattern, with concomitant abnormal relaxation and increased filling pressure (grade 2 diastolic dysfunction). Doppler parameters are consistent with elevated ventricular end-diastolic filling pressure. - Aortic valve: A bioprosthetic valve sits well in the aortic position. There is no central aortic regurgitation or parvalvular leak. Transaortic gradients are normal and unchanged from prior. - Aortic root: The aortic root was normal in size. - Mitral valve: Severele calcified annulus. Mildly thickened leaflets . The findings are consistent with mild to moderate stenosis. There was mild regurgitation. Valve area by pressure half-time: 2.47 cm^2. - Left atrium: The atrium was moderately  dilated. - Right ventricle: Systolic function was normal. - Right atrium: The atrium was mildly dilated. - Tricuspid valve: There was moderate regurgitation. - Pulmonary arteries: Systolic pressure was mildly increased. PA peak pressure: 37 mm Hg (S). - Inferior vena cava: The vessel was dilated. The respirophasic diameter changes were blunted (< 50%), consistent with elevated central venous pressure. - Pericardium, extracardiac: There was no pericardial effusion.   PHYSICAL EXAM  Temp:  [98.2 F (36.8 C)-99.4 F (37.4 C)] 98.6 F (37 C) (01/30 1200) Pulse Rate:  [90-125] 100 (01/30 1300) Resp:  [2-27] 23 (01/30 1300) BP: (131-177)/(53-76) 131/54 mmHg (01/30 1300) SpO2:  [99 %-100 %] 100 % (01/30 1342) FiO2 (%):  [30 %-40 %] 30 % (01/30 1342) Weight:  [198 lb 3.1 oz (89.9 kg)] 198 lb 3.1 oz (89.9 kg) (01/30 0500)  General - Well nourished, well developed, intubated, off sedation and pressor.  Ophthalmologic - Fundi not visualized due to small pupils.  Cardiovascular - Regular rhythm,  but tachycardia.  Neuro -  intubated, off sedation, briefly open eyes on voice, but not following commands. PERRL, mid position, doll's eyes positive, weak corneal bilaterally, positive gag reflex. Left upper extremity localizes to pain, left lower extremity withdraw to pain stimulation but not against gravity. Right upper extremity extension to pain and lower  extremity mild withdrawal to pain stimulation. Bilateral Babinski positive.  sensation, coordination or gait cannot be tested.  ASSESSMENT/PLAN Logan Allen is a 82 y.o. male with history of CAD, HTN, afib not on AC, HLD, COPD, s/p right CEA, multiple falls, mental disorder, OSA, and gout presenting with acute onset of AMS, right-sided weakness and left gaze deviation. Received IV TPA and status post percutaneous thrombectomy.  Stroke:  Left MCA large infarct with hemorrhagic transformation, right MCA and ACA punctate infarcts,  status post TPA and endovascular thrombectomy, due to A. fib not on AC.  Resultant  intubated, right hemiplegia  MRI - left MCA large infarct with hemorrhagic transformation, right MCA and ACA punctate infarcts  CTA of head and neck -  left M1 occlusion.  Repeat CT to evaluate brain edema.  2D Echo - EF 65-70%  LDL - 53  HgbA1c 5.9  Heparin subq for VTE prophylaxis Diet NPO time specified  aspirin 81 mg daily prior to admission, now on No antithrombotic secondary to hemorrhagic transformation.   Patient counseled to be compliant with his antithrombotic medications  Ongoing aggressive stroke risk factor management  Therapy recommendations: Pending  Disposition: Pending  A. fib RVR  s/p cardioversion  on amiodarone drip  Increase metoprolol dose to 25mg  bid  May consider cardiology consult if needed  Anemia   Hb 6.4 last night  Received PRBC  transfusion   Repeat Hb 9.7->8.1->7.6  CBC monnitoring  UTI  UA WBC TNTC  Leukocytosis WBC 24.9->21.3->18.6  On levaquin  Hypertension  Stable BP goal < 160 due to hemorrhagic transformation  Cardene drip when necessary  Other Stroke Risk Factors  Advanced age  Cigarette smoker, quit smoking.  Obstructive sleep apnea  S/p right CEA  Other Active Problems  Elevated triglycerides  Hypokalemia - supplement  Gout   COPD   Hospital day # 3  This patient is critically ill due to left large MCA infarct with hemorrhagic transformation, A. fib RVR, respiratory failure and at significant risk of neurological worsening, death form  Recurrent stroke, enlargement of the hematoma, cerebral edema, brain herniation, heart failure. This patient's care requires constant monitoring of vital signs, hemodynamics, respiratory and cardiac monitoring, review of multiple databases, neurological assessment, discussion with family, other specialists and medical decision making of high complexity. I spent 40 minutes of  neurocritical care time in the care of this patient.  I had long discussion with daughter and son in law at bedside. She stated that her father would not want aggressive measure with trach, PEG or NH placement. Will discuss again after CT repeat today.   Rosalin Hawking, MD PhD Stroke Neurology 11/28/2015 1:52 PM   To contact Stroke Continuity provider, please refer to [email or web address] After hours, contact General Neurology

## 2015-11-28 NOTE — Progress Notes (Signed)
eLink Physician-Brief Progress Note Patient Name: Logan Allen DOB: 1924/10/15 MRN: [id number]   Date of Service  11/28/2015  HPI/Events of Note  Having Fequent pauses on bedside monitor. EKG - Difficult to determine underlying rhythm. Old RBBB. Patient has received Labetalol X 3 this evening for HTN.   eICU Interventions  Will order: 1. Cycle Troponin. 2. BMP and MG++ level. 3. D/C Labetalol IV PRN. 4. Hydralazine 10 mg IV Q 4 hours PRN SBP > 170.     Intervention Category Major Interventions: Arrhythmia - evaluation and management  Sommer,Steven Eugene 11/28/2015, 5:36 AM

## 2015-11-28 NOTE — Clinical Documentation Improvement (Signed)
Critical Care  Can the diagnosis of Shock be further specified? Documented as indication for central line placement 11/26/15. Please document findings in next progress note NOT in BPA drop down box.   Shock - Cardiogenic, Hypovolemic, Neurogenic   Other Type -  including suspected or known cause and/or associated condition(s)  Clinically Undetermined  Document any associated diagnoses/conditions.  Supporting Information:  Left MCA large infarct with hemorrhagic transformation   Please exercise your independent, professional judgment when responding. A specific answer is not anticipated or expected.  Thank You,  Zoila Shutter RN, BSN, [place] [phone]

## 2015-11-28 NOTE — Plan of Care (Signed)
Daughter was explained about the today's CT result, previous MRI images, current diagnosis, current plan and prognosis. She would like comfort care for pt given the nature of the condition is serious and poor prognosis. As per daughter, pt has expressed in the past that if he suffers a devastating injury with no chance of meaningful recovery or is in a terminal condition then he wanted comfort care. As per daughter wishes, we will initiate withdraw of care and comfort care tomorrow. I agree with DNR and comfort care. DNR ordered.   Rosalin Hawking, MD PhD Stroke Neurology 11/28/2015 2:32 PM

## 2015-11-28 NOTE — Progress Notes (Signed)
Nutrition Follow-up / Consult  DOCUMENTATION CODES:   Not applicable  INTERVENTION:    Change TF formula to Vital AF 1.2 at 40 ml/h, increase by 10 ml every 4 hours to goal rate of 60 ml/h to provide 1728 kcals, 108 gm protein, 779 ml free water daily.  NUTRITION DIAGNOSIS:   Inadequate oral intake related to inability to eat as evidenced by NPO status.  Ongoing  GOAL:   Patient will meet greater than or equal to 90% of their needs  Unmet, Progressing  MONITOR:   Vent status, Labs, Weight trends, TF tolerance, I & O's  REASON FOR ASSESSMENT:   Consult Enteral/tube feeding initiation and management  ASSESSMENT:   82 y/o M with a PMH of HTN, CAD s/p CABG, carotid endarterectomy, prostate cancer (1998), OSA and COPD admitted 1/27 with dense L MCA stroke. He received systemic TPA and IR thrombectomy. He remained intubated post IR.   Labs reviewed: potassium and phosphorus are low. Noted possibility of transition to comfort care if no improvement. Patient would not want trach/PEG per MD notes. Received MD Consult for TF initiation and management. Adult tube feeding protocol initiated 1/29. Patient is currently receiving Vital High Protein via OGT at 40 ml/h (960 ml/day) to provide 960 kcals, 84 gm protein, 803 ml free water daily.   Patient is currently intubated on ventilator support.  MV: 12.8 L/min Temp (24hrs), Avg:98.8 F (37.1 C), Min:98.2 F (36.8 C), Max:99.4 F (37.4 C)   Diet Order:  Diet NPO time specified  Skin:  Reviewed, no issues  Last BM:  Unknown  Height:   Ht Readings from Last 1 Encounters:  11/28/2015 5\' 9"  (1.753 m)    Weight:   Wt Readings from Last 1 Encounters:  11/28/15 198 lb 3.1 oz (89.9 kg)   11/18/2015 167 lb 15.9 oz (76.2 kg)        Ideal Body Weight:  72.73 kg  BMI:  Body mass index is 29.25 kg/(m^2).  Estimated Nutritional Needs:   Kcal:  [id number]  Protein:  100-120 gm  Fluid:  1.8 L  EDUCATION NEEDS:   No  education needs identified at this time  Molli Barrows, [place], [place], [place] Pager [phone] After Hours Pager [phone]

## 2015-11-28 NOTE — Progress Notes (Signed)
PULMONARY / CRITICAL CARE MEDICINE   Name: Logan Allen MRN: [id number] DOB: [birth year]    ADMISSION DATE:  10/31/2015 CONSULTATION DATE:  11/07/2015  REFERRING MD:  Dr. Silverio Decamp  CHIEF COMPLAINT:  CVA   HISTORY OF PRESENT ILLNESS:   82 y/o M with a PMH of HTN, CAD s/p CABG, carotid endarterectomy, prostate cancer (1998), OSA and COPD admitted 1/27 with dense L MCA stroke.  He received systemic TPA and IR thrombectomy.  He remained intubated post IR.     SUBJECTIVE/OVERNIGHT: Sinus pauses noted. Labetelol changed to hydralazine.  VITAL SIGNS: BP 150/59 mmHg  Pulse 124  Temp(Src) 98.2 F (36.8 C) (Axillary)  Resp 25  Ht 5\' 9"  (1.753 m)  Wt 198 lb 3.1 oz (89.9 kg)  BMI 29.25 kg/m2  SpO2 100%  HEMODYNAMICS: CVP:  [3 mmHg-15 mmHg] 6 mmHg  VENTILATOR SETTINGS: Vent Mode:  [-] PRVC FiO2 (%):  [30 %-40 %] 30 % Set Rate:  [20 bmp] 20 bmp Vt Set:  [570 mL] 570 mL PEEP:  [5 cmH20] 5 cmH20 Pressure Support:  [5 cmH20-8 cmH20] 5 cmH20 Plateau Pressure:  [10 cmH20-17 cmH20] 10 cmH20  INTAKE / OUTPUT: I/O last 3 completed shifts: In: 4047.6 [I.V.:3417.6; NG/GT:530; IV Piggyback:100] Out: [id number] [Urine:1605]  PHYSICAL EXAMINATION: General:  Chronically ill appearing, frail elder male on vent  Neuro:  Minimally responsive, Flaccid R, withdraws to pain on L. HEENT:  Moist mucus membranes. No JVD, thyromegaly Cardiovascular:  S1, S2, No MRG Lungs:  Clear, no wheeze, crackles. Abdomen:  Soft, + BS Musculoskeletal:  No acute deformities  Skin: Intact  LABS:  BMET  Recent Labs Lab 11/26/15 0603 11/27/15 0415 11/28/15 0555  NA 139 137 139  K 4.4 3.9 3.3*  CL 108 105 107  CO2 19* 22 22  BUN 19 16 14   CREATININE 1.30* 1.05 0.78  GLUCOSE 133* 138* 144*    Electrolytes  Recent Labs Lab 11/26/15 0603 11/27/15 0415 11/28/15 0555  CALCIUM 7.6* 7.7* 7.7*  MG 1.5* 2.0 2.1  PHOS 4.1 2.4* 1.5*    CBC  Recent Labs Lab 11/26/15 0603 11/27/15 0415 11/28/15 0555   WBC 20.5* 21.3* 18.6*  HGB 9.7* 8.1* 7.6*  HCT 28.7* 24.7* 22.5*  PLT 149* 113* PLATELET CLUMPS NOTED ON SMEAR, COUNT APPEARS ADEQUATE    Coag's  Recent Labs Lab 11/13/2015 1001  APTT 33  INR 1.01    Sepsis Markers No results for input(s): LATICACIDVEN, PROCALCITON, O2SATVEN in the last 168 hours.  ABG  Recent Labs Lab 11/02/2015 1611 11/03/2015 2050 11/26/15 0350  PHART 7.377 7.236* 7.356  PCO2ART 35.7 33.0* 31.8*  PO2ART 409.0* 79.0* 155*    Liver Enzymes  Recent Labs Lab 11/09/2015 1001 11/27/15 0415 11/28/15 0555  AST 21  --   --   ALT 10*  --   --   ALKPHOS 101  --   --   BILITOT 0.4  --   --   ALBUMIN 3.3* 2.1* 2.1*    Cardiac Enzymes  Recent Labs Lab 11/28/15 0555  TROPONINI 0.30*    Glucose  Recent Labs Lab 11/27/15 1228 11/27/15 1556 11/27/15 2023 11/28/15 0018 11/28/15 0354 11/28/15 0810  GLUCAP 124* 130* 106* 129* 122* 124*    Imaging Dg Chest Port 1 View  11/27/2015  CLINICAL DATA:  Acute respiratory failure. On ventilator. Increased secretions from endotracheal tube. Coronary artery disease. EXAM: PORTABLE CHEST 1 VIEW COMPARISON:  11/26/2015 FINDINGS: Endotracheal tube remains in appropriate position with tip approximately 4.5 cm  above the carina. New nasogastric tube is seen within the stomach. Mild bibasilar atelectasis is unchanged. No evidence of pulmonary consolidation or edema. Heart size remains within normal limits. Prior CABG again noted. IMPRESSION: Endotracheal tube and nasogastric tube in appropriate position. Mild bibasilar atelectasis, without significant change. Electronically Signed   By: Earle Gell M.D.   On: 11/27/2015 15:01     STUDIES:  1/27  CT Head >> no acute abnormality  1/27  CTA Head / Neck >> occlusion of L MCA 1/27  CT Head >> extensive contrast staining in L temporal & parietal lobes consistent with reperfusion after IR procedure, ovoid collection of contrast in anterior L temporal lobe, ? Of intense  contrast staining vs coexistent hemorrhage, evolution of L MCA territory infarct  1/28 MRI brain >>>Acute infarction throughout the majority of the left middle cerebral artery territory.  Punctate acute infarctions elsewhere scattered within the cerebellum and both cerebral hemispheres. Mild swelling in the region of the left MCA infarction but without shift at this time. 1.5 x 3 cm hematoma in the anterior temporal lobe on the left with a 12 mm Sever's hematoma just lateral to that.  CULTURES: 1/27  UA >> TNTC wbc, many bacteria, 0-5 sq epithelials 1/27  UC >>   ANTIBIOTICS: Levaquin 1/27 (UTI) >>   SIGNIFICANT EVENTS: 1/27  Admit with AMS, aphasia, R sided weakness.    LINES/TUBES: OETT 1/27 >> R Fem Sheath 1/27 >> 1/28 L fem CVL 1/28>>>  DISCUSSION: 82 y/o M, SNF resident, admitted on 1/27 with altered mental status, aphasia and right sided weakness.  Work up consistent with MCA infarct, received TPA and patient taken to neuro-IR for revascularization.    ASSESSMENT / PLAN:  NEUROLOGIC A: MCA Infarct / Hemorrhage - s/p TPA + Neuro IR revascularization  Hx Arthritis, Frequent Falls, Chronic Low Back Pain, anxiety, dementia  P:   Keep off propofol as able  PRN Fentanyl for pain  Serial neuro exams Hold home klonopin, namenda, effexor  PULMONARY A: Acute Respiratory Failure secondary to MCA CVA Hx OSA, COPD  P:   MV support, 8 cc/kg  Wean PEEP / FiO2 for sats 90-95% Duoneb Q6 with Q3 PRN albuterol  Daily SBT - ok for PS wean as tol - no extubation for now   CARDIOVASCULAR A:  Hx CAD, HTN, HLD, RBBB AFib with RVR and hemodynamic instability -- s/p cardioversion 1/28 Sinus pauses P:  PRN hydralazine Follow troponins Hold home metoprolol 1/28 No anticoagulation  Hold Pletal, ASA ICU monitoring of hemodynamics  RENAL A:   AKI - mild  hypoMg  P:   Trend CBC / UOP  Replace electrolytes as indicated  Hold home oxybutynin NS @ 83ml/hr  GASTROINTESTINAL A:    At Risk Protein Calorie Malnutrition  P:   NPO  OGT  Add TF 1/29  HEMATOLOGIC A:   At Risk Bleeding s/p TPA  Anemia  P:  Trend CBC  SCD's for DVT prophylaxis   INFECTIOUS A:   UTI  P:   ABX and cultures as above Monitor fever curve / WBC  ENDOCRINE A:   Hypothyroidism - ? Hx of, noted in hx but not on medications according to med rec P:   Monitor    FAMILY  - Updates:  Daughter updated 1/30.  Mental status does not support extubation. She relayed that the pt would not want trach/peg/snf and really would not want to "pronlong" short term vent/ETT if there is not significant hope for improvement.  She was tearful but very reasonable and states "I had a feeling this is what was going to happen".  Pt was recently moved to assisted living where he seems to have been slowly declining over last 2 months.  Continue supportive care for now with ongoing family discussions and consider transition to comfort care in next 24-48 hours after repeat CT head. - Inter-disciplinary family meet or Palliative Care meeting due by:  2/4  Critical care time- 35 mins.  Marshell Garfinkel MD [facility] Pager [phone] If no answer or after 3pm call: [phone] 11/28/2015, 10:04 AM

## 2015-11-28 NOTE — Care Management Important Message (Signed)
Important Message  Patient Details  Name: Logan Allen MRN: [id number] Date of Birth: 14-Dec-1923   Medicare Important Message Given:  Yes    Nathen May 11/28/2015, 2:14 PM

## 2015-11-29 MED ORDER — GLYCOPYRROLATE 0.2 MG/ML IJ SOLN: 0.2000 mg | INTRAMUSCULAR | Status: DC | PRN

## 2015-11-29 MED ORDER — BIOTENE DRY MOUTH MT LIQD: 15.0000 mL | OROMUCOSAL | Status: DC | PRN

## 2015-11-29 MED ORDER — GLYCOPYRROLATE 0.2 MG/ML IJ SOLN
0.2000 mg | INTRAMUSCULAR | Status: DC | PRN
  Administered 2015-11-29: 0.2 mg via INTRAVENOUS
  Filled 2015-11-29: qty 1

## 2015-11-29 MED ORDER — GLYCOPYRROLATE 1 MG PO TABS
1.0000 mg | ORAL_TABLET | ORAL | Status: DC | PRN
  Filled 2015-11-29: qty 1

## 2015-11-29 MED ORDER — HALOPERIDOL LACTATE 5 MG/ML IJ SOLN: 0.5000 mg | INTRAMUSCULAR | Status: DC | PRN

## 2015-11-29 MED ORDER — POLYVINYL ALCOHOL 1.4 % OP SOLN: 1.0000 [drp] | Freq: Four times a day (QID) | OPHTHALMIC | Status: DC | PRN

## 2015-11-29 MED ORDER — HALOPERIDOL LACTATE 2 MG/ML PO CONC
0.5000 mg | ORAL | Status: DC | PRN
  Filled 2015-11-29: qty 0.3

## 2015-11-29 MED ORDER — HALOPERIDOL 0.5 MG PO TABS
0.5000 mg | ORAL_TABLET | ORAL | Status: DC | PRN
  Filled 2015-11-29: qty 1

## 2015-11-29 MED ORDER — MORPHINE SULFATE 25 MG/ML IV SOLN
1.0000 mg/h | INTRAVENOUS | Status: DC
  Administered 2015-11-29: 2 mg/h via INTRAVENOUS
  Filled 2015-11-29: qty 10

## 2015-11-29 MED ORDER — MORPHINE BOLUS VIA INFUSION
2.0000 mg | INTRAVENOUS | Status: DC | PRN
  Filled 2015-11-29: qty 2

## 2015-11-30 NOTE — Progress Notes (Signed)
PT Cancellation Note  Patient Details Name: Logan Allen MRN: [id number] DOB: 05-May-1924   Cancelled Treatment:    Reason Eval/Treat Not Completed: Other (comment).  Per RN, decision has been made for comfort care moving forward.  PT is signing off.   Joslyn Hy PT, DPT [phone] Pager: [phone] November 30, 2015, 9:32 AM

## 2015-11-30 NOTE — Discharge Summary (Signed)
Stroke Discharge Summary  Patient ID: Logan Allen   MRN: Logan Allen      DOB: 10-02-1924  Date of Admission: 11/06/2015 Date of Discharge: 20-Dec-2015  Attending Physician:  No att. providers found, Stroke MD Consulting Physician(s):     pulmonary/intensive care, interventional radiology Patient's PCP:  Henrine Screws, MD  DISCHARGE DIAGNOSIS:  Active Problems:   Cerebral infarction due to occlusion of left middle cerebral artery (HCC) s/p tPA and endovascular intervention, with post op hemorrhagic transformation   Acute respiratory failure ([place])   Cerebrovascular accident (CVA) due to thrombosis of cerebral artery ([place])   Coarctation of aorta, recurrent, post-intervention   ICH (intracerebral hemorrhage) ([place])   Atrial fibrillation with RVR ([place])   BMI: Body mass index is 28.96 kg/(m^2).  Past Medical History  Diagnosis Date  . Coronary artery disease   . Hypertension   . High cholesterol   . COPD (chronic obstructive pulmonary disease) ([place])   . Exertional dyspnea 08/20/2012  . History of blood transfusion 1994; 2004    "w/both bypass ORs"  . History of carotid endarterectomy 1994    right  . Recurrent falls     "last fall 08/15/2012" (08/20/2012)  . Arthritis     "I suffer all over my body; dr said it could be from arthritis" (08/20/2012)  . Chronic lower back pain   . Anxiety   . Mental disorder   . Prostate cancer [facility]) 1988    radiation treatment  . Hypothyroidism   . OSA (obstructive sleep apnea)   . Gout     "thumb" (08/20/2012)  . Incontinence of urine    Past Surgical History  Procedure Laterality Date  . Appendectomy    . Cataract extraction w/ intraocular lens  implant, bilateral    . Coronary artery bypass graft  1994; 2004  . Prostatectomy    . Hernia repair      UHR  . Cardiac valve replacement  2004    AVR  . Orchiectomy  2001    bilateral  . Penile prosthesis  removal  2001  . Circumcision  2001  . Radiology with anesthesia N/A  11/27/2015    Procedure: RADIOLOGY WITH ANESTHESIA;  Surgeon: Luanne Bras, MD;  Location: [place];  Service: Radiology;  Laterality: N/A;      Medication List    ASK your doctor about these medications        allopurinol 100 MG tablet  Commonly known as:  ZYLOPRIM  Take 100 mg by mouth daily.     ASMANEX HFA 100 MCG/ACT Aero  Generic drug:  Mometasone Furoate  Inhale 2 puffs into the lungs 2 (two) times daily as needed (shortness of breath/ wheezing).     aspirin 81 MG chewable tablet  Chew 81 mg by mouth daily.     bimatoprost 0.01 % Soln  Commonly known as:  LUMIGAN  Place 1 drop into both eyes at bedtime.     calcium-vitamin D 500-200 MG-UNIT tablet  Commonly known as:  OSCAL WITH D  Take 1 tablet by mouth daily.     cilostazol 50 MG tablet  Commonly known as:  PLETAL  Take 50 mg by mouth 2 (two) times daily before a meal. 7:30am and 4:30pm     clonazePAM 2 MG tablet  Commonly known as:  KLONOPIN  Take 2 mg by mouth at bedtime.     finasteride 5 MG tablet  Commonly known as:  PROSCAR  Take 5 mg  by mouth daily.     HYDROcodone-acetaminophen 5-325 MG tablet  Commonly known as:  NORCO/VICODIN  Take 1 tablet by mouth every 12 (twelve) hours as needed for moderate pain.     Melatonin 5 MG Tabs  Take 5 mg by mouth at bedtime.     memantine 10 MG tablet  Commonly known as:  NAMENDA  Take 10 mg by mouth 2 (two) times daily. 9am, 9pm     metoprolol succinate 25 MG 24 hr tablet  Commonly known as:  TOPROL-XL  Take 25 mg by mouth daily.     ondansetron 4 MG disintegrating tablet  Commonly known as:  ZOFRAN ODT  4mg  ODT q4 hours prn nausea/vomit     oxybutynin 5 MG tablet  Commonly known as:  DITROPAN  Take 5 mg by mouth daily.     polyvinyl alcohol 1.4 % ophthalmic solution  Commonly known as:  LIQUIFILM TEARS  Place 1 drop into both eyes 2 (two) times daily as needed for dry eyes.     venlafaxine 75 MG tablet  Commonly known as:  EFFEXOR  Take 37.5 mg  by mouth daily.     vitamin B-12 1000 MCG tablet  Commonly known as:  CYANOCOBALAMIN  Take 1,000 mcg by mouth daily.        LABORATORY STUDIES CBC    Component Value Date/Time   WBC 18.6* 11/28/2015 0555   RBC 2.80* 11/28/2015 0555   HGB 7.6* 11/28/2015 0555   HCT 22.5* 11/28/2015 0555   PLT  11/28/2015 0555    PLATELET CLUMPS NOTED ON SMEAR, COUNT APPEARS ADEQUATE   MCV 80.4 11/28/2015 0555   MCH 27.1 11/28/2015 0555   MCHC 33.8 11/28/2015 0555   RDW 15.9* 11/28/2015 0555   LYMPHSABS 1.4 11/26/2015 0603   MONOABS 1.5* 11/26/2015 0603   EOSABS 0.0 11/26/2015 0603   BASOSABS 0.0 11/26/2015 0603   CMP    Component Value Date/Time   NA 139 11/28/2015 0555   K 3.3* 11/28/2015 0555   CL 107 11/28/2015 0555   CO2 22 11/28/2015 0555   GLUCOSE 144* 11/28/2015 0555   BUN 14 11/28/2015 0555   CREATININE 0.78 11/28/2015 0555   CALCIUM 7.7* 11/28/2015 0555   PROT 6.6 11/06/2015 1001   ALBUMIN 2.1* 11/28/2015 0555   AST 21 11/12/2015 1001   ALT 10* 11/16/2015 1001   ALKPHOS 101 11/20/2015 1001   BILITOT 0.4 11/26/2015 1001   GFRNONAA >60 11/28/2015 0555   GFRAA >60 11/28/2015 0555   COAGS Lab Results  Component Value Date   INR 1.01 11/11/2015   INR 1.10 08/20/2012   INR 1.33 07/17/2010   Lipid Panel    Component Value Date/Time   CHOL 112 11/26/2015 0603   TRIG 84 11/28/2015 0555   HDL 28* 11/26/2015 0603   CHOLHDL 4.0 11/26/2015 0603   VLDL 31 11/26/2015 0603   LDLCALC 53 11/26/2015 0603   HgbA1C  Lab Results  Component Value Date   HGBA1C 5.9* 11/26/2015   Cardiac Panel (last 3 results)  Recent Labs  11/28/15 0555 11/28/15 1147  TROPONINI 0.30* 0.08*   Urinalysis    Component Value Date/Time   COLORURINE YELLOW 11/03/2015 1100   APPEARANCEUR CLOUDY* 11/24/2015 1100   LABSPEC 1.022 11/05/2015 1100   PHURINE 7.5 11/24/2015 1100   GLUCOSEU NEGATIVE 11/04/2015 1100   HGBUR NEGATIVE 11/01/2015 1100   BILIRUBINUR NEGATIVE 11/01/2015 1100    KETONESUR NEGATIVE 11/27/2015 1100   PROTEINUR NEGATIVE 11/03/2015 1100   UROBILINOGEN  1.0 03/23/2015 1916   NITRITE NEGATIVE 11/13/2015 1100   LEUKOCYTESUR MODERATE* 11/20/2015 1100   Urine Drug Screen No results found for: LABOPIA, COCAINSCRNUR, LABBENZ, AMPHETMU, THCU, LABBARB  Alcohol Level No results found for: [place] I have personally reviewed the radiological images below and agree with the radiology interpretations.  Ct Angio Head and Neck W/cm &/or Wo Cm 11/18/2015  1. Occlusion of the left middle cerebral artery at the bifurcation. No posterior branch vessels fill. There are poor collaterals.  2. Minimal opacification of anterior left MCA branch vessels.  3. Large posterior left MCA territory developing infarct without hemorrhage.  4. Mild attenuation of anterior MCA branch vessels on the right.  5. Atherosclerotic calcifications at the aortic arch in carotid bifurcations bilaterally is well is the cavernous internal carotid arteries without significant stenoses.  6. Less than 50% stenosis of the left vertebral artery at the dural margin. Other atherosclerotic changes are present in the vertebral arteries bilaterally without significant stenoses.  7. Multilevel spondylosis of the cervical spine.  8. Emphysema and bilateral pleural effusions, right greater than left with associated atelectasis.   Ct Head Wo Contrast 11/19/2015  Extensive contrast staining in the left temporal and parietal lobes is consistent with reperfusion after interventional procedure. Ovoid collection of contrast the anterior left temporal lobe may represent more intense contrast staining but coexistent hemorrhage is possible. Evolution of left MCA territory infarct. Atrophy and chronic microvascular ischemic change.   Ct Head Wo Contrast 11/17/2015  1. No acute intracranial pathology.  2. Chronic microvascular disease and cerebral atrophy.   11/28/2015  IMPRESSION: Large left middle cerebral artery distribution infarct with progressive swelling. Associated hemorrhage most notable left temporal lobe, left operculum region and within the left sylvian fissure appears grossly similar to the MR and improved from the prior CT. 5.4 mm midline shift to the right versus prior 3 mm midline shift.   MRI of the brain without contrast - Acute infarction throughout the majority of the left middle cerebral artery territory as described above. Punctate acute infarctions elsewhere scattered within the cerebellum and both cerebral hemispheres. Mild swelling in the region of the left MCA infarction but without shift at this time. 1.5 x 3 cm hematoma in the anterior temporal lobe on the left with a 12 mm Sever's hematoma just lateral to that.  Cerebral angio S/P lt common carotid arteriogram,followed by complete revascularization of Lt MCA occlusion using x 2 passes with 40mm x 20 mm trevoprovue and x 1 pass with 4 mmx 40 mm solitaire FR device and 9 mg of superselective IA tpa with near complete revascularization.TICI 2b flow restored  Dg Chest Port 1 View 11/27/15 Endotracheal tube and nasogastric tube in appropriate position. Mild bibasilar atelectasis, without significant change.  11/26/2015  Minimal bibasilar atelectasis.   11/09/2015  No acute cardiopulmonary disease. Appropriate position of endotracheal tube.   2D echo - - Left ventricle: The cavity size was normal. There was mild concentric hypertrophy. Systolic function was vigorous. The estimated ejection fraction was in the range of 65% to 70%. Wall motion was normal; there were no regional wall motion abnormalities. Features are consistent with a pseudonormal left ventricular filling pattern, with concomitant abnormal relaxation and increased filling pressure (grade 2 diastolic dysfunction). Doppler parameters are consistent with elevated ventricular end-diastolic filling  pressure. - Aortic valve: A bioprosthetic valve sits well in the aortic position. There is no central aortic regurgitation or parvalvular leak. Transaortic gradients are normal and unchanged  from prior. - Aortic root: The aortic root was normal in size. - Mitral valve: Severele calcified annulus. Mildly thickened leaflets . The findings are consistent with mild to moderate stenosis. There was mild regurgitation. Valve area by pressure half-time: 2.47 cm^2. - Left atrium: The atrium was moderately dilated. - Right ventricle: Systolic function was normal. - Right atrium: The atrium was mildly dilated. - Tricuspid valve: There was moderate regurgitation. - Pulmonary arteries: Systolic pressure was mildly increased. PA peak pressure: 37 mm Hg (S). - Inferior vena cava: The vessel was dilated. The respirophasic diameter changes were blunted (< 50%), consistent with elevated central venous pressure. - Pericardium, extracardiac: There was no pericardial effusion.    HISTORY OF PRESENT ILLNESS Logan Allen is an 82 y.o. male patient who was brought in by the EMTs with altered mental status, acute onset of right sided weakness and left gaze deviation. Patient lives in an assisted living facility. Last seen normal was at 7 AM when he was noted to be walking with a walker.  The facility staff found him with the right-sided weakness past 9 AM. After that, EMTs were called in and was transported to the ER. Patient's family was not immediately available to initial evaluation but her daughter came into the ER about 30 minutes later. Her daughter reported that patient was diagnosed with atrial fibrillation at least 1-2 years ago but hasn't been on any anticoagulation. He is just on aspirin daily. No history of prior intracerebral hemorrhage, or any recent bleeding or surgeries or head trauma.   Date last known well: 11/05/2015 Time last known well: 0700 tPA Given: Yes   HOSPITAL  COURSE Logan Allen is a 82 y.o. male with history of CAD, HTN, afib not on AC, HLD, COPD, s/p right CEA, multiple falls, mental disorder, OSA, and gout presenting with acute onset of AMS, right-sided weakness and left gaze deviation. Received IV TPA and status post percutaneous thrombectomy.  Stroke: Left MCA large infarct with hemorrhagic transformation, right MCA and ACA punctate infarcts, status post TPA and endovascular thrombectomy, due to A. fib not on AC.  Resultant intubated, right hemiplegia  MRI - left MCA large infarct with hemorrhagic transformation, right MCA and ACA punctate infarcts  CTA of head and neck - left M1 occlusion.  Repeat CT showed large left MCA infarct with improving hemorrhagic transformation.  2D Echo - EF 65-70%  LDL - 53  HgbA1c 5.9  Diet NPO time specified  aspirin 81 mg daily prior to admission, now on No antithrombotic secondary to hemorrhagic transformation.   Family requested comfort care measure as per pt wife given the extensive of stroke and poor prognosis.  Persistent A. fib with RVR  s/p cardioversion  Still tachycardia but improved from yesterday  Anemia   Received PRBC transfusion   Hb 6.4 ->9.7->8.1->7.6  UTI  UA WBC TNTC  Leukocytosis WBC 24.9->21.3->18.6  Hypertension  Stable  BP goal < 160 due to hemorrhagic transformation   Cardene drip when necessary  Other Stroke Risk Factors  Advanced age  Cigarette smoker, quit smoking.  Obstructive sleep apnea  S/p right CEA  Other Active Problems  Elevated triglycerides  Hypokalemia - supplement  Gout   COPD  DISCHARGE EXAM Pt deceased.   30 minutes were spent preparing discharge.  Rosalin Hawking, MD PhD Stroke Neurology 12-28-2015 10:34 PM

## 2015-11-30 NOTE — Anesthesia Postprocedure Evaluation (Signed)
Anesthesia Post Note  Patient: Logan Allen  Procedure(s) Performed: Procedure(s) (LRB): RADIOLOGY WITH ANESTHESIA (N/A)  Patient location during evaluation: ICU Anesthesia Type: General Level of consciousness: sedated Pain management: pain level controlled Vital Signs Assessment: post-procedure vital signs reviewed and stable Respiratory status: patient remains intubated per anesthesia plan Cardiovascular status: stable Postop Assessment: no signs of nausea or vomiting Anesthetic complications: no Comments: Large hematoma of left arm from attempted arterial access despite pressure dressing, pulse present    Last Vitals:  Filed Vitals:   2015-12-11 1250 12/11/2015 1655  BP: 151/85 72/33  Pulse: 137 64  Temp:    Resp: 30 6    Last Pain: There were no vitals filed for this visit.               Michaelia Beilfuss

## 2015-11-30 NOTE — Care Management Note (Addendum)
Case Management Note  Patient Details  Name: ARION BUFKIN MRN: [id number] Date of Birth: 1924-07-05  Subjective/Objective:    Pt admitted on 11/21/2015 s/p stroke with TPA and thrombectomy.  PTA, pt resided at an ALF.                   Action/Plan: Family has decided to offer comfort measures; pt extubated earlier today.    Expected Discharge Date:                  Expected Discharge Plan:  [place]  In-House Referral:     Discharge planning Services  CM Consult  Post Acute Care Choice:    Choice offered to:     DME Arranged:    DME Agency:     HH Arranged:    [place] Agency:     Status of Service:  In process, will continue to follow  Medicare Important Message Given:  Yes Date Medicare IM Given:    Medicare IM give by:    Date Additional Medicare IM Given:    Additional Medicare Important Message give by:     If discussed at [place] of Stay Meetings, dates discussed:    Additional Comments:  Reinaldo Raddle, RN, BSN  Trauma/Neuro ICU Case Manager [phone]

## 2015-11-30 NOTE — Progress Notes (Signed)
SLP Cancellation Note  Patient Details Name: Logan Allen MRN: [id number] DOB: 03/18/1924   Cancelled treatment:       Reason Eval/Treat Not Completed: Other (comment) Pt transitioning to comfort care.  Will sign off.   Juan Quam Laurice 2015/12/12, 10:14 AM

## 2015-11-30 NOTE — Progress Notes (Addendum)
STROKE TEAM PROGRESS NOTE   SUBJECTIVE (INTERVAL HISTORY) Daughter and son in law are at the bedside.  He is still intubated, off sedation. Able to pen eyes on voice but not following commands, still very lethargic. Due to large extensive stroke and poor prognosis, daughter has made decision for comfort care as per pt wishes. Will do comfort care.  OBJECTIVE Temp:  [98 F (36.7 C)-99.1 F (37.3 C)] 98.5 F (36.9 C) (01/31 0400) Pulse Rate:  [92-137] 137 (01/31 1250) Cardiac Rhythm:  [-] Sinus tachycardia (01/31 0800) Resp:  [11-30] 30 (01/31 1250) BP: (122-176)/(51-85) 151/85 mmHg (01/31 1250) SpO2:  [90 %-100 %] 90 % (01/31 1250) FiO2 (%):  [30 %] 30 % (01/31 1100) Weight:  [196 lb 3.4 oz (89 kg)] 196 lb 3.4 oz (89 kg) (01/31 0455)  CBC:  Recent Labs Lab 11/10/2015 1001  11/26/15 0603 11/27/15 0415 11/28/15 0555  WBC 9.8  < > 20.5* 21.3* 18.6*  NEUTROABS 6.2  --  17.6*  --   --   HGB 10.8*  < > 9.7* 8.1* 7.6*  HCT 34.7*  < > 28.7* 24.7* 22.5*  MCV 78.7  < > 80.8 80.2 80.4  PLT 182  < > 149* 113* PLATELET CLUMPS NOTED ON SMEAR, COUNT APPEARS ADEQUATE  < > = values in this interval not displayed.  Basic Metabolic Panel:   Recent Labs Lab 11/27/15 0415 11/28/15 0555  NA 137 139  K 3.9 3.3*  CL 105 107  CO2 22 22  GLUCOSE 138* 144*  BUN 16 14  CREATININE 1.05 0.78  CALCIUM 7.7* 7.7*  MG 2.0 2.1  PHOS 2.4* 1.5*    Lipid Panel:     Component Value Date/Time   CHOL 112 11/26/2015 0603   TRIG 84 11/28/2015 0555   HDL 28* 11/26/2015 0603   CHOLHDL 4.0 11/26/2015 0603   VLDL 31 11/26/2015 0603   LDLCALC 53 11/26/2015 0603   HgbA1c:  Lab Results  Component Value Date   HGBA1C 5.9* 11/26/2015   Urine Drug Screen: No results found for: LABOPIA, COCAINSCRNUR, LABBENZ, AMPHETMU, THCU, LABBARB    IMAGING I have personally reviewed the radiological images below and agree with the radiology interpretations.  Ct Angio Head and Neck W/cm &/or Wo Cm 11/23/2015   1.  Occlusion of the left middle cerebral artery at the bifurcation. No posterior branch vessels fill. There are poor collaterals.  2. Minimal opacification of anterior left MCA branch vessels.  3. Large posterior left MCA territory developing infarct without hemorrhage.  4. Mild attenuation of anterior MCA branch vessels on the right.  5. Atherosclerotic calcifications at the aortic arch in carotid bifurcations bilaterally is well is the cavernous internal carotid arteries without significant stenoses.  6. Less than 50% stenosis of the left vertebral artery at the dural margin. Other atherosclerotic changes are present in the vertebral arteries bilaterally without significant stenoses.  7. Multilevel spondylosis of the cervical spine.  8. Emphysema and bilateral pleural effusions, right greater than left with associated atelectasis.   Ct Head Wo Contrast 11/26/2015   Extensive contrast staining in the left temporal and parietal lobes is consistent with reperfusion after interventional procedure. Ovoid collection of contrast the anterior left temporal lobe may represent more intense contrast staining but coexistent hemorrhage is possible. Evolution of left MCA territory infarct. Atrophy and chronic microvascular ischemic change.   Ct Head Wo Contrast 11/27/2015   1. No acute intracranial pathology.  2. Chronic microvascular disease and cerebral atrophy.  11/28/2015  IMPRESSION: Large left middle cerebral artery distribution infarct with progressive swelling. Associated hemorrhage most notable left temporal lobe, left operculum region and within the left sylvian fissure appears grossly similar to the MR and improved from the prior CT. 5.4 mm midline shift to the right versus prior 3 mm midline shift.   MRI of the brain without contrast - Acute infarction throughout the majority of the left middle cerebral artery territory as described above. Punctate acute infarctions elsewhere scattered within the  cerebellum and both cerebral hemispheres. Mild swelling in the region of the left MCA infarction but without shift at this time. 1.5 x 3 cm hematoma in the anterior temporal lobe on the left with a 12 mm Sever's hematoma just lateral to that.  Cerebral angio S/P lt common carotid arteriogram,followed by complete revascularization of Lt MCA occlusion using x 2 passes with 61mm x 20 mm trevoprovue and x 1 pass with 4 mmx 40 mm solitaire FR device and 9 mg of superselective IA tpa with near complete revascularization.TICI 2b flow restored  Dg Chest Port 1 View 11/27/15 Endotracheal tube and nasogastric tube in appropriate position. Mild bibasilar atelectasis, without significant change.  11/26/2015   Minimal bibasilar atelectasis.   11/14/2015   No acute cardiopulmonary disease. Appropriate position of endotracheal tube.   2D echo - - Left ventricle: The cavity size was normal. There was mild concentric hypertrophy. Systolic function was vigorous. The estimated ejection fraction was in the range of 65% to 70%. Wall motion was normal; there were no regional wall motion abnormalities. Features are consistent with a pseudonormal left ventricular filling pattern, with concomitant abnormal relaxation and increased filling pressure (grade 2 diastolic dysfunction). Doppler parameters are consistent with elevated ventricular end-diastolic filling pressure. - Aortic valve: A bioprosthetic valve sits well in the aortic position. There is no central aortic regurgitation or parvalvular leak. Transaortic gradients are normal and unchanged from prior. - Aortic root: The aortic root was normal in size. - Mitral valve: Severele calcified annulus. Mildly thickened leaflets . The findings are consistent with mild to moderate stenosis. There was mild regurgitation. Valve area by pressure half-time: 2.47 cm^2. - Left atrium: The atrium was moderately dilated. - Right ventricle:  Systolic function was normal. - Right atrium: The atrium was mildly dilated. - Tricuspid valve: There was moderate regurgitation. - Pulmonary arteries: Systolic pressure was mildly increased. PA peak pressure: 37 mm Hg (S). - Inferior vena cava: The vessel was dilated. The respirophasic diameter changes were blunted (< 50%), consistent with elevated central venous pressure. - Pericardium, extracardiac: There was no pericardial effusion.   PHYSICAL EXAM  Temp:  [98 F (36.7 C)-99.1 F (37.3 C)] 98.5 F (36.9 C) (01/31 0400) Pulse Rate:  [92-137] 137 (01/31 1250) Resp:  [11-30] 30 (01/31 1250) BP: (122-176)/(51-85) 151/85 mmHg (01/31 1250) SpO2:  [90 %-100 %] 90 % (01/31 1250) FiO2 (%):  [30 %] 30 % (01/31 1100) Weight:  [196 lb 3.4 oz (89 kg)] 196 lb 3.4 oz (89 kg) (01/31 0455)  General - Well nourished, well developed, intubated, off sedation and pressor.  Ophthalmologic - Fundi not visualized due to small pupils.  Cardiovascular - Regular rhythm,  but tachycardia.  Neuro -  intubated, off sedation, briefly open eyes on voice, but not following commands. PERRL, mid position, doll's eyes positive, weak corneal bilaterally, positive gag reflex. Left upper extremity localizes to pain, left lower extremity withdraw to pain stimulation but not against gravity. Right upper extremity extension to pain and  lower extremity mild withdrawal to pain stimulation. Bilateral Babinski positive.  sensation, coordination or gait cannot be tested.  ASSESSMENT/PLAN [name] is a 82 y.o. male with history of CAD, HTN, afib not on AC, HLD, COPD, s/p right CEA, multiple falls, mental disorder, OSA, and gout presenting with acute onset of AMS, right-sided weakness and left gaze deviation. Received IV TPA and status post percutaneous thrombectomy.  Stroke:  Left MCA large infarct with hemorrhagic transformation, right MCA and ACA punctate infarcts, status post TPA and endovascular  thrombectomy, due to A. fib not on AC.  Resultant  intubated, right hemiplegia  MRI - left MCA large infarct with hemorrhagic transformation, right MCA and ACA punctate infarcts  CTA of head and neck -  left M1 occlusion.  Repeat CT showed large left MCA infarct with improving hemorrhagic transformation.  2D Echo - EF 65-70%  LDL - 53  HgbA1c 5.9 Diet NPO time specified  aspirin 81 mg daily prior to admission, now on No antithrombotic secondary to hemorrhagic transformation.   Family requested comfort care measure as per pt wife given the extensive of stroke and poor prognosis.  Persistent A. fib with RVR  s/p cardioversion  Still tachycardia but improved from yesterday  Anemia   Received PRBC  transfusion   Hb 6.4 ->9.7->8.1->7.6  UTI  UA WBC TNTC  Leukocytosis WBC 24.9->21.3->18.6  Hypertension  Stable BP goal < 160 due to hemorrhagic transformation  Cardene drip when necessary  Other Stroke Risk Factors  Advanced age  Cigarette smoker, quit smoking.  Obstructive sleep apnea  S/p right CEA  Other Active Problems  Elevated triglycerides  Hypokalemia - supplement  Gout   COPD  Hospital day # 4   I had long discussion with daughter and son in law at bedside. She stated that her father would not want aggressive measure with trach, PEG or NH placement. They requested comfort care measures give the extensive stroke and poor prognosis. Will extubate and initiate comfort care measure.   Rosalin Hawking, MD PhD Stroke Neurology 12-25-2015 3:09 PM   To contact Stroke Continuity provider, please refer to [email or web address] After hours, contact General Neurology

## 2015-11-30 NOTE — Progress Notes (Signed)
OT Discharge NOTE  Patient Details Name: JAVIEON ENCISO MRN: [id number] DOB: Sep 12, 1924   Cancelled Treatment:    Reason Eval/Treat Not Completed: Other (comment) (comfort care signing off)  Vonita Moss   OTR/L Pager: [phone] Office: [phone] .  12-22-2015, 7:29 AM

## 2015-11-30 NOTE — Anesthesia Preprocedure Evaluation (Signed)
Anesthesia Evaluation  Patient identified by MRN, date of birth, ID band Patient confused    Reviewed: Unable to perform ROS - Chart review onlyPreop documentation limited or incomplete due to emergent nature of procedure.  Airway Mallampati: III       Dental   Pulmonary former smoker,    breath sounds clear to auscultation       Cardiovascular hypertension, Pt. on medications and Pt. on home beta blockers + angina + CAD and + Peripheral Vascular Disease   Rhythm:Irregular     Neuro/Psych CVA    GI/Hepatic   Endo/Other  Hypothyroidism   Renal/GU Renal disease     Musculoskeletal   Abdominal   Peds  Hematology  (+) anemia ,   Anesthesia Other Findings   Reproductive/Obstetrics                             Anesthesia Physical Anesthesia Plan  ASA: IV and emergent  Anesthesia Plan: General   Post-op Pain Management:    Induction: Intravenous  Airway Management Planned: Oral ETT  Additional Equipment: Arterial line  Intra-op Plan:   Post-operative Plan: Post-operative intubation/ventilation  Informed Consent:   Only emergency history available  Plan Discussed with: Surgeon and CRNA  Anesthesia Plan Comments:         Anesthesia Quick Evaluation

## 2015-11-30 NOTE — Progress Notes (Signed)
Pt made comfort care at 11:30 this morning. Time of death 5. Gilford Rile RN and myself auscultated no heart sounds and no lung sounds. MD notified. CDS notified but pt is not a donor. Not medical examiners case. Daughter in the room at time of death. Received information from her. Will prepare pt for morgue.

## 2015-11-30 NOTE — Procedures (Signed)
Extubation Procedure Note  Patient Details:   Name: Logan Allen DOB: 11-06-1923 MRN: [id number]   Airway Documentation:     Evaluation  O2 sats: stable throughout Complications: No apparent complications Patient did tolerate procedure well. Bilateral Breath Sounds: Clear   No   Pt. was terminally extubated to RA for comfort care with RN & daughter at the bedside.   Tyrelle Raczka, Eddie North 12-03-15, 11:39 AM

## 2015-11-30 DEATH — deceased
